# Patient Record
Sex: Male | Born: 1940 | Race: White | Hispanic: No | Marital: Married | Smoking: Never smoker
Health system: Southern US, Community
[De-identification: ages and names within clinical notes are randomized; demographics above are authoritative.]

## PROBLEM LIST (undated history)

## (undated) DIAGNOSIS — I639 Cerebral infarction, unspecified: Secondary | ICD-10-CM

## (undated) DIAGNOSIS — R0600 Dyspnea, unspecified: Secondary | ICD-10-CM

## (undated) DIAGNOSIS — C679 Malignant neoplasm of bladder, unspecified: Secondary | ICD-10-CM

## (undated) DIAGNOSIS — L309 Dermatitis, unspecified: Secondary | ICD-10-CM

## (undated) DIAGNOSIS — I2510 Atherosclerotic heart disease of native coronary artery without angina pectoris: Secondary | ICD-10-CM

## (undated) DIAGNOSIS — M1990 Unspecified osteoarthritis, unspecified site: Secondary | ICD-10-CM

## (undated) DIAGNOSIS — J45909 Unspecified asthma, uncomplicated: Secondary | ICD-10-CM

## (undated) DIAGNOSIS — I10 Essential (primary) hypertension: Secondary | ICD-10-CM

## (undated) DIAGNOSIS — U071 COVID-19: Secondary | ICD-10-CM

## (undated) DIAGNOSIS — J189 Pneumonia, unspecified organism: Secondary | ICD-10-CM

## (undated) DIAGNOSIS — G4730 Sleep apnea, unspecified: Secondary | ICD-10-CM

## (undated) HISTORY — PX: ANKLE SURGERY: SHX546

## (undated) HISTORY — PX: COLONOSCOPY: SHX174

## (undated) HISTORY — PX: PROSTATE SURGERY: SHX751

## (undated) HISTORY — PX: TONSILLECTOMY: SUR1361

## (undated) HISTORY — PX: REPLACEMENT TOTAL KNEE BILATERAL: SUR1225

## (undated) HISTORY — PX: BACK SURGERY: SHX140

## (undated) HISTORY — PX: MOHS SURGERY: SUR867

---

## 1958-10-01 HISTORY — PX: BACK SURGERY: SHX140

## 2008-10-01 DIAGNOSIS — C801 Malignant (primary) neoplasm, unspecified: Secondary | ICD-10-CM

## 2008-10-01 HISTORY — DX: Malignant (primary) neoplasm, unspecified: C80.1

## 2010-10-01 HISTORY — PX: PROSTATE SURGERY: SHX751

## 2011-09-17 ENCOUNTER — Ambulatory Visit: Payer: Self-pay | Admitting: General Practice

## 2011-10-03 ENCOUNTER — Inpatient Hospital Stay: Payer: Self-pay | Admitting: General Practice

## 2011-10-04 LAB — BASIC METABOLIC PANEL
Anion Gap: 11 (ref 7–16)
BUN: 14 mg/dL (ref 7–18)
Calcium, Total: 8.5 mg/dL (ref 8.5–10.1)
Chloride: 100 mmol/L (ref 98–107)
Co2: 29 mmol/L (ref 21–32)
Osmolality: 282 (ref 275–301)

## 2011-10-04 LAB — PLATELET COUNT: Platelet: 207 10*3/uL (ref 150–440)

## 2011-10-05 LAB — BASIC METABOLIC PANEL
BUN: 18 mg/dL (ref 7–18)
Co2: 30 mmol/L (ref 21–32)
Creatinine: 0.94 mg/dL (ref 0.60–1.30)
EGFR (Non-African Amer.): >60
Glucose: 114 mg/dL — ABNORMAL HIGH (ref 65–99)
Potassium: 3.3 mmol/L — ABNORMAL LOW (ref 3.5–5.1)
Sodium: 141 mmol/L (ref 136–145)

## 2011-10-05 LAB — HEMOGLOBIN: HGB: 11.4 g/dL — ABNORMAL LOW (ref 13.0–18.0)

## 2011-10-05 LAB — PLATELET COUNT: Platelet: 162 10*3/uL (ref 150–440)

## 2011-10-06 LAB — BASIC METABOLIC PANEL
BUN: 13 mg/dL (ref 7–18)
Chloride: 100 mmol/L (ref 98–107)
EGFR (African American): >60
EGFR (Non-African Amer.): >60
Glucose: 95 mg/dL (ref 65–99)
Osmolality: 279 (ref 275–301)
Potassium: 3.2 mmol/L — ABNORMAL LOW (ref 3.5–5.1)
Sodium: 140 mmol/L (ref 136–145)

## 2012-02-05 ENCOUNTER — Ambulatory Visit: Payer: Self-pay | Admitting: General Practice

## 2012-02-05 LAB — URINALYSIS, COMPLETE
Bilirubin,UR: NEGATIVE
Glucose,UR: NEGATIVE mg/dL (ref 0–75)
Leukocyte Esterase: NEGATIVE
Protein: NEGATIVE
RBC,UR: 2 /HPF (ref 0–5)
Squamous Epithelial: 1
WBC UR: 1 /HPF (ref 0–5)

## 2012-02-05 LAB — BASIC METABOLIC PANEL
Anion Gap: 7 (ref 7–16)
BUN: 18 mg/dL (ref 7–18)
Calcium, Total: 9.4 mg/dL (ref 8.5–10.1)
Chloride: 103 mmol/L (ref 98–107)
Creatinine: 0.69 mg/dL (ref 0.60–1.30)
EGFR (African American): >60
EGFR (Non-African Amer.): >60
Glucose: 102 mg/dL — ABNORMAL HIGH (ref 65–99)
Osmolality: 283 (ref 275–301)
Potassium: 3.7 mmol/L (ref 3.5–5.1)

## 2012-02-05 LAB — CBC
MCH: 30 pg (ref 26.0–34.0)
MCV: 89 fL (ref 80–100)
Platelet: 225 10*3/uL (ref 150–440)
RBC: 4.74 10*6/uL (ref 4.40–5.90)
RDW: 14.2 % (ref 11.5–14.5)

## 2012-02-05 LAB — PROTIME-INR
INR: 0.9
Prothrombin Time: 12.9 secs (ref 11.5–14.7)

## 2012-02-05 LAB — APTT: Activated PTT: 31.4 secs (ref 23.6–35.9)

## 2012-02-05 LAB — SEDIMENTATION RATE: Erythrocyte Sed Rate: 4 mm/hr (ref 0–20)

## 2012-02-07 LAB — URINE CULTURE

## 2012-02-18 ENCOUNTER — Inpatient Hospital Stay: Payer: Self-pay | Admitting: General Practice

## 2012-02-19 LAB — BASIC METABOLIC PANEL
Anion Gap: 9 (ref 7–16)
Chloride: 105 mmol/L (ref 98–107)
EGFR (Non-African Amer.): >60
Glucose: 117 mg/dL — ABNORMAL HIGH (ref 65–99)
Osmolality: 282 (ref 275–301)
Sodium: 140 mmol/L (ref 136–145)

## 2012-02-19 LAB — HEMOGLOBIN: HGB: 12 g/dL — ABNORMAL LOW (ref 13.0–18.0)

## 2012-02-20 LAB — HEMOGLOBIN: HGB: 11.3 g/dL — ABNORMAL LOW (ref 13.0–18.0)

## 2012-02-20 LAB — BASIC METABOLIC PANEL
Calcium, Total: 7.7 mg/dL — ABNORMAL LOW (ref 8.5–10.1)
Chloride: 105 mmol/L (ref 98–107)
Co2: 29 mmol/L (ref 21–32)
Creatinine: 0.77 mg/dL (ref 0.60–1.30)
EGFR (Non-African Amer.): >60
Osmolality: 283 (ref 275–301)
Sodium: 141 mmol/L (ref 136–145)

## 2012-02-21 LAB — BASIC METABOLIC PANEL
Anion Gap: 9 (ref 7–16)
BUN: 13 mg/dL (ref 7–18)
Chloride: 101 mmol/L (ref 98–107)
Creatinine: 0.69 mg/dL (ref 0.60–1.30)
EGFR (African American): >60
EGFR (Non-African Amer.): >60
Glucose: 97 mg/dL (ref 65–99)
Osmolality: 279 (ref 275–301)
Potassium: 3.5 mmol/L (ref 3.5–5.1)
Sodium: 140 mmol/L (ref 136–145)

## 2012-06-23 DIAGNOSIS — N200 Calculus of kidney: Secondary | ICD-10-CM | POA: Insufficient documentation

## 2012-09-11 ENCOUNTER — Ambulatory Visit: Payer: Self-pay | Admitting: Internal Medicine

## 2012-12-18 ENCOUNTER — Ambulatory Visit: Payer: Self-pay | Admitting: Gastroenterology

## 2013-09-14 ENCOUNTER — Ambulatory Visit: Payer: Self-pay | Admitting: Specialist

## 2014-03-19 ENCOUNTER — Ambulatory Visit: Payer: Self-pay | Admitting: Gastroenterology

## 2014-03-22 LAB — PATHOLOGY REPORT

## 2014-07-12 ENCOUNTER — Inpatient Hospital Stay: Payer: Self-pay | Admitting: Internal Medicine

## 2014-07-12 LAB — COMPREHENSIVE METABOLIC PANEL
Albumin: 3 g/dL — ABNORMAL LOW (ref 3.4–5.0)
Alkaline Phosphatase: 52 U/L
Anion Gap: 7 (ref 7–16)
BUN: 25 mg/dL — AB (ref 7–18)
Bilirubin,Total: 0.4 mg/dL (ref 0.2–1.0)
CREATININE: 0.78 mg/dL (ref 0.60–1.30)
Calcium, Total: 7.7 mg/dL — ABNORMAL LOW (ref 8.5–10.1)
Chloride: 111 mmol/L — ABNORMAL HIGH (ref 98–107)
Co2: 27 mmol/L (ref 21–32)
GLUCOSE: 100 mg/dL — AB (ref 65–99)
Osmolality: 293 (ref 275–301)
POTASSIUM: 3.5 mmol/L (ref 3.5–5.1)
SGOT(AST): 21 U/L (ref 15–37)
SGPT (ALT): 25 U/L
Sodium: 145 mmol/L (ref 136–145)
TOTAL PROTEIN: 5.6 g/dL — AB (ref 6.4–8.2)

## 2014-07-12 LAB — CBC
HCT: 24.9 % — AB (ref 40.0–52.0)
HGB: 8.2 g/dL — ABNORMAL LOW (ref 13.0–18.0)
MCH: 31.8 pg (ref 26.0–34.0)
MCHC: 33 g/dL (ref 32.0–36.0)
MCV: 96 fL (ref 80–100)
Platelet: 189 10*3/uL (ref 150–440)
RBC: 2.58 10*6/uL — ABNORMAL LOW (ref 4.40–5.90)
RDW: 13.6 % (ref 11.5–14.5)
WBC: 7.1 10*3/uL (ref 3.8–10.6)

## 2014-07-12 LAB — HEMOGLOBIN: HGB: 7.5 g/dL — ABNORMAL LOW (ref 13.0–18.0)

## 2014-07-13 LAB — BASIC METABOLIC PANEL
Anion Gap: 7 (ref 7–16)
BUN: 23 mg/dL — AB (ref 7–18)
CALCIUM: 7.5 mg/dL — AB (ref 8.5–10.1)
Chloride: 112 mmol/L — ABNORMAL HIGH (ref 98–107)
Co2: 25 mmol/L (ref 21–32)
Creatinine: 0.71 mg/dL (ref 0.60–1.30)
Glucose: 96 mg/dL (ref 65–99)
Osmolality: 290 (ref 275–301)
Potassium: 3.5 mmol/L (ref 3.5–5.1)
Sodium: 144 mmol/L (ref 136–145)

## 2014-07-13 LAB — CBC WITH DIFFERENTIAL/PLATELET
BASOS ABS: 0.1 10*3/uL (ref 0.0–0.1)
Basophil %: 0.6 %
EOS ABS: 0.1 10*3/uL (ref 0.0–0.7)
EOS PCT: 1.4 %
HCT: 21.2 % — AB (ref 40.0–52.0)
HGB: 7.1 g/dL — AB (ref 13.0–18.0)
Lymphocyte #: 1.1 10*3/uL (ref 1.0–3.6)
Lymphocyte %: 13.5 %
MCH: 32.3 pg (ref 26.0–34.0)
MCHC: 33.6 g/dL (ref 32.0–36.0)
MCV: 96 fL (ref 80–100)
MONO ABS: 0.9 x10 3/mm (ref 0.2–1.0)
MONOS PCT: 10.6 %
Neutrophil #: 6.1 10*3/uL (ref 1.4–6.5)
Neutrophil %: 73.9 %
Platelet: 160 10*3/uL (ref 150–440)
RBC: 2.2 10*6/uL — AB (ref 4.40–5.90)
RDW: 13.7 % (ref 11.5–14.5)
WBC: 8.3 10*3/uL (ref 3.8–10.6)

## 2014-07-13 LAB — HEMOGLOBIN
HGB: 7.8 g/dL — AB (ref 13.0–18.0)
HGB: 8.2 g/dL — AB (ref 13.0–18.0)
HGB: 9 g/dL — AB (ref 13.0–18.0)

## 2014-07-14 LAB — CBC WITH DIFFERENTIAL/PLATELET
BASOS PCT: 0.9 %
Basophil #: 0.1 10*3/uL (ref 0.0–0.1)
EOS ABS: 0.2 10*3/uL (ref 0.0–0.7)
Eosinophil %: 3.2 %
HCT: 24 % — ABNORMAL LOW (ref 40.0–52.0)
HGB: 7.9 g/dL — ABNORMAL LOW (ref 13.0–18.0)
Lymphocyte #: 1.6 10*3/uL (ref 1.0–3.6)
Lymphocyte %: 25.1 %
MCH: 31.3 pg (ref 26.0–34.0)
MCHC: 32.8 g/dL (ref 32.0–36.0)
MCV: 95 fL (ref 80–100)
MONOS PCT: 13.7 %
Monocyte #: 0.8 x10 3/mm (ref 0.2–1.0)
NEUTROS ABS: 3.5 10*3/uL (ref 1.4–6.5)
NEUTROS PCT: 57.1 %
Platelet: 172 10*3/uL (ref 150–440)
RBC: 2.51 10*6/uL — ABNORMAL LOW (ref 4.40–5.90)
RDW: 13.9 % (ref 11.5–14.5)
WBC: 6.2 10*3/uL (ref 3.8–10.6)

## 2014-07-14 LAB — BASIC METABOLIC PANEL
Anion Gap: 5 — ABNORMAL LOW (ref 7–16)
BUN: 11 mg/dL (ref 7–18)
CALCIUM: 7.5 mg/dL — AB (ref 8.5–10.1)
CHLORIDE: 111 mmol/L — AB (ref 98–107)
CO2: 29 mmol/L (ref 21–32)
CREATININE: 0.69 mg/dL (ref 0.60–1.30)
EGFR (Non-African Amer.): >60
Glucose: 91 mg/dL (ref 65–99)
Osmolality: 288 (ref 275–301)
POTASSIUM: 3.5 mmol/L (ref 3.5–5.1)
SODIUM: 145 mmol/L (ref 136–145)

## 2014-07-14 LAB — HEMOGLOBIN: HGB: 8.8 g/dL — ABNORMAL LOW (ref 13.0–18.0)

## 2015-01-22 NOTE — Consult Note (Signed)
Brief Consult Note: Diagnosis: LGI bleed.   Patient was seen by consultant.   Consult note dictated.   Recommend further assessment or treatment.   Comments: Lower GI bleed.  Recent colonoscopy 03/2014 with left sided diverticulosis and one small TA.  This is likely diverticular bleed but seems to have more stuttering course than would be expected.   Recs: - obtain tagged scan if evidence of active bleeding overnight - follow hgb, transfuse for Hgb < 8 - monitor hemodynamics. - no plans for colonoscopy given low utility in stopping lower GI bleeds and recent colonoscopy 03/2014.  Electronic Signatures: Arther Dames (MD)  (Signed 12-Oct-15 17:21)  Authored: Brief Consult Note   Last Updated: 12-Oct-15 17:21 by Arther Dames (MD)

## 2015-01-22 NOTE — H&P (Signed)
PATIENT NAME:  David Jennings, SUR MR#:  [id number] DATE OF BIRTH:  05/13/1941  PRIMARY CARE PHYSICIAN:  [name], MD  CHIEF COMPLAINT: Rectal bleeding.   HISTORY OF PRESENTING ILLNESS: A 74 year old Caucasian male patient with history of diverticulosis, colon polyps on meloxicam daily. Presents to the Emergency Room, sent in by his primary care physician after he was noticed to have hypotension with systolic blood pressure into the 90s and tachycardic along with lightheadedness with rectal bleeding over 3 days. The patient mentions that he has had episodes of ups and downs, this starting 3 days back with significant rectal bleeding which seemed to be slowed down, but today he had large rectal bleeding along with lightheadedness, severe fatigue. He went to see his PCP as an urgent evaluation, was sent to the Emergency Room after his hemoglobin was found to be low at 8.8. His last known hemoglobin is 14.8 from December 2015.  He has not had any vomiting. He complains of some soreness in the left lower quadrant, but no pain. He did have a similar episode about 4 years back in [place] before he moved here when his hemoglobin was 5.5. Was given 2 units of packed RBCs and was admitted to the hospital.   The patient had a recent colonoscopy in June 2015 by Dr. Candace Cruise showing diverticulosis and colon polyps with adenomatous polyp, but no malignancy.   He does take aspirin at home, but has not taken this in 2 days since his bleeding started.   PAST MEDICAL HISTORY: 1.  Hypertension.  2.  Prostate cancer.  3.  Sleep apnea.  4.  Asthma.   PAST SURGICAL HISTORY: Bilateral knee replacements, prostate surgery, and lumbar fusion surgery.   SOCIAL HISTORY: The patient does not smoke. Rare alcohol use. No illicit drug use. Used to work for [employer] in the past, but presently is retired and moved from [place] 3 years back.   FAMILY HISTORY: No family history of colon cancer.   ALLERGIES: No known drug allergies.    HOME MEDICATIONS: Include: 1.  Aspirin 81 mg daily.  2.  Amlodipine 5 mg daily.  3.  Atrovent 1 puff inhaled 4 times a day as needed. 4.  Detrol LA 4 mg daily. 5.  Docusate 240 mg daily.  6.  Hydrochlorothiazide-losartan 25/100 one tablet once a day.  7.  Meloxicam 7.5 mg oral 2 times a day.  8.  Montelukast 10 mg oral once a day. 9.  Multivitamin 1 tablet daily.  10.  Niacin 500 mg 1 tablet oral once a day. 11.  Spiriva 18 mcg inhaled daily. 12.  Symbicort 2 puffs inhaled 2 times a day.   REVIEW OF SYSTEMS:   CONSTITUTIONAL:  Complains of severe fatigue, weakness.  EYES: No blurred vision, pain or redness.  ENT: No tinnitus, ear pain, hearing loss.  RESPIRATORY: No cough, wheeze, or hemoptysis.  CARDIOVASCULAR: No chest pain, orthopnea, edema.  GASTROINTESTINAL: No nausea, vomiting, diarrhea. Does have bright red blood per rectum along with some melena.  GENITOURINARY: No dysuria, hematuria, frequency.  ENDOCRINE: No polyuria, nocturia, thyroid problems.  HEMATOLOGIC AND LYMPHATIC: Does have acute blood loss anemia.  INTEGUMENTARY: No acne, rash, lesion.  MUSCULOSKELETAL: Has arthritis.  NEUROLOGIC: No focal numbness, weakness, seizure. Has generalized weakness.  PSYCHIATRIC: For anxiety or depression.   PHYSICAL EXAMINATION: VITAL SIGNS: Shows temperature 98.1, pulse of 95, blood pressure of 96/58, presently at 120/70, saturating 98% on room air.  GENERAL: Moderately built Caucasian male patient lying in bed,  seems comfortable, conversational, cooperative with exam. PSYCHIATRIC:  Alert and oriented x 3. Mood and affect appropriate. Judgment intact.  HEENT: Atraumatic, normocephalic. Oral mucosa moist and pink. External ears and nose normal. No pallor. No icterus. Pupils bilaterally equal and react to light.  NECK: Supple. No thyromegaly. No palpable lymph nodes. Trachea midline. No carotid bruit or JVD.  CARDIOVASCULAR: S1, S2, without any murmurs. Pulses 2+. No  edema. RESPIRATORY: Normal work of breathing, clear to auscultation on both sides. GASTROINTESTINAL: Soft abdomen, nontender. Bowel sounds present. No organomegaly palpable. SKIN:  Warm and dry. No petechiae, no rash, or ulcers.  MUSCULOSKELETAL:  No joint swelling, redness, effusion of large joints. Normal muscle tone.  NEUROLOGIC: Motor strength 5/5 in upper and lower extremities.  Sensory function intact all over.  LYMPHATIC: No cervical lymphadenopathy.   LABORATORY DATA: Showed glucose of 100, BUN 25, creatinine 0.78, sodium 142, potassium 3.5, chloride 111. AST, ALT, alkaline phosphatase, bilirubin normal. WBC 7.1, hemoglobin 8.2, platelets of 189,000 with MCV 96.   ASSESSMENT AND PLAN: 1.  Acute gastrointestinal bleed likely lower gastrointestinal bleed, but the patient does mention that he had an episode of melena and he is on meloxicam b.i.d. chronically. At this point most likely lower gastrointestinal bleed, but cannot rule out upper gastrointestinal bleed. Will check every 6 hour hemoglobin and bolus another liter of normal saline stat at this point secondary to his dizziness, tachycardia. I have obtained consent for blood transfusion if needed. We will type and screen, and hold 2 units of packed red blood cells. Case has been discussed with Dr. Rayann Heman. If he does have any further acute bleed, he will need a bleeding scan. Depending on his progress, he might need a colonoscopy or both colonoscopy and upper endoscopy. The patient will be placed on Protonix drip. Hold meloxicam and aspirin.  2.  Acute blood loss anemia secondary to the gastrointestinal bleed. Monitor closely, transfuse as needed.  3.  Hypertension. Hold medications secondary to his low blood pressure.  4.  Asthma. Continue home inhalers and nebulizer as needed.  5.  Deep vein thrombosis prophylaxis with sequential compression devices.  6. CODE STATUS: Full code.   Critical care time spent on this case was 40  minutes.   ____________________________ Leia Alf Meaghan Whistler, MD srs:LT D: 07/12/2014 18:15:23 ET T: 07/12/2014 19:00:20 ET JOB#: [id number]  cc: Alveta Heimlich R. Khristine Verno, MD, <Dictator> [name], MD Lupita Dawn. Candace Cruise, MD Neita Carp MD ELECTRONICALLY SIGNED 07/14/2014 17:04

## 2015-01-22 NOTE — Discharge Summary (Signed)
PATIENT NAME:  David Jennings, David Jennings MR#:  [id number] DATE OF BIRTH:  1941/06/30  DATE OF ADMISSION:  07/12/2014 DATE OF DISCHARGE:  07/14/2014  PRESENTING COMPLAINT: Rectal bleeding.   DISCHARGE DIAGNOSIS:  Suspected diverticular bleed.    CODE STATUS: Full code.   MEDICATIONS:  1. Hydrochlorothiazide/losartan 25/100 one p.o. daily.  2. Meloxicam 7.5 mg b.i.d.  3. Detrol LA 4 mg extended release p.o. daily at bedtime.  4. Singulair 10 mg daily.  5. Symbicort 160/4.5 two puffs b.i.d.  6. Spiriva 18 mcg inhalation 1 puff daily.  7. Amlodipine 5 mg daily.  8. Docusate 240 mg capsule p.o. daily.  9. Atrovent 1 puff 4 times a day as needed.  10. Ipratropium DuoNebs as needed.  11. Ipratropium nebulizer as needed.  12. Multivitamin p.o. daily.  13. Niacin 500 mg p.o. daily.  14. The patient advised to stop his aspirin until further instructions.   FOLLOWUP:   1.  Follow up with Dr. Baldemar Lenis in 1-2 weeks.  2.  Follow up with Dr. Rayann Heman in 2-4 weeks.   LABORATORY DATA:  Hemoglobin at discharge was 8.8.   HOSPITAL COURSE:  [name] is a 74 year old Caucasian gentleman who has history of asthma, high blood pressure admitted with:   1.  Lower GI bleed likely diverticular. EGD in 2014 was normal, colonoscopy June 2015 showed  large diverticula. Bleeding, scan was negative. The patient received 2 units, his hemoglobin was 8.8. Conservative management was opted. He was followed by Dr. Rayann Heman and will follow up with him as outpatient.  2.  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  Hemoglobin on admission was 8 and after 2 units came up to 8.8. Bleeding resolved, the patient was doing well.  3.  Hypertension. Resumed home medications.  4.  Asthma. Continue Symbicort, Singulair, and p.r.n. nebulizers as needed.  5.  DVT prophylaxis, TEDs and SCDs were used.   TIME SPENT: 40 minutes.      ____________________________ Hart Rochester Posey Pronto, MD sap:bu D: 07/19/2014 12:24:22  ET T: 07/19/2014 14:55:54 ET JOB#: [id number]  cc: Winfred Redel A. Posey Pronto, MD, <Dictator> Ilda Basset MD ELECTRONICALLY SIGNED 07/19/2014 15:57

## 2015-01-22 NOTE — Consult Note (Signed)
PATIENT NAME:  David Jennings, David Jennings MR#:  [id number] DATE OF BIRTH:  11-May-1941  DATE OF CONSULTATION:  07/13/2014  CONSULTING PHYSICIAN:  Arther Dames, MD  REFERRING PHYSICIAN: Dr. Tressia Miners.  REASON FOR CONSULTATION: Anemia, lower GI bleed.   HISTORY OF PRESENT ILLNESS: David Jennings is a 74 year old male with a past medical history notable for hypertension, sleep apnea, asthma, who presented to the Emergency Room for evaluation of lower GI bleeding. David Jennings reports that approximally Friday night, which would be 3 days prior to the presentation he developed onset of bright red blood per rectum. He had several episodes of what he describes as a large amount of bright red blood. He then did not have any more bleeding until about 24 hours later. He had another episode. Then again 24 hours later he had several more episodes of bright red blood per rectum. There was a small amount of brown stool mixed in. He also at the same time started feeling weak. He was noted to be tachycardic with some mild hypotension at his PCP.   Of note, David Jennings does report a lower GI bleed several years ago. He thinks he had got several units of blood at that time.   Of note, he did have a colonoscopy in June 2015 by Dr. Candace Cruise which revealed left-sided diverticulosis and also 1 small tubular adenoma. He does not have a family history of GI malignancy.   PAST MEDICAL HISTORY:  1. Hypertension.  2. Sleep apnea.  3. Asthma.  4. Prostate cancer.   PAST SURGICAL HISTORY: Bilateral knee replacement, prostate surgery, lumbar fusion.   SOCIAL HISTORY: He denies tobacco and alcohol to me.   FAMILY HISTORY: No family history of GI malignancy such as colon cancer that he is aware of.   ALLERGIES: NKDA.   HOME MEDICATIONS:  1. Aspirin 81 mg daily.  2. Norvasc 5 mg daily.  3. Atrovent 1 puff 4 times a day as needed.  4. Detrol long-acting 4 mg daily.  5. Docusate 240 mg daily.  6. Hydrochlorothiazide/losartan 25/100  daily.  7. Meloxicam 7.5 b.i.d.  8. Montelukast 10 mg daily.  9. Niacin 500 daily.  10. Spiriva 18 mg inhaled daily.  11. Symbicort 2 puffs b.i.d.   REVIEW OF SYSTEMS:   CONSTITUTIONAL: Positive for fatigue and weakness, some lightheadedness. No weight gain or weight loss.  No fever or chills. HEENT: No oral lesions or sore throat. No vision changes. GASTROINTESTINAL: See HPI.  HEME/LYMPH: No easy bruising or bleeding. CARDIOVASCULAR: No chest pain or dyspnea on exertion. GENITOURINARY: No hematuria. INTEGUMENTARY: No rashes or pruritus PSYCHIATRIC: No depression/anxiety.  ENDOCRINE: No heat/cold intolerance, no hair loss or skin changes. ALLERGIC/IMMUNOLOGIC: Negative for hives. RESPIRATORY: No cough, no shortness of breath.  MUSCULOSKELETAL: No joint swelling or muscle pain.  PHYSICAL EXAMINATION: VITALS SIGNS: Currently, his temperature is 98.3. Pulse is 86, blood pressure is 136/80, pulse oximetry is 96% on room air.  GENERAL: Alert and oriented times 4.  No acute distress. Appears stated age. HEENT: Normocephalic/atraumatic. Extraocular movements are intact. Anicteric. NECK: Soft, supple. JVP appears normal. No adenopathy. CHEST: Clear to auscultation. No wheeze or crackle. Respirations unlabored. HEART: Regular. No murmur, rub, or gallop.  Normal S1 and S2. ABDOMEN: Soft, nontender, nondistended.  Normal active bowel sounds in all four quadrants.  No organomegaly. No masses EXTREMITIES: No swelling, well perfused. SKIN: No rash or lesion. Skin color, texture, turgor normal. NEUROLOGICAL: Grossly intact. PSYCHIATRIC: Normal tone and affect. MUSCULOSKELETAL: No joint swelling or erythema.  LABORATORY DATA: His sodium 144, potassium 3.5, creatinine 0.71, BUN 23, glucose 96. Liver enzymes are normal except albumin is 3. White count is 8.3. On presentation, hemoglobin was 8.2. That trended down to 7.5. After 1 unit it went to 7.8, and then to 7.1. Platelets are 160,000. He did  have a tagged red blood cell scan that was negative.   ASSESSMENT: PROBLEM: Lower gastrointestinal bleed: This is likely a diverticular bleed. He did have colonoscopy several months ago which did confirm multiple diverticula in the left colon.   RECOMMENDATIONS:  1. Since he just had a colonoscopy several months ago, there is no indication to repeat a colonoscopy at this time. He has already had a tagged red blood cell scan this morning, which was negative. I do suspect the bleeding has likely resolved.  2. Continue to monitor hemoglobin until stable.  3. Monitor hemodynamics.  4. Transfuse for a hemoglobin less than 7.5.  Thank you for this consult.    ____________________________ Arther Dames, MD mr:lt D: 07/13/2014 12:10:02 ET T: 07/13/2014 12:53:28 ET JOB#: [id number]  cc: Arther Dames, MD, <Dictator> Mellody Life MD ELECTRONICALLY SIGNED 07/14/2014 11:58

## 2015-01-23 NOTE — Op Note (Signed)
PATIENT NAME:  David Jennings, David Jennings MR#:  [id number] DATE OF BIRTH:  12/14/1940  DATE OF PROCEDURE:  10/03/2011  PREOPERATIVE DIAGNOSIS: Degenerative arthrosis of the right knee.   POSTOPERATIVE DIAGNOSIS: Degenerative arthrosis of the right knee.   PROCEDURE PERFORMED: Right total knee arthroplasty using computer-assisted navigation.   SURGEON: Skip Estimable, M.D.   ASSISTANT: Vance Peper, PA-C (required to maintain retraction throughout the procedure)   ANESTHESIA: Femoral nerve block and spinal.   ESTIMATED BLOOD LOSS: 250 mL   FLUIDS REPLACED: 2200 mL of crystalloid.   TOURNIQUET TIME: 103 minutes.   SOFT TISSUE RELEASES: Anterior cruciate ligament, posterior cruciate ligament, deep medial collateral ligament, and patellofemoral ligament.   DRAINS: Two medium drains to reinfusion system.   IMPLANTS UTILIZED: DePuy PFC Sigma size 5 posterior stabilized femoral component (cemented), size 5 MBT tibial component (cemented), 38 mm three peg oval dome patella (cemented), and a 12.5 mm stabilized rotating platform polyethylene insert.   INDICATIONS FOR SURGERY: The patient is a 74 year old male who has then seen for complaints of progressive right knee pain. X-rays demonstrated severe degenerative changes in tricompartmental fashion with relative varus deformity. After a discussion of the risks and benefits of surgical intervention, the patient expressed his understanding of the risks and benefits and agreed with plans for surgical intervention.   PROCEDURE IN DETAIL: The patient was brought into the Operating Room and, after adequate femoral nerve block and spinal anesthesia was achieved, a tourniquet was placed on the patient's upper right thigh. The patient's right knee and leg were cleaned and prepped with alcohol and DuraPrep and draped in the usual sterile fashion. A "timeout" was performed as per usual protocol. The right lower extremity was exsanguinated using an Esmarch, and the  tourniquet was inflated to 300 mmHg. Anterior longitudinal incision was made followed by a standard mid vastus approach. A large effusion was evacuated. Deep fibers of the medial collateral ligament were elevated in a subperiosteal fashion off the medial flare of the tibia so as to maintain a continuous soft tissue sleeve. The patella was subluxed laterally and the patellofemoral ligament was incised. Inspection of the knee demonstrated severe degenerative changes most notably to the medial compartment. Erosive changes were noted to the medial tibial plateau. Prominent osteophytes were debrided. The anterior and posterior cruciate ligaments were excised. Two 4.0 Schanz pins were inserted into the femur and into the tibia for attachment of the array of spheres used for computer-assisted navigation. Hip center was identified using circumduction technique. Distal landmarks, proximal tibia, and distal femur were mapped using the computer. The distal femoral cutting guide was positioned using computer-assisted navigation so as to achieve a 5 degree distal valgus cut. Cut was performed and verified using the computer. The distal femur was sized and it was felt that a size 5 femoral component was appropriate. A size 5 cutting guide was positioned using computer-assisted navigation and the anterior cut was performed and verified using the computer. This was followed by completion of the posterior and chamfer cuts. Femoral cutting guide for central box was then positioned and the central box cut was performed.   Attention was then directed to the proximal tibia. The medial and lateral menisci were excised. The extramedullary tibial cutting guide was positioned using computer-assisted navigation so as to achieve 0 degree varus valgus alignment and 0 degree posterior slope. Cut was performed and verified using the computer. The proximal tibia was sized and it was felt that a size 5 tibial tray was appropriate. Tibial  and  femoral trials were inserted followed by insertion of first a 10 and subsequently a 12.5 mm polyethylene insert. Excellent mediolateral soft tissue balancing was appreciated both in full extension and in 90 degrees of flexion. Finally, patella was cut and prepared so as to accommodate a 38 mm three peg oval dome patella. Patellar trial was placed and the knee was placed through a range of motion with good patellar tracking appreciated.   The femoral trial was removed. Central post hole for the tibial component was reamed followed by insertion of a keel punch. Tibial trial was then removed. The cut surfaces of bone were irrigated with copious amounts of normal saline with antibiotic solution using pulsatile lavage and then suctioned dry. Polymethyl methacrylate cement was prepared in the usual fashion using a vacuum mixer. Cement was applied to the cut surface of the tibia as well as along the undersurface of a size 5 MBT tibial component. The tibial component was positioned and impacted into place. Excess cement was removed using freer elevators. Cement was then applied to the cut surface of the femur as well as along the posterior flanges of size 5 posterior stabilized femoral component. Femoral component was positioned and impacted into place. Excess cement was removed using freer elevators. A 12.5 mm polyethylene trial was inserted and the knee was brought in full extension with steady axial compression applied. Finally, cement was applied to the backside of a 38 mm three peg oval dome patella and the patellar component was positioned and patellar clamp applied. Excess cement was removed using freer elevators.   After adequate curing of cement, the tourniquet was deflated after total tourniquet time of 103 minutes. Hemostasis was achieved using electrocautery, although slow ooze from the tissue was noted. The knee was irrigated with copious amounts of normal saline with antibiotic solution using pulsatile  lavage and then suctioned dry. The knee was inspected for any residual cement debris. 30 mL of 0.25% Marcaine with epinephrine was injected along the posterior capsule. A 12.5 mm stabilized rotating platform polyethylene insert was inserted and the knee was placed through a range of motion. Excellent mediolateral soft tissue balancing was appreciated both in full extension and in 90 degrees of flexion. Excellent patellar tracking was appreciated. Two medium drains were placed in the wound bed and brought out through a separate stab incision to be attached to a reinfusion system. The medial parapatellar portion of the incision was reapproximated using interrupted sutures of #1 Vicryl. The subcutaneous tissue was approximated in layers using first #0 Vicryl followed by 2-0 Vicryl. Skin was closed with skin staples. Sterile dressing was applied.   The patient tolerated the procedure well. He was transported to the recovery room in stable condition.    ____________________________ Laurice Record. Holley Bouche., MD jph:rbg D: 10/03/2011 17:40:09 ET T: 10/04/2011 09:36:01 ET JOB#: [id number]  cc: Jeneen Rinks P. Holley Bouche., MD, <Dictator> Laurice Record Holley Bouche MD ELECTRONICALLY SIGNED 10/04/2011 18:41

## 2015-01-23 NOTE — Op Note (Signed)
PATIENT NAME:  David Jennings, David Jennings MR#:  [id number] DATE OF BIRTH:  10/03/1940  DATE OF PROCEDURE:  02/18/2012  PROCEDURE: Left femoral nerve block.   SURGEON: Tachina Spoonemore P. Phineas Douglas, MD    INDICATION: To help this patient with postoperative pain about to have a left total knee arthroplasty by Dr. Skip Estimable.   DESCRIPTION OF PROCEDURE: The risks of general anesthesia, spinal block, and left femoral nerve block were discussed with the patient in Same Day Surgery preoperatively. He consented to all three but wished to proceed with a spinal block and a left femoral nerve block for postoperative pain. This was requested by both the surgeon and the patient. He was brought to the PAC-U preoperatively and the usual monitors were applied. He was placed on nasal cannula oxygen. He was lightly sedated with a total of 4 mg of Versed intravenously. He was still awake, talkative, and in good verbal communication with [place] but much more comfortable. A time-out was done. He had a Betadine prep of his left upper thigh and groin area x3 and a sterile technique was used. A 2-inch 22-gauge Stimuplex needle and monitor were used with 0.7 milliamps of output. The usual landmarks were observed. The needle was advanced approximately 1 inch lateral to the left femoral artery pulsation. We had good left thigh movement on the first pass. There was no heme or paresthesias. He had a total of 30 mL of 0.25% bupivacaine with 1:400,000 of epinephrine injected incrementally. He tolerated the block without problem or complication. He was also given 10 mg of Decadron IV after the block was placed in PAC-U in an effort to increase the duration of the femoral nerve block.   ____________________________ Werner Lean. Phineas Douglas, MD spp:drc D: 02/18/2012 10:08:02 ET T: 02/18/2012 10:26:00 ET JOB#: [id number]  cc: Nicki Reaper P. Phineas Douglas, MD, <Dictator> Lucilla Edin MD ELECTRONICALLY SIGNED 02/18/2012 10:48

## 2015-01-23 NOTE — Discharge Summary (Signed)
PATIENT NAME:  David Jennings, David Jennings MR#:  [id number] DATE OF BIRTH:  08-15-1941  DATE OF ADMISSION:  10/03/2011 DATE OF DISCHARGE:  10/06/2011  ADMITTING DIAGNOSIS: Degenerative arthrosis of the right knee.   DISCHARGE DIAGNOSIS: Degenerative arthrosis of the right knee.   HISTORY: The patient is a 74 year old who has been followed at [facility] for progression of right knee pain. He reported approximately a two year history of knee pain. The patient states that over the years he has had multiple cortisone injections with minimal improvement. The patient also received a Synvisc injection with only short-term improvement in his condition. The patient was noted to have difficulty with ambulation and states that after long periods of walking it caused increased discomfort which he describes as being sharp and dull pain to the medial aspect of the knee. The patient was having difficulty going up and down steps as well as getting up and down out of a chair. He was wearing a brace prior to surgery. He states that the pain had progressed to the point that it was significantly interfering with his activities of daily living. X-rays taken in the clinic showed narrowing of the medial cartilage space with associated varus alignment. There was osteophyte as well as subchondral sclerosis noted. After discussion of the risks and benefits of surgical intervention, the patient expressed his understanding of the risks and benefits and agreed with plans for surgical intervention.   PROCEDURE: Right total knee arthroplasty using computer-assisted navigation.   ANESTHESIA: Femoral nerve block with spinal.   SOFT TISSUE RELEASE: Anterior cruciate ligament, posterior cruciate ligament, and deep medial collateral ligaments as well as the patellofemoral ligament.   IMPLANTS UTILIZED: DePuy PFC Sigma size 5 posterior stabilized femoral component (cemented), size 5 MBT tibial component (cemented), 33 mm three peg oval dome  patella (cemented), and a 12.5 mm stabilized rotating platform polyethylene insert.   HOSPITAL COURSE: The patient tolerated the procedure very well. He had no complications. He was then taken to the PAC-U where he was stabilized and then transferred to the orthopedic floor. The patient began receiving anticoagulation therapy of Lovenox 30 mg subcutaneous per anesthesia as well as pharmacy protocol. He was fitted with TED stockings bilaterally. These were allowed to be removed one hour per eight hour shift. The right one was applied on day two following removal of the Hemovac and dressing change. He was also fitted with the AV-I compression foot pumps bilaterally set at 130 mmHg his calves have been nontender and free of any evidence of deep venous thromboses; negative Homans sign. Heels were elevated off the bed using rolled towels. Heels have been nontender. There was no tissue breakdown noted.   The patient's vital signs have been stable. He has been afebrile. Hemodynamically he was stable and no transfusions were given other than the Autovac transfusion given the first six hours postoperatively. Laboratory studies were all within normal limits, except for his potassium which dropped to 3.3 on day two and subsequently this was supplemented with Klor-Con twice a day. Followup MET-B was ordered.  Physical therapy was initiated on day one for gait training and transfers. On day one, the patient was noted to ambulate greater than 350 feet and had range of motion from 0 to 80 degrees. He has done extremely well. Occupational therapy was also initiated on day one for activities of daily living and assistive devices.   The patient's IV, Foley and Hemovac were all discontinued on day two along with a dressing change.  The wound was free of any drainage or signs of infection. Polar Care was reapplied to the surgical leg. The patient was noted to be somewhat apprehensive the first day but had calmed down a lot by  the second day after he did his therapy.   The patient is being discharged to home in improved stable condition.   DISCHARGE INSTRUCTIONS: He will continue with home health physical therapy. He may weight bear as tolerated. He will continue to use a walker until cleared by physical therapy to go to a quad cane. He is to continue wearing his TED stockings. These are to be worn during the day but may be removed at night. He is to continue with Polar Care to the surgical leg maintaining a temperature of 40 to 50 degrees Fahrenheit. He is placed on a regular diet. He is to resume his regular medications that he was on prior to admission. He was given a prescription for oxycodone 5 to 10 mg every 4 to 6 hours p.r.n. for pain, as well as Ultram 1 to 2 tablets every 4 to 6 hours p.r.n. for pain, and a prescription for Lovenox 40 mg subcutaneously daily for 14 days and then discontinue and begin taking one 81 mg enteric-coated aspirin per day.   PAST MEDICAL HISTORY:  1. Asthma.  2. Seasonal allergies.  3. Arthritis. 4. Sleep apnea.  5. Hypertension.  6. Hypercholesterolemia.  7. History of cataracts, bilateral eyes.  8. Prostate cancer.  ____________________________ Vance Peper, PA jrw:slb D: 10/05/2011 08:26:26 ET T: 10/08/2011 09:44:03 ET JOB#: [id number]  cc: Vance Peper, PA, <Dictator> Keelan Tripodi PA ELECTRONICALLY SIGNED 10/09/2011 7:43

## 2015-01-23 NOTE — Op Note (Signed)
PATIENT NAME:  David Jennings, David Jennings MR#:  [id number] DATE OF BIRTH:  Dec 20, 1940  DATE OF PROCEDURE:  02/18/2012  PREOPERATIVE DIAGNOSIS: Degenerative arthrosis of the left knee.   POSTOPERATIVE DIAGNOSIS: Degenerative arthrosis of the left knee.   PROCEDURE PERFORMED: Left total knee arthroplasty using computer-assisted navigation.   SURGEON: Laurice Record. Holley Bouche., MD.   ASSISTANTMarland Kitchen Vance Peper, PA-C (required to maintain retraction throughout the procedure)   ANESTHESIA: Femoral nerve block and spinal.   ESTIMATED BLOOD LOSS: 250 mL.   FLUIDS REPLACED: 3100 mL of crystalloid.Marland Kitchen  TOURNIQUET TIME: 93 minutes.    DRAINS: Two medium drains to reinfusion system.   SOFT TISSUE RELEASES: Anterior cruciate ligament, posterior cruciate ligament, deep medial collateral ligament, patellofemoral ligament.   IMPLANTS UTILIZED: DePuy PFC Sigma size 4 posterior stabilized femoral component (cemented), a size 5 MBT tibial component (cemented), 38 mm three-peg oval dome patella (cemented), and a 12.5 mm stabilized rotating platform polyethylene insert.   INDICATIONS FOR SURGERY: The patient is a 74 year old male who has been seen for complaints of progressive left knee pain. X-rays demonstrated severe degenerative changes in tricompartmental fashion with relative varus deformity. After discussion of the risks and benefits of surgical intervention, the patient expressed her understanding of the risks and benefits and agreed with plans for surgical intervention.   PROCEDURE IN DETAIL: The patient was brought into the Operating Room, and after adequate femoral nerve block and spinal anesthesia was achieved, a tourniquet was placed on the patient's upper left thigh. The patient's left knee and leg were cleaned and prepped with alcohol and DuraPrep and draped in the usual sterile fashion. A "timeout" was performed as per usual protocol. The left lower extremity was exsanguinated using an Esmarch, and the tourniquet  was inflated to 300 mmHg. An anterior longitudinal incision was made, followed by a standard mid vastus approach. A moderate effusion was evacuated. The deep fibers of the medial collateral ligament were elevated in a subperiosteal fashion off the medial flare of the tibia so as to maintain a continuous soft tissue sleeve. The patella was subluxed laterally, and the patellofemoral ligament was incised. Inspection of the knee demonstrated severe degenerative changes in a tricompartmental fashion with evidence of eburnated bone noted to the medial compartment. Prominent osteophytes were debrided using a rongeur. Anterior and posterior cruciate ligaments were excised. Two 4.0 mm Schanz pins were inserted into the femur and into the tibia for attachment of the array of spheres used for computer-assisted navigation. Hip center was identified using a circumduction technique. Distal landmarks were mapped using the computer. The distal femur and proximal tibia were mapped using the computer. A distal femoral cutting guide was positioned using computer-assisted navigation so as to achieve a 5-degrees distal valgus cut. Cut was performed and verified using the computer. The distal femur was sized, and it was felt that a size 4 femoral component was appropriate. A size four cutting guide was positioned using computer-assisted navigation, and the anterior cut was performed and verified using the computer. This was followed by completion of the posterior and chamfer cuts. A femoral cutting guide for the central box was positioned and the central box cut was performed.   Attention was then directed to the proximal tibia. Medial and lateral menisci were excised. The extramedullary tibial cutting guide was positioned using computer-assisted navigation so as to achieve 0-degree varus-valgus alignment and 0-degree posterior slope. Cut was performed and verified using the computer. The proximal tibia was sized, and it was felt that  a size 5 tibial tray was appropriate. Tibial and femoral trials were inserted, followed by insertion of first a 10 and subsequently a 12.5 mm polyethylene insert. This allowed for excellent medial and lateral soft tissue balancing both in full extension and in 90 degrees of flexion. Finally, the patella was cut and prepared so as to accommodate a 38 mm three-peg oval dome patella. The patellar trial was placed, and the knee was placed through a range of motion with excellent patellar tracking appreciated.   The femoral component was removed. A central post hole for the tibial component was reamed, followed by insertion of a keel punch. The tibial trial was then removed. The cut surfaces of bone were irrigated with copious amounts of normal saline with antibiotic solution using pulsatile lavage and then suctioned dry. Polymethylmethacrylate cement with gentamicin was mixed in the usual sterile fashion using a vacuum mixer. Cement was applied to the cut surface of the proximal tibia as well as along the undersurface of a size 5 MBT tibial component. The tibial component was positioned and impacted into place. Excess cement was removed using freer elevators. Cement was then applied to the cut surface of the femur as well as along the posterior flanges of a size 4 posterior stabilized femoral component. The femoral component was positioned and impacted into place. Excess cement was removed using freer elevators. A 12.5 mm polyethylene trial was inserted, and the knee was brought in full extension with steady axial compression applied. Finally, cement was applied to the backside of a 38 mm three-peg oval dome patella, and the patellar component was positioned and patellar clamp applied. Excess cement was removed using freer elevators.   After adequate curing of cement, the tourniquet was deflated after a total tourniquet time of 93 minutes. Hemostasis was achieved using electrocautery. The knee was irrigated with  copious amounts of normal saline with antibiotic solution using pulsatile lavage and then suctioned dry. The knee was inspected for any residual cement debris; 30 mL of 0.25% Marcaine with epinephrine was injected along the posterior capsule. A 12.5 mm stabilized rotating platform polyethylene insert was inserted, and the knee was placed through a range of motion. Excellent medial and lateral soft tissue balancing was appreciated both in full extension and in 90 degrees of flexion. Excellent patellar tracking was appreciated.   Two medium drains were placed in the wound bed and brought through a separate stab incision to be attached to a reinfusion system. The medial parapatellar portion of the incision was reapproximated using interrupted sutures of #1 Vicryl. The subcutaneous tissue was approximated in layers using first #0 Vicryl, followed by 2-0 Vicryl. Skin was closed with skin staples. A sterile dressing was applied.   The patient tolerated the procedure well. He was transported to the recovery room in stable condition.   ____________________________ Laurice Record. Holley Bouche., MD jph:cbb D: 02/18/2012 22:58:01 ET T: 02/19/2012 09:34:26 ET JOB#: [id number]  cc: Jeneen Rinks P. Holley Bouche., MD, <Dictator> JAMES P Holley Bouche MD ELECTRONICALLY SIGNED 02/19/2012 21:30

## 2015-01-23 NOTE — Discharge Summary (Signed)
PATIENT NAME:  David Jennings, David Jennings MR#:  [id number] DATE OF BIRTH:  [birth year]  DATE OF ADMISSION:  02/18/2012 DATE OF DISCHARGE:  02/21/2012  ADMITTING DIAGNOSIS: Degenerative arthrosis of the left knee.   DISCHARGE DIAGNOSIS: Degenerative arthrosis of the left knee.   HISTORY OF PRESENT ILLNESS: The patient is a 74 year old who has been followed at [facility] for progression of bilateral knee pain. He had previously underwent a right total knee arthroplasty in January of this year and had done extremely well. However, the left leg continue to give him a lot of pain. He was noted to have episodes of the knee giving way. He states he has not been able to exercise due to the severity of pain. He was having difficulty going up and down steps. He was taking steps one at a time. He had not seen any improvement in his condition despite medication of Synvisc injections. He states that the pain had increased to the point that it was significantly interfering with his activities of daily living. X-rays taken in the [place] showed bone-on-bone to the medial compartment space. He was noted to have medial translation. Some posterior translation was also noted. Osteophyte as well as subchondral sclerosis was noted. After discussion of the risks and benefits of surgical intervention, the patient expressed his understanding of the risks and benefits and agreed for plans for surgical intervention.   PROCEDURE: Left total knee arthroplasty using computer-assisted navigation.   ANESTHESIA: Femoral nerve block with spinal.   SOFT TISSUE RELEASE: Anterior cruciate ligament, posterior cruciate ligament, deep medial collateral ligaments, as well as patellofemoral ligament.   IMPLANTS UTILIZED: DePuy PFC Sigma size 4 posterior stabilized femoral component (cemented), size 5 MBT tibial component (cemented), 38 mm three pegged oval dome patella (cemented), and a 12.5 mm stabilized rotating  platform polyethylene insert.   HOSPITAL COURSE: The patient tolerated the procedure very well. He had no complications. He was then taken to the PAC-U where he was stabilized and then transferred to the orthopedic floor. The patient began receiving anticoagulation therapy of Lovenox 30 mg subcutaneous every 12 hours per anesthesia protocol. He was fitted with TED stockings bilaterally. These are allowed to be removed one hour per eight hour shift. The left one was applied on day two following removal of the Hemovac and dressing change. He was also fitted with AVI compression foot pumps set at 80 mmHg. His calves have been nontender and no evidence of any deep venous thromboses. Heels were elevated off the bed using rolled towels. He has voiced no complaints.   The patient has denied any chest pain or shortness of breath. Vital signs have been stable. He has been afebrile. Hemodynamically he was stable. No transfusions were given other than the Autovac transfusions given the first six hours postoperatively.   The patient began receiving physical therapy on day one for gait training and transfers. On day one he was ambulating greater than 400 feet. Upon being discharged he was ambulating greater than 600 feet. He had over 90 degrees of range of motion. He was able to go up and down four sets of steps. He was independent with bed to chair transfers. Occupational therapy was also initiated on day one for activities of daily living and assistive devices.   The patient's IV, Hemovac, and Foley were all discontinued on day two along with a dressing change. The Polar Care was reapplied to the surgical leg maintaining a temperature of 40 to 50 degrees Fahrenheit.  DISPOSITION: The patient is discharged to home in improved stable condition.   DISCHARGE INSTRUCTIONS: He may weight bear as tolerated. Continue using a walker until cleared by physical therapy to go to a quad cane. He will receive home health PT. He  will continue to wear his TED stockings during the day but may remove these at night. Polar Care to be worn pretty much around-the-clock as much as possible. This is to maintain a temperature of 40 to 50 degrees Fahrenheit. Change dressing as needed. He is placed on a regular diet. He has a follow-up appointment in two weeks. He is to call the clinic sooner if any temperatures of 101.5 or greater or excessive bleeding.   DISCHARGE MEDICATIONS:  He is to resume his regular medications that he was on prior to admission. He was given a prescription for Lovenox 40 mg subcutaneously daily for 14 days, then discontinue and begin taking one 81 mg enteric-coated aspirin. There was also given a prescription for Roxicodone 5 to 10 mg every 4 to 6 hours p.r.n. for pain and Ultram 50 to 100 mg every 4 to 6 hours p.r.n. for pain.   PAST MEDICAL HISTORY:  1. Asthma.  2. Seasonal allergies.  3. Arthritis.  4. Sleep apnea for which he uses a CPAP at night. 5. Hypertension.  6. Hypercholesterolemia.  7. Cataracts bilateral eyes.  8. Prostate cancer.     ____________________________ Vance Peper, PA jrw:rbg D: 02/21/2012 07:13:59 ET T: 02/22/2012 09:10:59 ET JOB#: [id number]  cc: Vance Peper, PA, <Dictator> Arionna Hoggard PA ELECTRONICALLY SIGNED 03/04/2012 8:18

## 2015-11-11 ENCOUNTER — Other Ambulatory Visit: Payer: Self-pay | Admitting: Surgery

## 2015-11-11 DIAGNOSIS — M7581 Other shoulder lesions, right shoulder: Secondary | ICD-10-CM

## 2015-11-11 DIAGNOSIS — M75121 Complete rotator cuff tear or rupture of right shoulder, not specified as traumatic: Secondary | ICD-10-CM

## 2015-11-22 ENCOUNTER — Ambulatory Visit
Admission: RE | Admit: 2015-11-22 | Discharge: 2015-11-22 | Disposition: A | Discharge status: Home / Self Care | Payer: Medicare PPO | Source: Ambulatory Visit | Attending: Surgery | Admitting: Surgery

## 2015-11-22 DIAGNOSIS — M7581 Other shoulder lesions, right shoulder: Secondary | ICD-10-CM

## 2015-11-22 DIAGNOSIS — M75121 Complete rotator cuff tear or rupture of right shoulder, not specified as traumatic: Secondary | ICD-10-CM

## 2015-11-24 ENCOUNTER — Ambulatory Visit: Admission: RE | Admit: 2015-11-24 | Payer: Medicare PPO | Source: Ambulatory Visit

## 2015-11-24 ENCOUNTER — Ambulatory Visit
Admission: RE | Admit: 2015-11-24 | Discharge: 2015-11-24 | Disposition: A | Discharge status: Home / Self Care | Payer: Medicare PPO | Source: Ambulatory Visit | Attending: Surgery | Admitting: Surgery

## 2015-11-24 DIAGNOSIS — W1830XA Fall on same level, unspecified, initial encounter: Secondary | ICD-10-CM | POA: Diagnosis not present

## 2015-11-24 DIAGNOSIS — M75121 Complete rotator cuff tear or rupture of right shoulder, not specified as traumatic: Secondary | ICD-10-CM | POA: Insufficient documentation

## 2015-11-24 DIAGNOSIS — M19011 Primary osteoarthritis, right shoulder: Secondary | ICD-10-CM | POA: Diagnosis not present

## 2015-11-24 DIAGNOSIS — M7551 Bursitis of right shoulder: Secondary | ICD-10-CM | POA: Diagnosis not present

## 2015-11-24 DIAGNOSIS — M7581 Other shoulder lesions, right shoulder: Secondary | ICD-10-CM | POA: Diagnosis present

## 2015-12-02 ENCOUNTER — Ambulatory Visit: Payer: Self-pay

## 2015-12-08 ENCOUNTER — Other Ambulatory Visit: Payer: Self-pay

## 2016-07-10 ENCOUNTER — Other Ambulatory Visit: Payer: Self-pay | Admitting: Family Medicine

## 2016-07-10 ENCOUNTER — Ambulatory Visit
Admission: RE | Admit: 2016-07-10 | Discharge: 2016-07-10 | Disposition: A | Discharge status: Home / Self Care | Payer: Medicare PPO | Source: Ambulatory Visit | Attending: Family Medicine | Admitting: Family Medicine

## 2016-07-10 DIAGNOSIS — M7989 Other specified soft tissue disorders: Secondary | ICD-10-CM | POA: Diagnosis not present

## 2016-07-10 DIAGNOSIS — R7989 Other specified abnormal findings of blood chemistry: Secondary | ICD-10-CM

## 2016-07-10 DIAGNOSIS — R938 Abnormal findings on diagnostic imaging of other specified body structures: Secondary | ICD-10-CM | POA: Diagnosis not present

## 2016-10-01 HISTORY — PX: REPLACEMENT TOTAL KNEE BILATERAL: SUR1225

## 2017-10-01 DIAGNOSIS — I639 Cerebral infarction, unspecified: Secondary | ICD-10-CM

## 2017-10-01 HISTORY — DX: Cerebral infarction, unspecified: I63.9

## 2017-10-04 ENCOUNTER — Other Ambulatory Visit: Payer: Self-pay | Admitting: Family Medicine

## 2017-10-04 DIAGNOSIS — H532 Diplopia: Secondary | ICD-10-CM

## 2017-10-05 ENCOUNTER — Other Ambulatory Visit: Payer: Self-pay

## 2017-10-05 ENCOUNTER — Encounter: Payer: Self-pay | Admitting: Emergency Medicine

## 2017-10-05 ENCOUNTER — Observation Stay
Admission: EM | Admit: 2017-10-05 | Discharge: 2017-10-06 | Disposition: A | Discharge status: Home / Self Care | Payer: Medicare PPO | Attending: Internal Medicine | Admitting: Internal Medicine

## 2017-10-05 ENCOUNTER — Ambulatory Visit
Admission: RE | Admit: 2017-10-05 | Discharge: 2017-10-05 | Disposition: A | Discharge status: Home / Self Care | Payer: Medicare PPO | Source: Ambulatory Visit | Attending: Family Medicine | Admitting: Family Medicine

## 2017-10-05 DIAGNOSIS — I639 Cerebral infarction, unspecified: Secondary | ICD-10-CM | POA: Diagnosis not present

## 2017-10-05 DIAGNOSIS — Z7902 Long term (current) use of antithrombotics/antiplatelets: Secondary | ICD-10-CM | POA: Diagnosis not present

## 2017-10-05 DIAGNOSIS — I10 Essential (primary) hypertension: Secondary | ICD-10-CM

## 2017-10-05 DIAGNOSIS — J45909 Unspecified asthma, uncomplicated: Secondary | ICD-10-CM | POA: Diagnosis not present

## 2017-10-05 DIAGNOSIS — Z7982 Long term (current) use of aspirin: Secondary | ICD-10-CM | POA: Diagnosis not present

## 2017-10-05 DIAGNOSIS — E785 Hyperlipidemia, unspecified: Secondary | ICD-10-CM | POA: Diagnosis not present

## 2017-10-05 DIAGNOSIS — H532 Diplopia: Secondary | ICD-10-CM

## 2017-10-05 DIAGNOSIS — I739 Peripheral vascular disease, unspecified: Secondary | ICD-10-CM | POA: Insufficient documentation

## 2017-10-05 DIAGNOSIS — Z79899 Other long term (current) drug therapy: Secondary | ICD-10-CM | POA: Insufficient documentation

## 2017-10-05 DIAGNOSIS — R29898 Other symptoms and signs involving the musculoskeletal system: Secondary | ICD-10-CM

## 2017-10-05 DIAGNOSIS — H5319 Other subjective visual disturbances: Secondary | ICD-10-CM

## 2017-10-05 HISTORY — DX: Unspecified asthma, uncomplicated: J45.909

## 2017-10-05 HISTORY — DX: Essential (primary) hypertension: I10

## 2017-10-05 LAB — CBC
HCT: 42.2 % (ref 40.0–52.0)
Hemoglobin: 14.6 g/dL (ref 13.0–18.0)
MCH: 32.3 pg (ref 26.0–34.0)
MCHC: 34.6 g/dL (ref 32.0–36.0)
MCV: 93.3 fL (ref 80.0–100.0)
Platelets: 263 10*3/uL (ref 150–440)
RBC: 4.52 MIL/uL (ref 4.40–5.90)
RDW: 13.7 % (ref 11.5–14.5)
WBC: 8.1 10*3/uL (ref 3.8–10.6)

## 2017-10-05 LAB — COMPREHENSIVE METABOLIC PANEL
ALBUMIN: 4.4 g/dL (ref 3.5–5.0)
ALK PHOS: 89 U/L (ref 38–126)
ALT: 22 U/L (ref 17–63)
AST: 24 U/L (ref 15–41)
Anion gap: 8 (ref 5–15)
BUN: 20 mg/dL (ref 6–20)
CALCIUM: 9.5 mg/dL (ref 8.9–10.3)
CO2: 28 mmol/L (ref 22–32)
CREATININE: 0.76 mg/dL (ref 0.61–1.24)
Chloride: 102 mmol/L (ref 101–111)
GFR calc Af Amer: >60 mL/min (ref >60)
GFR calc non Af Amer: >60 mL/min (ref >60)
GLUCOSE: 101 mg/dL — AB (ref 65–99)
Potassium: 3.6 mmol/L (ref 3.5–5.1)
Sodium: 138 mmol/L (ref 135–145)
Total Bilirubin: 0.9 mg/dL (ref 0.3–1.2)
Total Protein: 7.8 g/dL (ref 6.5–8.1)

## 2017-10-05 LAB — APTT: aPTT: 32 seconds (ref 24–36)

## 2017-10-05 LAB — DIFFERENTIAL
Basophils Absolute: 0.1 10*3/uL (ref 0–0.1)
Basophils Relative: 1 %
Eosinophils Absolute: 0.2 10*3/uL (ref 0–0.7)
Eosinophils Relative: 2 %
LYMPHS PCT: 16 %
Lymphs Abs: 1.3 10*3/uL (ref 1.0–3.6)
MONO ABS: 0.9 10*3/uL (ref 0.2–1.0)
Monocytes Relative: 11 %
NEUTROS ABS: 5.7 10*3/uL (ref 1.4–6.5)
NEUTROS PCT: 70 %

## 2017-10-05 LAB — TROPONIN I: Troponin I: <0.03 ng/mL (ref <0.03)

## 2017-10-05 LAB — PROTIME-INR
INR: 0.99
Prothrombin Time: 13 seconds (ref 11.4–15.2)

## 2017-10-05 MED ORDER — PANTOPRAZOLE SODIUM 40 MG IV SOLR
40.0000 mg | Freq: Two times a day (BID) | INTRAVENOUS | Status: DC
  Administered 2017-10-05 – 2017-10-06 (×2): 40 mg via INTRAVENOUS
  Filled 2017-10-05: qty 40

## 2017-10-05 MED ORDER — MONTELUKAST SODIUM 10 MG PO TABS
10.0000 mg | ORAL_TABLET | Freq: Every day | ORAL | Status: DC
  Filled 2017-10-05: qty 1

## 2017-10-05 MED ORDER — OXYBUTYNIN CHLORIDE 5 MG PO TABS
5.0000 mg | ORAL_TABLET | Freq: Two times a day (BID) | ORAL | Status: DC
  Administered 2017-10-05 – 2017-10-06 (×2): 5 mg via ORAL
  Filled 2017-10-05 (×2): qty 1

## 2017-10-05 MED ORDER — ACETAMINOPHEN 325 MG PO TABS: 650.0000 mg | ORAL_TABLET | Freq: Four times a day (QID) | ORAL | Status: DC | PRN

## 2017-10-05 MED ORDER — ENOXAPARIN SODIUM 40 MG/0.4ML ~~LOC~~ SOLN
40.0000 mg | SUBCUTANEOUS | Status: DC
  Administered 2017-10-05: 21:00:00 40 mg via SUBCUTANEOUS
  Filled 2017-10-05: qty 0.4

## 2017-10-05 MED ORDER — LOSARTAN POTASSIUM 50 MG PO TABS: 100.0000 mg | ORAL_TABLET | Freq: Every day | ORAL | Status: DC

## 2017-10-05 MED ORDER — HYDROCHLOROTHIAZIDE 25 MG PO TABS: 25.0000 mg | ORAL_TABLET | Freq: Every day | ORAL | Status: DC

## 2017-10-05 MED ORDER — HYDRALAZINE HCL 20 MG/ML IJ SOLN: 10.0000 mg | INTRAMUSCULAR | Status: DC | PRN

## 2017-10-05 MED ORDER — SODIUM CHLORIDE 0.9 % IV SOLN
INTRAVENOUS | Status: DC
  Administered 2017-10-05: 21:00:00 via INTRAVENOUS

## 2017-10-05 MED ORDER — MOMETASONE FURO-FORMOTEROL FUM 200-5 MCG/ACT IN AERO
2.0000 | INHALATION_SPRAY | Freq: Two times a day (BID) | RESPIRATORY_TRACT | Status: DC
  Administered 2017-10-06: 2 via RESPIRATORY_TRACT
  Filled 2017-10-05: qty 8.8

## 2017-10-05 MED ORDER — ATORVASTATIN CALCIUM 20 MG PO TABS: 10.0000 mg | ORAL_TABLET | Freq: Every day | ORAL | Status: DC

## 2017-10-05 MED ORDER — CLOPIDOGREL BISULFATE 75 MG PO TABS
75.0000 mg | ORAL_TABLET | Freq: Every day | ORAL | Status: DC
  Administered 2017-10-05 – 2017-10-06 (×2): 75 mg via ORAL
  Filled 2017-10-05 (×2): qty 1

## 2017-10-05 MED ORDER — ACETAMINOPHEN 650 MG RE SUPP: 650.0000 mg | Freq: Four times a day (QID) | RECTAL | Status: DC | PRN

## 2017-10-05 MED ORDER — ONDANSETRON HCL 4 MG/2ML IJ SOLN: 4.0000 mg | Freq: Four times a day (QID) | INTRAMUSCULAR | Status: DC | PRN

## 2017-10-05 MED ORDER — LABETALOL HCL 5 MG/ML IV SOLN: 10.0000 mg | Freq: Once | INTRAVENOUS | Status: DC

## 2017-10-05 MED ORDER — AMLODIPINE BESYLATE 5 MG PO TABS: 5.0000 mg | ORAL_TABLET | Freq: Every day | ORAL | Status: DC

## 2017-10-05 MED ORDER — TRAMADOL HCL 50 MG PO TABS: 50.0000 mg | ORAL_TABLET | Freq: Four times a day (QID) | ORAL | Status: DC | PRN

## 2017-10-05 MED ORDER — LABETALOL HCL 5 MG/ML IV SOLN: 10.0000 mg | Freq: Once | INTRAVENOUS | Status: DC | PRN

## 2017-10-05 MED ORDER — FLUTICASONE PROPIONATE 50 MCG/ACT NA SUSP
2.0000 | Freq: Every day | NASAL | Status: DC
  Administered 2017-10-06: 11:00:00 2 via NASAL

## 2017-10-05 MED ORDER — BISACODYL 10 MG RE SUPP: 10.0000 mg | Freq: Every day | RECTAL | Status: DC | PRN

## 2017-10-05 MED ORDER — ONDANSETRON HCL 4 MG PO TABS: 4.0000 mg | ORAL_TABLET | Freq: Four times a day (QID) | ORAL | Status: DC | PRN

## 2017-10-05 MED ORDER — DOCUSATE SODIUM 100 MG PO CAPS
100.0000 mg | ORAL_CAPSULE | Freq: Two times a day (BID) | ORAL | Status: DC
  Administered 2017-10-05 – 2017-10-06 (×2): 100 mg via ORAL
  Filled 2017-10-05 (×2): qty 1

## 2017-10-05 NOTE — ED Notes (Signed)
Patient transported to 124 

## 2017-10-05 NOTE — H&P (Signed)
History and Physical    ABANOUB HANKEN TIW:[id number] DOB: [birth year] DOA: 10/05/2017  Referring physician: Dr. Corky Downs PCP: [name], MD  Specialists: none  Chief Complaint: RUE weakness and blurred vision  HPI: David Jennings is a 77 y.o. male has a past medical history significant for HTN and asthma now with 3 day hx of blurred vision and RUE weakness. In ER, BP elevated and MRI shows acute left pons CVA. He is now admitted. No fever. Denies HA or issues with speech/swallowing. Denies CP or SOB. No change in bowels or bladder  Review of Systems: The patient denies anorexia, fever, weight loss,, vision loss, decreased hearing, hoarseness, chest pain, syncope, dyspnea on exertion, peripheral edema, balance deficits, hemoptysis, abdominal pain, melena, hematochezia, severe indigestion/heartburn, hematuria, incontinence, genital sores, suspicious skin lesions, transient blindness, difficulty walking, depression, unusual weight change, abnormal bleeding, enlarged lymph nodes, angioedema, and breast masses.   Past Medical History:  Diagnosis Date  . Asthma   . Hypertension    Past Surgical History:  Procedure Laterality Date  . ANKLE SURGERY Right   . BACK SURGERY    . REPLACEMENT TOTAL KNEE BILATERAL    . TONSILLECTOMY     Social History:  reports that  has never smoked. he has never used smokeless tobacco. He reports that he drinks alcohol. He reports that he does not use drugs.  No Known Allergies  History reviewed. No pertinent family history.  Prior to Admission medications   Not on File   Physical Exam: Vitals:   10/05/17 1650 10/05/17 1651  BP:  (!) 191/106  Pulse:  77  Resp:  18  Temp:  98.5 F (36.9 C)  TempSrc:  Oral  SpO2:  93%  Weight: 119.7 kg (264 lb)   Height: 5\' 9"  (1.753 m)      General:  No apparent distress, WDWN, [place]/AT  Eyes: PERRL, EOMI, no scleral icterus, conjunctiva clear  ENT: moist oropharynx without exudate, TM's benign,  dentition fair  Neck: supple, no lymphadenopathy. No bruits or thyromegaly  Cardiovascular: regular rate without MRG; 2+ peripheral pulses, no JVD, no peripheral edema  Respiratory: CTA biL, good air movement without wheezing, rhonchi or crackled. Respiratory effort normal  Abdomen: soft, non tender to palpation, positive bowel sounds, no guarding, no rebound  Skin: no rashes or lesions  Musculoskeletal: normal bulk and tone, no joint swelling  Psychiatric: normal mood and affect, A&OX3  Neurologic: CN 2-12 grossly intact, Motor strength 4+/5 in RUE, other groups 5/5. DTR's symmetric and sensory exam non-focal  Labs on Admission:  Basic Metabolic Panel: Recent Labs  Lab 10/05/17 1656  NA 138  K 3.6  CL 102  CO2 28  GLUCOSE 101*  BUN 20  CREATININE 0.76  CALCIUM 9.5   Liver Function Tests: Recent Labs  Lab 10/05/17 1656  AST 24  ALT 22  ALKPHOS 89  BILITOT 0.9  PROT 7.8  ALBUMIN 4.4   No results for input(s): LIPASE, AMYLASE in the last 168 hours. No results for input(s): AMMONIA in the last 168 hours. CBC: Recent Labs  Lab 10/05/17 1656  WBC 8.1  NEUTROABS 5.7  HGB 14.6  HCT 42.2  MCV 93.3  PLT 263   Cardiac Enzymes: Recent Labs  Lab 10/05/17 1656  TROPONINI <0.03    BNP (last 3 results) No results for input(s): BNP in the last 8760 hours.  ProBNP (last 3 results) No results for input(s): PROBNP in the last 8760 hours.  CBG: No results  for input(s): GLUCAP in the last 168 hours.  Radiological Exams on Admission: Mr Brain Wo Contrast  Result Date: 10/05/2017 CLINICAL DATA:  Suspected stroke. Double vision with blurry vision. RIGHT hand numbness. Symptoms began 10/03/2017. EXAM: MRI HEAD WITHOUT CONTRAST TECHNIQUE: Multiplanar, multiecho pulse sequences of the brain and surrounding structures were obtained without intravenous contrast. COMPARISON:  None. FINDINGS: Brain: There is a subcentimeter focus of restricted diffusion, LEFT paramedian  lower pons, corresponding low ADC. This abnormality is consistent with nonhemorrhagic infarction. No similar lesions elsewhere. Its appearance on diffusion-weighted imaging, as well as slight T2 and FLAIR hyperintensity, are consistent with an acute time course. No hemorrhage, mass lesion, hydrocephalus, or extra-axial fluid. Generalized atrophy. Moderately advanced chronic microvascular ischemic change. Vascular: Marked dolichoectasia. No vascular occlusion. Flow voids are maintained. No chronic hemorrhage. Skull and upper cervical spine: Unremarkable. Sinuses/Orbits: No layering fluid or orbital mass. Other: None. IMPRESSION: Subcentimeter focus of acute infarction affects the LEFT paramedian lower pons. This is not associated with hemorrhage or vascular occlusion. Generalized atrophy with moderately advanced small vessel disease. These results will be called to the ordering clinician or representative by the Radiologist Assistant, and communication documented in the PACS or zVision Dashboard. Electronically Signed   By: Staci Righter M.D.   On: 10/05/2017 15:38    EKG: Independently reviewed.  Assessment/Plan Principal Problem:   CVA (cerebrovascular accident) [facility]) Active Problems:   Accelerated hypertension   Visual distortion   Upper extremity weakness   Will observe on floor with telemetry and begin Plavix. Echo and carotid US ordered. Consult PT, OT, and CM. Optimize BP regimen. Repeat labs in AM  Diet: low salt Fluids: NS@75  DVT Prophylaxis: Lovenox  Code Status: FULL  Family Communication: yes  Disposition Plan: home  Time spent: 50 min

## 2017-10-05 NOTE — Progress Notes (Signed)
Ok per MD Willey Blade to hold B/P medications for now and to modify Labetalol 10 mg PRN for B/P > than 462 systolic. Continue to monitor

## 2017-10-05 NOTE — ED Provider Notes (Signed)
[facility] Emergency Department Provider Note   ____________________________________________    I have reviewed the triage vital signs and the nursing notes.   HISTORY  Chief Complaint Cerebrovascular Accident     HPI FRANZ SVEC is a 77 y.o. male who was sent to the ED for evaluation after concerning MRI.  Patient reports 3 days ago he developed diplopia and weakness in his right hand, he noticed this when he had difficulty lifting a bag of groceries.  Denies headache.  Not on blood thinners.  No trauma.  No history of the same.  Saw his PCP on Friday who ordered an MRI for Saturday.  Called today and referred to the emergency department for concerning MRI.  MRI reviewed demonstrates acute CVA, nonhemorrhagic.  The patient is a history of high blood pressure, no history of diabetes.   Past Medical History:  Diagnosis Date  . Asthma   . Hypertension     There are no active problems to display for this patient.   Past Surgical History:  Procedure Laterality Date  . ANKLE SURGERY Right   . BACK SURGERY    . REPLACEMENT TOTAL KNEE BILATERAL    . TONSILLECTOMY      Prior to Admission medications   Not on File     Allergies Patient has no known allergies.  History reviewed. No pertinent family history.  Social History Social History   Tobacco Use  . Smoking status: Never Smoker  . Smokeless tobacco: Never Used  Substance Use Topics  . Alcohol use: Yes    Comment: rarely  . Drug use: No    Review of Systems  Constitutional: Occasional vertigo Eyes: No visual changes.  ENT: No sore throat. Cardiovascular: Denies chest pain. Respiratory: Denies shortness of breath. Gastrointestinal: No abdominal pain.  No nausea, no vomiting.   Genitourinary: Negative for dysuria. Musculoskeletal: Chronic right ankle pain Skin: Negative for rash. Neurological: As above   ____________________________________________   PHYSICAL  EXAM:  VITAL SIGNS: ED Triage Vitals  Enc Vitals Group     BP 10/05/17 1651 (!) 191/106     Pulse Rate 10/05/17 1651 77     Resp 10/05/17 1651 18     Temp 10/05/17 1651 98.5 F (36.9 C)     Temp Source 10/05/17 1651 Oral     SpO2 10/05/17 1651 93 %     Weight 10/05/17 1650 119.7 kg (264 lb)     Height 10/05/17 1650 1.753 m (5\' 9" )     Head Circumference --      Peak Flow --      Pain Score --      Pain Loc --      Pain Edu? --      Excl. in [place]? --     Constitutional: Alert and oriented. No acute distress. Pleasant and interactive Eyes: Conjunctivae are normal.  PERRLA, EOMI, no visual field defects Head: Atraumatic. Nose: No congestion/rhinnorhea.  Neck:  Painless ROM Cardiovascular: Normal rate, regular rhythm. Grossly normal heart sounds.  Good peripheral circulation.  No arrhythmia  respiratory: Normal respiratory effort.  No retractions.  Gastrointestinal: Soft and nontender. No distention.   Genitourinary: deferred Musculoskeletal: No lower extremity tenderness nor edema.  Warm and well perfused Neurologic:  Normal speech and language. No gross focal neurologic deficits are appreciated.  Cranial nerves II through XII normal.  Some right grip strength weakness, normal strength in the lower extremities Skin:  Skin is warm, dry and intact.  No rash noted. Psychiatric: Mood and affect are normal. Speech and behavior are normal.  ____________________________________________   LABS (all labs ordered are listed, but only abnormal results are displayed)  Labs Reviewed  COMPREHENSIVE METABOLIC PANEL - Abnormal; Notable for the following components:      Result Value   Glucose, Bld 101 (*)    All other components within normal limits  PROTIME-INR  APTT  CBC  DIFFERENTIAL  TROPONIN I   ____________________________________________  EKG  ED ECG REPORT I, Lavonia Drafts, the attending physician, personally viewed and interpreted this ECG.  Date: 10/05/2017  Rate:  69 Rhythm: normal sinus rhythm QRS Axis: normal Intervals: First-degree AV block ST/T Wave abnormalities: Non-specific changes   ____________________________________________  RADIOLOGY  Reviewed MRI, acute infarction left paramedian lower pons ____________________________________________   PROCEDURES  Procedure(s) performed: No  Procedures   Critical Care performed:No ____________________________________________   INITIAL IMPRESSION / ASSESSMENT AND PLAN / ED COURSE  Pertinent labs & imaging results that were available during my care of the patient were reviewed by me and considered in my medical decision making (see chart for details).  Patient presents with right hand weakness, diplopia, MRI demonstrates acute infarction.  Patient is markedly hypertensive we will treat this with IV labetalol.  Will admit to the hospitalist service for carotid Dopplers, blood pressure control, neuro eval    ____________________________________________   FINAL CLINICAL IMPRESSION(S) / ED DIAGNOSES  Final diagnoses:  Cerebrovascular accident (CVA), unspecified mechanism ([place])        Note:  This document was prepared using Dragon voice recognition software and may include unintentional dictation errors.    Lavonia Drafts, MD 10/05/17 [phone]

## 2017-10-05 NOTE — ED Notes (Signed)
Attempted to call first report. 

## 2017-10-05 NOTE — ED Triage Notes (Signed)
FIRST NURSE NOTE-had near vision changes and difficulty using right hand since Wednesday. Had MRI and was called and told to come to ED because had stroke. Ambulatory without distress.

## 2017-10-05 NOTE — ED Triage Notes (Signed)
Pt to ED with wife c/o left side stroke.  States noticed symptoms of weakness, unbalanced, right arm weakness, and blurry vision on Wednesday.  Was seen by PCP on Friday and had MRI today, was called back and told had a stroke and to come to ED.  Was placed on 81mg  ASA yesterday.  Denies speech impairments, facial droop.

## 2017-10-06 ENCOUNTER — Observation Stay: Payer: Medicare PPO

## 2017-10-06 ENCOUNTER — Observation Stay
Admit: 2017-10-06 | Discharge: 2017-10-06 | Disposition: A | Discharge status: Home / Self Care | Payer: Medicare PPO | Attending: Internal Medicine | Admitting: Internal Medicine

## 2017-10-06 LAB — ECHOCARDIOGRAM COMPLETE
AO mean calculated velocity dopler: 119 cm/s
AV pk vel: 172 cm/s
AVCELMEANRAT: 0.67
AVG: 7 mmHg
AVPG: 12 mmHg
Ao pk vel: 0.59 m/s
E decel time: 296 msec
EERAT: 8.64
FS: 41 % (ref 28–44)
Height: 69 in
IVS/LV PW RATIO, ED: 1.16
LA ID, A-P, ES: 58 mm
LA vol A4C: 103 ml
LADIAMINDEX: 2.36 cm/m2
LEFT ATRIUM END SYS DIAM: 58 mm
LV E/e' medial: 8.64
LV E/e'average: 8.64
LV TDI E'LATERAL: 6.96
LV e' LATERAL: 6.96 cm/s
LVOT VTI: 22.8 cm
LVOT peak VTI: 0.66 cm
LVOT peak vel: 102 cm/s
MV Dec: 296
MV pk A vel: 85.9 m/s
MVAP: 2.53 cm2
MVPKEVEL: 60.1 m/s
P 1/2 time: 87 ms
PW: 11.1 mm — AB (ref 0.6–1.1)
TDI e' medial: 5.55
VTI: 34.4 cm
Weight: 4179.2 oz

## 2017-10-06 LAB — CBC
HCT: 40.6 % (ref 40.0–52.0)
HEMOGLOBIN: 13.9 g/dL (ref 13.0–18.0)
MCH: 31.8 pg (ref 26.0–34.0)
MCHC: 34.2 g/dL (ref 32.0–36.0)
MCV: 93 fL (ref 80.0–100.0)
Platelets: 240 10*3/uL (ref 150–440)
RBC: 4.36 MIL/uL — AB (ref 4.40–5.90)
RDW: 13.2 % (ref 11.5–14.5)
WBC: 6.1 10*3/uL (ref 3.8–10.6)

## 2017-10-06 LAB — COMPREHENSIVE METABOLIC PANEL
ALK PHOS: 75 U/L (ref 38–126)
ALT: 19 U/L (ref 17–63)
ANION GAP: 8 (ref 5–15)
AST: 20 U/L (ref 15–41)
Albumin: 3.8 g/dL (ref 3.5–5.0)
BILIRUBIN TOTAL: 0.8 mg/dL (ref 0.3–1.2)
BUN: 19 mg/dL (ref 6–20)
CALCIUM: 8.7 mg/dL — AB (ref 8.9–10.3)
CO2: 29 mmol/L (ref 22–32)
Chloride: 104 mmol/L (ref 101–111)
Creatinine, Ser: 0.69 mg/dL (ref 0.61–1.24)
GFR calc non Af Amer: >60 mL/min (ref >60)
Glucose, Bld: 91 mg/dL (ref 65–99)
Potassium: 3.3 mmol/L — ABNORMAL LOW (ref 3.5–5.1)
Sodium: 141 mmol/L (ref 135–145)
Total Protein: 6.5 g/dL (ref 6.5–8.1)

## 2017-10-06 LAB — LIPID PANEL
CHOL/HDL RATIO: 6.4 ratio
CHOLESTEROL: 166 mg/dL (ref 0–200)
HDL: 26 mg/dL — ABNORMAL LOW (ref >40)
LDL Cholesterol: 103 mg/dL — ABNORMAL HIGH (ref 0–99)
Triglycerides: 184 mg/dL — ABNORMAL HIGH (ref <150)
VLDL: 37 mg/dL (ref 0–40)

## 2017-10-06 MED ORDER — ATORVASTATIN CALCIUM 40 MG PO TABS: 40.0000 mg | ORAL_TABLET | Freq: Every day | ORAL | 0 refills | Status: DC

## 2017-10-06 MED ORDER — ATORVASTATIN CALCIUM 20 MG PO TABS: 40.0000 mg | ORAL_TABLET | Freq: Every day | ORAL | Status: DC

## 2017-10-06 MED ORDER — CLOPIDOGREL BISULFATE 75 MG PO TABS: 75.0000 mg | ORAL_TABLET | Freq: Every day | ORAL | 0 refills | Status: DC

## 2017-10-06 NOTE — Discharge Summary (Signed)
[place] at [place] NAME: David [facility]    MR#:  [id number]  DATE OF BIRTH:  [birth year]  DATE OF ADMISSION:  10/05/2017 ADMITTING PHYSICIAN: Idelle Crouch, MD  DATE OF DISCHARGE: 10/06/2016  PRIMARY CARE PHYSICIAN: [name], MD    ADMISSION DIAGNOSIS:  CVA (cerebrovascular accident) [facility]) [I63.9] Cerebrovascular accident (CVA), unspecified mechanism ([place]) [I63.9]  DISCHARGE DIAGNOSIS:  Principal Problem:   CVA (cerebrovascular accident) [facility]) Active Problems:   Accelerated hypertension   Visual distortion   Upper extremity weakness   SECONDARY DIAGNOSIS:   Past Medical History:  Diagnosis Date  . Asthma   . Hypertension     HOSPITAL COURSE:   1.  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  Patient with right arm weakness.  He also had some blurred vision with both eyes open.  The vision issue has cleared up.  Right arm is a little bit stronger today but not back to normal.  Patient was taken to step up and treatment for stroke with Plavix.  Also prescribed Lipitor.  Physical therapy recommended walking with a cane.  Outpatient physical therapy and occupational therapy prescription given.  Stop aspirin and meloxicam. 2.  Essential hypertension.  Allow permissive hypertension.  Stop Norvasc.  Okay to go back on losartan HCT tomorrow 3.  Hyperlipidemia unspecified start atorvastatin.  LDL 103.  Goal less than 70 4.  Morbid obesity weight loss needed  5.  History of asthma respiratory status stable 6.  History of spacer and right foot.  Patient states no plans for replacement at this point   DISCHARGE CONDITIONS:   Satisfactory  CONSULTS OBTAINED:  None  DRUG ALLERGIES:  No Known Allergies  DISCHARGE MEDICATIONS:   Allergies as of 10/06/2017   No Known Allergies     Medication List    STOP taking these medications   amLODipine 5 MG tablet Commonly known as:  NORVASC   aspirin 81 MG tablet   meloxicam  7.5 MG tablet Commonly known as:  MOBIC     TAKE these medications   atorvastatin 40 MG tablet Commonly known as:  LIPITOR Take 1 tablet (40 mg total) by mouth daily at 6 PM.   clopidogrel 75 MG tablet Commonly known as:  PLAVIX Take 1 tablet (75 mg total) by mouth daily. Start taking on:  10/07/2017   DOCUSATE SODIUM PO Take 1 tablet by mouth every other day.   fluticasone 50 MCG/ACT nasal spray Commonly known as:  FLONASE Place 2 sprays into the nose daily as needed.   losartan-hydrochlorothiazide 100-25 MG tablet Commonly known as:  HYZAAR Take 1 tablet by mouth daily.   montelukast 10 MG tablet Commonly known as:  SINGULAIR Take 10 mg by mouth daily.   MULTI-VITAMINS Tabs Take 1 tablet by mouth daily.   oxybutynin 5 MG tablet Commonly known as:  DITROPAN Take 5 mg by mouth 2 (two) times daily.   PROVENTIL HFA 108 (90 Base) MCG/ACT inhaler Generic drug:  albuterol Inhale 2 puffs into the lungs every 6 (six) hours as needed for wheezing.   ranitidine 150 MG tablet Commonly known as:  ZANTAC Take 150 mg by mouth daily as needed for heartburn.   REFRESH TEARS 0.5 % Soln Generic drug:  carboxymethylcellulose Apply 1-2 drops to eye daily as needed for dry eyes.   SYMBICORT 160-4.5 MCG/ACT inhaler Generic drug:  budesonide-formoterol Inhale 2 puffs into the lungs 2 (two) times daily.   traMADol 50  MG tablet Commonly known as:  ULTRAM Take 50 mg by mouth every 8 (eight) hours as needed for pain.        DISCHARGE INSTRUCTIONS:    Follow-up PMD 1 week Outpatient physical therapy and occupational therapy referral  If you experience worsening of your admission symptoms, develop shortness of breath, life threatening emergency, suicidal or homicidal thoughts you must seek medical attention immediately by calling 911 or calling your MD immediately  if symptoms less severe.  You Must read complete instructions/literature along with all the possible adverse  reactions/side effects for all the Medicines you take and that have been prescribed to you. Take any new Medicines after you have completely understood and accept all the possible adverse reactions/side effects.   Please note  You were cared for by a hospitalist during your hospital stay. If you have any questions about your discharge medications or the care you received while you were in the hospital after you are discharged, you can call the unit and asked to speak with the hospitalist on call if the hospitalist that took care of you is not available. Once you are discharged, your primary care physician will handle any further medical issues. Please note that NO REFILLS for any discharge medications will be authorized once you are discharged, as it is imperative that you return to your primary care physician (or establish a relationship with a primary care physician if you do not have one) for your aftercare needs so that they can reassess your need for medications and monitor your lab values.    Today   CHIEF COMPLAINT:   Chief Complaint  Patient presents with  . Cerebrovascular Accident    HISTORY OF PRESENT ILLNESS:  David [facility]  is a 77 y.o. male  came in with right arm weakness and visual disturbance   VITAL SIGNS:  Blood pressure (!) 155/85, pulse 64, temperature 98.3 F (36.8 C), temperature source Oral, resp. rate 20, height 5\' 9"  (1.753 m), weight 118.5 kg (261 lb 3.2 oz), SpO2 95 %.    PHYSICAL EXAMINATION:  GENERAL:  77 y.o.-year-old patient lying in the bed with no acute distress.  EYES: Pupils equal, round, reactive to light and accommodation. No scleral icterus. Extraocular muscles intact.  HEENT: Head atraumatic, normocephalic. Oropharynx and nasopharynx clear.  NECK:  Supple, no jugular venous distention. No thyroid enlargement, no tenderness.  LUNGS: Normal breath sounds bilaterally, no wheezing, rales,rhonchi or crepitation. No use of accessory muscles of  respiration.  CARDIOVASCULAR: S1, S2 normal. No murmurs, rubs, or gallops.  ABDOMEN: Soft, non-tender, non-distended. Bowel sounds present. No organomegaly or mass.  EXTREMITIES: No pedal edema, cyanosis, or clubbing.  NEUROLOGIC: Cranial nerves II through XII are intact.  power right upper extremity 4+ out of 5 (limited with rotator cuff tear right shoulder).  Power 5 out of 5 elsewhere.. Sensation intact. Gait not checked.  PSYCHIATRIC: The patient is alert and oriented x 3.  SKIN: No obvious rash, lesion, or ulcer.   DATA REVIEW:   CBC Recent Labs  Lab 10/06/17 0402  WBC 6.1  HGB 13.9  HCT 40.6  PLT 240    Chemistries  Recent Labs  Lab 10/06/17 0402  NA 141  K 3.3*  CL 104  CO2 29  GLUCOSE 91  BUN 19  CREATININE 0.69  CALCIUM 8.7*  AST 20  ALT 19  ALKPHOS 75  BILITOT 0.8    Cardiac Enzymes Recent Labs  Lab 10/05/17 1656  TROPONINI <0.03  RADIOLOGY:  Mr Brain Wo Contrast  Result Date: 10/05/2017 CLINICAL DATA:  Suspected stroke. Double vision with blurry vision. RIGHT hand numbness. Symptoms began 10/03/2017. EXAM: MRI HEAD WITHOUT CONTRAST TECHNIQUE: Multiplanar, multiecho pulse sequences of the brain and surrounding structures were obtained without intravenous contrast. COMPARISON:  None. FINDINGS: Brain: There is a subcentimeter focus of restricted diffusion, LEFT paramedian lower pons, corresponding low ADC. This abnormality is consistent with nonhemorrhagic infarction. No similar lesions elsewhere. Its appearance on diffusion-weighted imaging, as well as slight T2 and FLAIR hyperintensity, are consistent with an acute time course. No hemorrhage, mass lesion, hydrocephalus, or extra-axial fluid. Generalized atrophy. Moderately advanced chronic microvascular ischemic change. Vascular: Marked dolichoectasia. No vascular occlusion. Flow voids are maintained. No chronic hemorrhage. Skull and upper cervical spine: Unremarkable. Sinuses/Orbits: No layering fluid or  orbital mass. Other: None. IMPRESSION: Subcentimeter focus of acute infarction affects the LEFT paramedian lower pons. This is not associated with hemorrhage or vascular occlusion. Generalized atrophy with moderately advanced small vessel disease. These results will be called to the ordering clinician or representative by the Radiologist Assistant, and communication documented in the PACS or zVision Dashboard. Electronically Signed   By: Staci Righter M.D.   On: 10/05/2017 15:38   US Carotid Bilateral  Result Date: 10/06/2017 CLINICAL DATA:  77 year old male with acute left paramedian pontine infarct. EXAM: BILATERAL CAROTID DUPLEX ULTRASOUND TECHNIQUE: Pearline Cables scale imaging, color Doppler and duplex ultrasound were performed of bilateral carotid and vertebral arteries in the neck. COMPARISON:  Brain MRI obtained yesterday FINDINGS: Criteria: Quantification of carotid stenosis is based on velocity parameters that correlate the residual internal carotid diameter with NASCET-based stenosis levels, using the diameter of the distal internal carotid lumen as the denominator for stenosis measurement. The following velocity measurements were obtained: RIGHT ICA:  79/19 cm/sec CCA:  332/95 cm/sec SYSTOLIC ICA/CCA RATIO:  0.7 DIASTOLIC ICA/CCA RATIO:  1.3 ECA:  132 cm/sec LEFT ICA:  82/19 cm/sec CCA:  18/84 cm/sec SYSTOLIC ICA/CCA RATIO:  0.9 DIASTOLIC ICA/CCA RATIO:  1.4 ECA:  103 cm/sec RIGHT CAROTID ARTERY: Trace heterogeneous atherosclerotic plaque in the proximal internal carotid artery. By peak systolic velocity criteria, the estimated stenosis remains less than 50%. RIGHT VERTEBRAL ARTERY:  Patent with normal antegrade flow. LEFT CAROTID ARTERY: No significant atherosclerotic plaque or evidence of stenosis. LEFT VERTEBRAL ARTERY:  Patent with normal antegrade flow. IMPRESSION: 1. Mild (1-49%) stenosis proximal right internal carotid artery secondary to trace heterogeneous atherosclerotic plaque. 2. No significant  atherosclerotic plaque or evidence of stenosis in the left internal carotid artery. 3. Vertebral arteries are patent with normal antegrade flow. Signed, Criselda Peaches, MD [facility] Radiology Electronically Signed   By: Jacqulynn Cadet M.D.   On: 10/06/2017 11:59    Management plans discussed with the patient, family and they are in agreement.  CODE STATUS:     Code Status Orders  (From admission, onward)        Start     Ordered   10/05/17 2003  Full code  Continuous     10/05/17 2002    Code Status History    Date Active Date Inactive Code Status Order ID Comments User Context   This patient has a current code status but no historical code status.    Advance Directive Documentation     Most Recent Value  Type of Advance Directive  Healthcare Power of Attorney, Living will  Pre-existing out of facility DNR order (yellow form or pink MOST form)  No data  "MOST" Form in Place?  No data      TOTAL TIME TAKING CARE OF THIS PATIENT: 35 minutes.    Loletha Grayer M.D on 10/06/2017 at 2:41 PM  Between 7am to 6pm - Pager - [phone]  After 6pm go to [email or web address] - password [email or web address]  [phone]  CC: Primary care physician; [name], MD

## 2017-10-06 NOTE — Progress Notes (Signed)
Education given upon D/C. IV removed intact. VSS. Wife at bedside. Taken out by wheelchair.

## 2017-10-06 NOTE — Evaluation (Signed)
Physical Therapy Evaluation Patient Details Name: David [facility] MRN: [id number] DOB: 1940/12/02 Today's Date: 10/06/2017   History of Present Illness  77 yo male with onset of R UE weakness with pons infarct on MRI. Noted diplopia and resolved now.  PMHx:  R ankle fracture with permanent spacer, B TKA's, HTN, asthma, rotator cuff tear R shoulder  Clinical Impression  Pt is taken through an exhaustive array of assistive devices to see what the final choice should be to avoid a fall.  Has tolerated SPC well and is less restrictive than the walker.  However, pt is high fall risk with no device and cautioned him not to go without it.  He is not against further therapy so plan for PT and OT to see in outpatient setting.  Focus on acute needs for now with strength and balance as tolerated.      Follow Up Recommendations Outpatient PT    Equipment Recommendations  None recommended by PT(has already a SPC and RW)    Recommendations for Other Services OT consult     Precautions / Restrictions Precautions Precautions: Fall(telemetry) Restrictions Weight Bearing Restrictions: No      Mobility  Bed Mobility Overal bed mobility: Modified Independent             General bed mobility comments: slow but able to get to side of bed with HOB elevated  Transfers Overall transfer level: Modified independent Equipment used: Rolling walker (2 wheeled);Straight cane;1 person hand held assist             General transfer comment: pt can stand with any device  Ambulation/Gait Ambulation/Gait assistance: Supervision;Min assist;Min guard(depending on the device) Ambulation Distance (Feet): 750 Feet(150 x 3, 300) Assistive device: Rolling walker (2 wheeled);1 person hand held assist;Straight cane Gait Pattern/deviations: Step-to pattern;Step-through pattern;Wide base of support;Narrow base of support;Trunk flexed;Shuffle;Decreased stride length Gait velocity: reduced Gait velocity  interpretation: Below normal speed for age/gender General Gait Details: with no device has substantial list to R over unstable ankle.  With [facility] and walker can control lateral balance shifts including sudden changes of direction  Stairs            Wheelchair Mobility    Modified Rankin (Stroke Patients Only)       Balance Overall balance assessment: Needs assistance Sitting-balance support: Feet supported Sitting balance-Leahy Scale: Good     Standing balance support: Bilateral upper extremity supported;Single extremity supported;During functional activity Standing balance-Leahy Scale: Fair                               Pertinent Vitals/Pain Pain Assessment: No/denies pain    Home Living Family/patient expects to be discharged to:: Private residence Living Arrangements: Spouse/significant other Available Help at Discharge: Family;Available 24 hours/day Type of Home: Apartment Home Access: Level entry     Home Layout: One level Home Equipment: Walker - 2 wheels;Cane - single point;Shower seat - built in      Prior Function Level of Independence: Independent         Comments: pt was not using a walker prior to change in R side strength     Hand Dominance   Dominant Hand: Right    Extremity/Trunk Assessment   Upper Extremity Assessment Upper Extremity Assessment: RUE deficits/detail RUE Deficits / Details: weakness generally from shoulder tear and sensory/strength changes in arm RUE Coordination: decreased fine motor;decreased gross motor    Lower Extremity Assessment Lower Extremity  Assessment: RLE deficits/detail RLE Deficits / Details: strength changes RLE RLE Coordination: decreased gross motor    Cervical / Trunk Assessment Cervical / Trunk Assessment: Normal  Communication   Communication: No difficulties  Cognition Arousal/Alertness: Awake/alert Behavior During Therapy: WFL for tasks assessed/performed Overall Cognitive  Status: Within Functional Limits for tasks assessed                                        General Comments      Exercises  Strength on R arm was shoulder flexion 4-, ext 4-, abd 4+, add 3-, biceps 5, triceps 4-, pinch 4+, wrist flex/ext 4+ and grip 5.  RLE was hip flex, abd and add 4+, knee ext 5, flexion 4-, DF 4- and eversion 3+ with ext hallucis 3+.   Assessment/Plan    PT Assessment Patient needs continued PT services  PT Problem List Decreased strength;Decreased range of motion;Decreased activity tolerance;Decreased balance;Decreased mobility;Decreased coordination;Decreased knowledge of use of DME;Decreased safety awareness;Cardiopulmonary status limiting activity;Obesity;Decreased skin integrity       PT Treatment Interventions DME instruction;Gait training;Functional mobility training;Therapeutic activities;Therapeutic exercise;Balance training;Neuromuscular re-education;Patient/family education    PT Goals (Current goals can be found in the Care Plan section)  Acute Rehab PT Goals Patient Stated Goal: to walk and use R arm with better strength PT Goal Formulation: With patient/family Time For Goal Achievement: 10/20/17 Potential to Achieve Goals: Good    Frequency Min 2X/week   Barriers to discharge   has a controlled level home with good resources of help    Co-evaluation               AM-PAC PT "6 Clicks" Daily Activity  Outcome Measure Difficulty turning over in bed (including adjusting bedclothes, sheets and blankets)?: A Little Difficulty moving from lying on back to sitting on the side of the bed? : A Little Difficulty sitting down on and standing up from a chair with arms (e.g., wheelchair, bedside commode, etc,.)?: A Little Help needed moving to and from a bed to chair (including a wheelchair)?: A Little Help needed walking in hospital room?: A Little Help needed climbing 3-5 steps with a railing? : A Lot 6 Click Score: 17    End of  Session Equipment Utilized During Treatment: Gait belt Activity Tolerance: Patient tolerated treatment well Patient left: in bed;with call bell/phone within reach;with family/visitor present Nurse Communication: Mobility status PT Visit Diagnosis: Unsteadiness on feet (R26.81);Muscle weakness (generalized) (M62.81);Ataxic gait (R26.0);Hemiplegia and hemiparesis Hemiplegia - Right/Left: Right Hemiplegia - dominant/non-dominant: Dominant Hemiplegia - caused by: Cerebral infarction    Time: 1350-1436 PT Time Calculation (min) (ACUTE ONLY): 46 min   Charges:   PT Evaluation $PT Eval Moderate Complexity: 1 Mod PT Treatments $Gait Training: 8-22 mins $Therapeutic Exercise: 8-22 mins   PT G Codes:   PT G-Codes **NOT FOR INPATIENT CLASS** Functional Assessment Tool Used: AM-PAC 6 Clicks Basic Mobility    Ramond Dial 10/06/2017, 2:44 PM   Mee Hives, PT MS Acute Rehab Dept. Number: [place] and [place]

## 2017-10-16 ENCOUNTER — Other Ambulatory Visit: Payer: Self-pay

## 2017-10-16 ENCOUNTER — Ambulatory Visit: Payer: Medicare PPO | Attending: Internal Medicine | Admitting: Occupational Therapy

## 2017-10-16 ENCOUNTER — Encounter: Payer: Self-pay | Admitting: Occupational Therapy

## 2017-10-16 DIAGNOSIS — R2681 Unsteadiness on feet: Secondary | ICD-10-CM | POA: Insufficient documentation

## 2017-10-16 DIAGNOSIS — R278 Other lack of coordination: Secondary | ICD-10-CM

## 2017-10-16 DIAGNOSIS — M6281 Muscle weakness (generalized): Secondary | ICD-10-CM | POA: Diagnosis not present

## 2017-10-16 NOTE — Therapy (Signed)
[place] MAIN [facility] SERVICES [address] [place], [place], [address] Phone: [phone]   Fax:  [phone]  Occupational Therapy Evaluation  Patient Details  Name: David Jennings MRN: [id number] Date of Birth: [birth year] Referring Provider: Dr. Loney Hering   Encounter Date: 10/16/2017  OT End of Session - 10/16/17 1038    Visit Number  1    Number of Visits  24    Date for OT Re-Evaluation  01/08/18    OT Start Time  0900    OT Stop Time  1015    OT Time Calculation (min)  75 min    Activity Tolerance  Patient tolerated treatment well    Behavior During Therapy  [facility] for tasks assessed/performed       Past Medical History:  Diagnosis Date  . Asthma   . Hypertension     Past Surgical History:  Procedure Laterality Date  . ANKLE SURGERY Right   . BACK SURGERY    . REPLACEMENT TOTAL KNEE BILATERAL    . TONSILLECTOMY      There were no vitals filed for this visit.  Subjective Assessment - 10/16/17 1026    Subjective   Pt. reportst that his wife has also recently had a CVA.    Pertinent History  Pt. is a 77 y.o. 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 Pt. was was admitted to [facility] where it was determined that he had an acute infarct in the left peremedian lower pons. Pt. presented with RUE wekness, incoordination, and Diplopia  at onset. Diplopia has resolved. pt. Pt. has a history of a right rotator cuff injury a year ago, and needs to have a reverse totall shoulder replacement, however has been recovering from ankle surgery this past year.    Patient Stated Goals  To get back to doing the things that he used to do.    Currently in Pain?  No/denies        [facility] OT Assessment - 10/16/17 0920      Assessment   Medical Diagnosis  CVA    Referring Provider  Dr. Beryle Quant    Onset Date/Surgical Date  10/02/17    Hand Dominance  Right    Prior Therapy  PT in acute care      Balance Screen   Has the patient fallen in the past 6  months  No    Has the patient had a decrease in activity level because of a fear of falling?   No    Is the patient reluctant to leave their home because of a fear of falling?   No      Home  Environment   Family/patient expects to be discharged to:  Private residence    Type of Home  Independent living facility [facility] Access  Level entry    [place]  One level    Bathroom Shower/Tub  Walk-in Shower;Curtain    Happy;Bedside commode;Walker - 2 wheels;Kasandra Knudsen - single point    Lives With  Spouse      Prior Function   Level of Independence  Independent    Vocation  Retired    Leisure  Reading, fresh water fishing      ADL   Eating/Feeding  Independent assist with loose peas    Grooming  -- increased time, and incoordination with self-feeding    Upper Body Bathing  Independent    Lower Body Bathing  Independent    Upper Body Dressing  Independent    Lower Body Dressing  Increased time    Toilet Transfer  Independent    Toileting -  Control and instrumentation engineer  Independent      IADL   Shopping  Takes care of all shopping needs independently    [place] alone or with occasional assistance    Meal Prep  Plans, prepares and serves adequate meals independently    Medication Management  Is responsible for taking medication in correct dosages at correct time Uses left hand to fill medication pillbox.    Financial Management  Manages financial matters independently (budgets, writes checks, pays rent, bills goes to bank), collects and keeps track of income      Written Expression   Dominant Hand  Right      Vision - History   Baseline Vision  Wears glasses all the time    Patient Visual Report  -- diplopia initially, has resolved      Activity Tolerance   Activity Tolerance  Tolerate 30+ min activity without fatigue      Cognition   Overall Cognitive Status  Within  Functional Limits for tasks assessed      Coordination   Right 9 Hole Peg Test  26    Left 9 Hole Peg Test  22      AROM   Overall AROM Comments   right shoulder flexion: 180, abduction:180      Strength   Overall Strength Comments  right shoulder flexion 4/5, abduction 4-/5, elbow flexion, extension 4+/5, wrist extension 5/5      Hand Function   Right Hand Grip (lbs)  69    Right Hand Lateral Pinch  16 lbs    Right Hand 3 Point Pinch  12 lbs    Left Hand Grip (lbs)  80    Left Hand Lateral Pinch  14 lbs    Left 3 point pinch  13 lbs                      OT Education - 10/16/17 1040    Education provided  Yes    Education Details  OT services, RUE functioning, goals    Person(s) Educated  Patient    Methods  Explanation    Comprehension  Verbalized understanding;Need further instruction          OT Long Term Goals - 10/16/17 1209      OT LONG TERM GOAL #1   Title  Pt. will improve [facility] skills to be able be able to independently grasp and move objects through his hand efficiently during ADL tasks 100% of the time.    Baseline  Pt. has difficulty with translatory movements of the hand.    Time  12    Period  Weeks    Status  New    Target Date  01/08/18      OT LONG TERM GOAL #2   Title  Pt. will improve right hand motor control to be able to brush his teeth independently, and efficiently.    Baseline  Pt. has incoordination with brushing teeth.    Time  12    Period  Weeks    Target Date  01/08/18      OT LONG TERM GOAL #3   Title  Pt. will write 4 sentences legibily, and efficiently in  preparation for IADL paperwork tasks.    Baseline  Pt. has difficulty    Time  12    Period  Weeks    Status  New    Target Date  01/08/18      OT LONG TERM GOAL #4   Title  Pt. will demonstrate good motor control, and coordination skills to be able to efficiently, and independently perform fluid hand to mouth patterns during self-feeding.    Baseline  Pt. has  difficulty    Time  12    Period  Weeks    Status  New    Target Date  01/08/18      OT LONG TERM GOAL #5   Title  Pt. will demonstrate brushing hair with Modified independence    Baseline  limited    Time  12    Period  Weeks    Status  New    Target Date  01/08/18            Plan - 10/16/17 1105    Clinical Impression Statement  Pt. is a 77 y.o. male who was admitted to [facility] with a CVA. Pt. presents with right sided weakness, and impaired fine motor coordination skills. Pt. has a previous right rotator cuff injury which has limited his ability to comb the left side, and back side of his head. Pt.'s right UE impairments from the CVA have limited his ability to brush his teeth, write, cut nails, wring out a towel,  pick up a 1/2 full pitcher, and use a spoon, or fork efficiently.  Pt. scored 74/80 on the MAM-20. Pt. could benefit from skilled OT services to improve RUE functioning during ADLs, and IADLs.     Occupational performance deficits (Please refer to evaluation for details):  ADL's;IADL's;Leisure    OT Frequency  2x / week    OT Duration  12 weeks    OT Treatment/Interventions  Self-care/ADL training;Therapeutic exercise;Neuromuscular education;Patient/family education;Energy conservation;DME and/or AE instruction;Therapeutic activities    Consulted and Agree with Plan of Care  Patient       Patient will benefit from skilled therapeutic intervention in order to improve the following deficits and impairments:  Decreased strength, Decreased balance, Decreased endurance, Decreased activity tolerance, Impaired UE functional use, Decreased coordination  Visit Diagnosis: Muscle weakness (generalized)  Other lack of coordination    Problem List Patient Active Problem List   Diagnosis Date Noted  . CVA (cerebrovascular accident) ([place]) 10/05/2017  . Accelerated hypertension 10/05/2017  . 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 Upper extremity weakness 10/05/2017     [name], MS, OTR/L 10/16/2017, 12:23 PM  [place] MAIN [facility] SERVICES [address] [place], [place], [address] Phone: [phone]   Fax:  [phone]  Name: BENUEL LY MRN: [id number] Date of Birth: 01/23/1941

## 2017-10-22 ENCOUNTER — Ambulatory Visit: Payer: Medicare PPO | Admitting: Occupational Therapy

## 2017-10-22 ENCOUNTER — Other Ambulatory Visit: Payer: Self-pay

## 2017-10-22 ENCOUNTER — Ambulatory Visit: Payer: Medicare PPO | Admitting: Physical Therapy

## 2017-10-22 ENCOUNTER — Encounter: Payer: Self-pay | Admitting: Occupational Therapy

## 2017-10-22 ENCOUNTER — Encounter: Payer: Self-pay | Admitting: Physical Therapy

## 2017-10-22 DIAGNOSIS — M6281 Muscle weakness (generalized): Secondary | ICD-10-CM | POA: Diagnosis not present

## 2017-10-22 DIAGNOSIS — R2681 Unsteadiness on feet: Secondary | ICD-10-CM

## 2017-10-22 DIAGNOSIS — R278 Other lack of coordination: Secondary | ICD-10-CM

## 2017-10-22 NOTE — Patient Instructions (Signed)
ABDUCTION: Sitting - Exercise Ball: Resistance Band (Active)   Sit with feet flat. With band tied around both legs, Lift right leg slightly and, against resistance band, draw it out to side. Complete __2_ sets of __10_ repetitions. Perform _2__ sessions per day.  Copyright  VHI. All rights reserved.  FLEXION: Sitting - Resistance Band (Active)   Sit, both feet flat. Have band tied around both legs above knees, lift right knee toward ceiling.Repeat with other knee Complete _2__ sets of _10__ repetitions. Perform _2__ sessions per day.  http://gtsc.exer.us/21   Knee Extension: Resisted (Sitting)   With band looped around right ankle and under other foot, straighten leg with ankle loop. Keep other leg bent to increase resistance. Repeat _10___ times per set. Do __2__ sets per session. Do _2___ sessions per day.  http://orth.exer.us/691   Copyright  VHI. All rights reserved.   Copyright  VHI. All rights reserved.  Copyright  VHI. All rights reserved.  FLEXION: Sitting - Resistance Band (Active)   Sit with right foot flat. Have band tied around both feet, bend ankle, bringing toes toward head. Complete __2_ sets of __10_ repetitions. Perform _2__ sessions per day.  HIP / KNEE: Extension - Sit to Stand   Sitting, lean chest forward, raise hips up from surface. Straighten hips and knees. Weight bear equally on left and right sides. Backs of legs should not push off surface. __10_ reps per set, __2_ sets per day, _5__ days per week Use assistive device as needed.   

## 2017-10-22 NOTE — Therapy (Signed)
[place] MAIN [facility] SERVICES [address] [place], [place], [address] Phone: [phone]   Fax:  [phone]  Physical Therapy Evaluation  Patient Details  Name: David Jennings MRN: [id number] Date of Birth: [birth year] Referring Provider: Cordelia Poche MD/babaoff, PCP   Encounter Date: 10/22/2017  PT End of Session - 10/22/17 1142    Visit Number  1    Number of Visits  13    Date for PT Re-Evaluation  12/03/17    Authorization Type  $40 copay each visit;     PT Start Time  1028    PT Stop Time  1125    PT Time Calculation (min)  57 min    Equipment Utilized During Treatment  Gait belt    Activity Tolerance  Patient tolerated treatment well    Behavior During Therapy  [facility] for tasks assessed/performed       Past Medical History:  Diagnosis Date  . Asthma   . Hypertension     Past Surgical History:  Procedure Laterality Date  . ANKLE SURGERY Right   . BACK SURGERY    . REPLACEMENT TOTAL KNEE BILATERAL    . TONSILLECTOMY      There were no vitals filed for this visit.   Subjective Assessment - 10/22/17 1029    Subjective  "I had a stroke in Jan but I have other issues too which could be limited."     Pertinent History  77 yo male s/p CVA on 10/02/17. Patient has PMH significant for right ankle replacement with spacer. Patient was released from [place] on 10/02/17 and was told that they were going to leave the spacer. When he got home he reports, "I felt weird. I started having double vision." Patient reports increased numbness in right hand; Patient called Primary MD who referred him to ED for possible CVA work-up; MRI showed acute infarct. He went for a follow up and also got a workup for A-fib which was negative. Patient is supposed to follow up with PCP in 6 months; PMH significant for RUE torn rotator cuff, arthritis in hands, weakness in ankle; patient is currently using [facility] for gait safety; He has been using cane since Jan 2019.  Patient reports that he is using cane as a precaution to help with steadiness;     How long can you sit comfortably?  NA    How long can you stand comfortably?  limited due to ankle issues (ankle replacement surgery x3); does wear compression hose on RLE    How long can you walk comfortably?  about a mile;     Diagnostic tests  MRI on 10/05/17 shows acute infarction affects the LEFT paramedian lower pons.     Patient Stated Goals  get back into the Stability class at [facility];     Currently in Pain?  No/denies         [facility] PT Assessment - 10/22/17 0001      Assessment   Medical Diagnosis  CVA    Referring Provider  Cordelia Poche MD/babaoff, PCP    Onset Date/Surgical Date  10/02/17    Hand Dominance  Right    Next MD Visit  6 months    Prior Therapy  PT in acute care; denies any outpatient PT for this condition;       Precautions   Precautions  Fall    Required Braces or Orthoses  -- soft ankle brace, RLE, wear when standing long times;  Restrictions   Weight Bearing Restrictions  No      Balance Screen   Has the patient fallen in the past 6 months  No    Has the patient had a decrease in activity level because of a fear of falling?   Yes    Is the patient reluctant to leave their home because of a fear of falling?   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 able to self correct with verbal cues;       AROM   Overall AROM Comments  BLE are [facility]      Strength   Overall Strength Comments  Hip: Flexion R: 4-/5, L: 4+/5, abduction/adduction 4/5 bilaterally, knee grossly 4/5 bilaterally, ankle: DF: R: 3+/5, L: 4/5      Palpation   Palpation comment  denies any tenderness to palpation;       Transfers   Comments  able to transfer sit<>Stand without pushing on chair; exhibits good mobility and control;       Ambulation/Gait   Gait Comments  ambulates with reciprocal gait pattern with and without SPC without antalgic gait pattern, good gait speed, good foot clearance;       6 Minute Walk- Baseline   BP (mmHg)  158/86    HR (bpm)  71    02 Sat (%RA)  97 %      6 Minute walk- Post Test   BP (mmHg)  198/92  (Abnormal)     HR (bpm)  108    02 Sat (%RA)  94 %    Modified Borg Scale for Dyspnea  3- Moderate shortness of breath or breathing difficulty      6 minute walk test results    Aerobic Endurance Distance Walked  1315    Endurance additional comments  with SPC, community ambulator, less than age group norms of 1600 feet      Standardized Balance Assessment   Five times sit to stand comments   13 sec without HHA (Low fall risk)    10 Meter Walk  1.17 with SPC community ambulator, low fall risk;      High Level Balance   High Level Balance Comments  able to lift leg for SLS but unable to hold >1 sec, denies and pain or discomfort; unable to hold tandem stance due to instability/impaired balance;              Objective measurements completed on examination: See above findings.    TREATMENT: Initiated HEP: Seated  with green tband around BLE: Hip flexion march x10 reps RLE Hip abduction/ER x10 reps bilaterally; LAQ x10 reps RLE only Ankle DF x10 reps RLE  Patient required  min-moderate verbal/tactile cues for correct exercise technique.           PT Education - 10/22/17 1142    Education provided  Yes    Education Details  HEP initiated, recommendations;     Person(s) Educated  Patient    Methods  Explanation;Demonstration;Verbal cues;Handout    Comprehension  Verbalized understanding;Returned demonstration;Verbal cues required;Need further instruction       PT Short Term Goals - 10/22/17 1152      PT SHORT TERM GOAL #1   Title  Patient will be adherent to HEP at least 3x a week to improve functional strength and balance for better safety at home.    Time  4    Period  Weeks    Status  New    Target Date  11/19/17      PT SHORT TERM GOAL #2   Title  Patient will improve RLE gross strength to 4+/5 to improve functional strength with gait and standing tasks for less fatigue with ADLs;     Time  4    Period  Weeks    Status  New    Target Date  11/19/17        PT Long Term Goals - 10/22/17 1153      PT LONG TERM GOAL #1   Title  Patient will be independent in home exercise program to improve strength/mobility for better functional independence with ADLs.    Time  6    Period  Weeks    Status  New    Target Date  12/03/17      PT LONG TERM GOAL #2   Title  Patient will tolerate 5 seconds of single leg stance without loss of balance to improve ability to get in and out of shower safely.    Time  6    Period  Weeks    Status  New    Target Date  12/03/17      PT LONG TERM GOAL #3   Title  Patient will increase Functional Gait Assessment score to >20/30 as to reduce fall risk and improve dynamic gait safety with community ambulation.    Time  6    Period  Weeks    Status  New    Target Date  12/03/17             Plan - 10/22/17 1142    Clinical Impression Statement  77 yo male reports increased weakness and impaired balance as related to recent CVA. Patient recently underwent multiple ankle surgeries in which he got an  ankle spacer implanted on RLE. He had just been cleared by the orthopedic MD on 10/02/17 and when he went home he started feeling bad. Patient followed up with primary MD who recommended he come to ED and get tests ran. MRI showed acute left Pons infarct. Patient does exhibit increased weakness on RLE as compared to left. He ambulates mod I with SPC with good reciprocal gait pattern. However his BP did increase quite a bit with 6 min walk test indicating low endurance. He also exhibits impaired standing balance. patient would benefit from additional skilled PT intervention to improve strength and balance and return to PLOF.     History and Personal Factors relevant to plan of care:  lives with wife in  single story villa, has good family support, still driving, independent in all ADLs; age, co-morbidities including HTN, recent ankle surgery    Clinical Presentation  Stable    Clinical Presentation due to:  patient's symptoms are localized to right side and are not progressing, exhibits good gait speed and transfer ability testing at lower fall risk;     Clinical Decision Making  Low    Rehab Potential  Good    Clinical Impairments Affecting Rehab Potential  motivated, has good family support; negative: recent ankle surgery- cleared by orthopedic;     PT Frequency  2x / week    PT Duration  6 weeks    PT Treatment/Interventions  Cryotherapy;Electrical Stimulation;Moist Heat;Therapeutic exercise;Therapeutic activities;Functional mobility training;Stair training;Gait training;DME Instruction;Balance training;Neuromuscular re-education;Patient/family education;Manual techniques;Taping;Energy conservation;Dry needling;Passive range of motion    PT Next Visit Plan  watch BP with exercise;     PT Home Exercise Plan  initiated- see patient instructions;     Recommended Other Services  getting OT    Consulted and Agree with Plan of Care  Patient       Patient will benefit from skilled therapeutic intervention  in order to improve the following deficits and impairments:  Decreased endurance, Cardiopulmonary status limiting activity, Decreased activity tolerance, Decreased strength, Decreased balance, Decreased mobility  Visit Diagnosis: Muscle weakness (generalized)  Unsteadiness on feet     Problem List Patient Active Problem List   Diagnosis Date Noted  . CVA (cerebrovascular accident) ([place]) 10/05/2017  . Accelerated hypertension 10/05/2017  . 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 Upper extremity weakness 10/05/2017    Alize Borrayo PT, DPT 10/22/2017, 11:54 AM  [place] MAIN [facility] SERVICES [address] [place], [place], [address] Phone: [phone]   Fax:  [phone]  Name: David Jennings MRN: [id number] Date of Birth: [birth year]

## 2017-10-22 NOTE — Addendum Note (Signed)
Addended by: Blanche East E on: 10/22/2017 11:56 AM   Modules accepted: Orders

## 2017-10-22 NOTE — Therapy (Signed)
[place] MAIN [facility] SERVICES [address] [place], [place], [address] Phone: [phone]   Fax:  [phone]  Occupational Therapy Treatment  Patient Details  Name: David Jennings MRN: [id number] Date of Birth: 09-17-1941 Referring Provider: Dr. Beryle Quant   Encounter Date: 10/22/2017    Past Medical History:  Diagnosis Date  . Asthma   . Hypertension     Past Surgical History:  Procedure Laterality Date  . ANKLE SURGERY Right   . BACK SURGERY    . REPLACEMENT TOTAL KNEE BILATERAL    . TONSILLECTOMY        OT TREATMENT    Neuro muscular re-education:  Therapeutic Exercise:  Pt. performed gross gripping with grip strengthener. Pt. worked on sustaining right grip while grasping pegs and reaching at various heights. Gripper was placed in the 3rd resistive slot with the white resistive spring. Pt. Performed hand strengthening with pink theraputty. Pt. required cues for proper technique. Pt. worked on gross grip loop, lateral pinch, 3pt. pinch, gross digit extension, digit extension table spread, digit abduction loop, single digit extension loop, thumb opposition, and lumbical ex,  Pt. Required sual demonstration, verbal and tactile cues for proper technique, and putty position.                                OT Long Term Goals - 10/16/17 1209      OT LONG TERM GOAL #1   Title  Pt. will improve [facility] skills to be able be able to independently grasp and move objects through his hand efficiently during ADL tasks 100% of the time.    Baseline  Pt. has difficulty with translatory movements of the hand.    Time  12    Period  Weeks    Status  New    Target Date  01/08/18      OT LONG TERM GOAL #2   Title  Pt. will improve right hand motor control to be able to brush his teeth independently, and efficiently.    Baseline  Pt. has incoordination with brushing teeth.    Time  12    Period  Weeks    Target Date   01/08/18      OT LONG TERM GOAL #3   Title  Pt. will write 4 sentences legibily, and efficiently in preparation for IADL paperwork tasks.    Baseline  Pt. has difficulty    Time  12    Period  Weeks    Status  New    Target Date  01/08/18      OT LONG TERM GOAL #4   Title  Pt. will demonstrate good motor control, and coordination skills to be able to efficiently, and independently perform fluid hand to mouth patterns during self-feeding.    Baseline  Pt. has difficulty    Time  12    Period  Weeks    Status  New    Target Date  01/08/18      OT LONG TERM GOAL #5   Title  Pt. will demonstrate brushing hair with Modified independence    Baseline  limited    Time  12    Period  Weeks    Status  New    Target Date  01/08/18              Patient will benefit from skilled therapeutic intervention in order to improve the  following deficits and impairments:     Visit Diagnosis: No diagnosis found.    Problem List Patient Active Problem List   Diagnosis Date Noted  . CVA (cerebrovascular accident) ([place]) 10/05/2017  . Accelerated hypertension 10/05/2017  . 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 Upper extremity weakness 10/05/2017    [name], MS, OTR/L 10/22/2017, 9:46 AM  [place] MAIN [facility] SERVICES [address] [place], [place], [address] Phone: [phone]   Fax:  [phone]  Name: David Jennings MRN: [id number] Date of Birth: 07-25-1941

## 2017-10-24 ENCOUNTER — Ambulatory Visit: Payer: Medicare PPO | Admitting: Physical Therapy

## 2017-10-24 ENCOUNTER — Encounter: Payer: Self-pay | Admitting: Physical Therapy

## 2017-10-24 ENCOUNTER — Encounter: Payer: Self-pay | Admitting: Occupational Therapy

## 2017-10-24 ENCOUNTER — Ambulatory Visit: Payer: Medicare PPO | Admitting: Occupational Therapy

## 2017-10-24 DIAGNOSIS — R2681 Unsteadiness on feet: Secondary | ICD-10-CM

## 2017-10-24 DIAGNOSIS — R278 Other lack of coordination: Secondary | ICD-10-CM

## 2017-10-24 DIAGNOSIS — M6281 Muscle weakness (generalized): Secondary | ICD-10-CM

## 2017-10-24 NOTE — Therapy (Signed)
[place] MAIN [facility] SERVICES [address] [place], [place], [address] Phone: [phone]   Fax:  [phone]  Physical Therapy Treatment  Patient Details  Name: David Jennings MRN: [id number] Date of Birth: March 23, 1941 Referring Provider: Cordelia Poche MD/babaoff, PCP   Encounter Date: 10/24/2017  PT End of Session - 10/24/17 0832    Visit Number  2    Number of Visits  13    Date for PT Re-Evaluation  12/03/17    Authorization Type  $40 copay each visit;     PT Start Time  0830    PT Stop Time  0910    PT Time Calculation (min)  40 min    Equipment Utilized During Treatment  Gait belt    Activity Tolerance  Patient tolerated treatment well    Behavior During Therapy  [facility] for tasks assessed/performed       Past Medical History:  Diagnosis Date  . Asthma   . Hypertension     Past Surgical History:  Procedure Laterality Date  . ANKLE SURGERY Right   . BACK SURGERY    . REPLACEMENT TOTAL KNEE BILATERAL    . TONSILLECTOMY      There were no vitals filed for this visit.  Subjective Assessment - 10/24/17 0830    Subjective  Patient has no pain today, just a little sore.     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 Patient has PMH significant for right ankle replacement with spacer. Patient was released from [place] on 10/02/17 and was told that they were going to leave the spacer. When he got home he reports, "I felt weird. I started having double vision." Patient reports increased numbness in right hand; Patient called Primary MD who referred him to ED for possible CVA work-up; MRI showed acute infarct. He went for a follow up and also got a workup for A-fib which was negative. Patient is supposed to follow up with PCP in 6 months; PMH significant for RUE torn rotator cuff, arthritis in hands, weakness in ankle; patient is currently using [facility] for gait safety; He has been using cane since Jan 2019. Patient reports that he is using  cane as a precaution to help with steadiness;     How long can you sit comfortably?  NA    How long can you stand comfortably?  limited due to ankle issues (ankle replacement surgery x3); does wear compression hose on RLE    How long can you walk comfortably?  about a mile;     Diagnostic tests  MRI on 10/05/17 shows acute infarction affects the LEFT paramedian lower pons.     Patient Stated Goals  get back into the Stability class at [facility];     Currently in Pain?  No/denies    Multiple Pain Sites  No             NMR:  Standing tandem on floor with head turns x 30 sec x 3 sets  Standing on purple foam with feet apart, feet together, x 2 minutes each, cues for technique and posture correction  Standing on purple foam modified  tandem with RLE in front, tandem with LLE in front, reaching across midline with cones stacking  X 12 cones x 3 sets , cues for technique and posture correction  Standing on 1/2  Foam, flat side down, with feet together stacking cones on low stool placed 2 feet fwd, cues for technique  and posture correction x 3 sets of 12 cones  Patient required tactile cueing and CGA during all dynamic standing balance activities. Patient required verbal and tactile cueing to maintain correct position during sidelying clamshell exercise.    Therapeutic Exercise:    Leg press x 20 x 2 130 lbs, RLE only and min cues to perform slowly and not snap his right knee  Matrix 17. 5 lbs x 5 reps fwd/ bwd, 2 reps side to side with CGA and mod VC  Patient needs mod  cues for longer backward steps and reciprocal stepping with min assist.                    PT Education - 10/24/17 0831    Education provided  Yes    Education Details  HEP review    Person(s) Educated  Patient    Methods  Explanation    Comprehension  Verbalized understanding       PT Short Term Goals - 10/22/17 1152      PT SHORT TERM GOAL #1   Title  Patient will be adherent to HEP at  least 3x a week to improve functional strength and balance for better safety at home.    Time  4    Period  Weeks    Status  New    Target Date  11/19/17      PT SHORT TERM GOAL #2   Title  Patient will improve RLE gross strength to 4+/5 to improve functional strength with gait and standing tasks for less fatigue with ADLs;     Time  4    Period  Weeks    Status  New    Target Date  11/19/17        PT Long Term Goals - 10/22/17 1153      PT LONG TERM GOAL #1   Title  Patient will be independent in home exercise program to improve strength/mobility for better functional independence with ADLs.    Time  6    Period  Weeks    Status  New    Target Date  12/03/17      PT LONG TERM GOAL #2   Title  Patient will tolerate 5 seconds of single leg stance without loss of balance to improve ability to get in and out of shower safely.    Time  6    Period  Weeks    Status  New    Target Date  12/03/17      PT LONG TERM GOAL #3   Title  Patient will increase Functional Gait Assessment score to >20/30 as to reduce fall risk and improve dynamic gait safety with community ambulation.    Time  6    Period  Weeks    Status  New    Target Date  12/03/17            Plan - 10/24/17 6160    Clinical Impression Statement  Despite reporting soreness in patients back patient tolerated all interventions well this session without an increase in pain.  Patient demonstrates imbalance with stepping activities and when balancing on uneven surfaces.  Patient will benefit from continued skilled PT interventions for improved strengthening and balance and decrease patients risk of falling.    Rehab Potential  Good    Clinical Impairments Affecting Rehab Potential  motivated, has good family support; negative: recent ankle surgery- cleared by orthopedic;     PT Frequency  2x / week    PT Duration  6 weeks    PT Treatment/Interventions  Cryotherapy;Electrical Stimulation;Moist Heat;Therapeutic  exercise;Therapeutic activities;Functional mobility training;Stair training;Gait training;DME Instruction;Balance training;Neuromuscular re-education;Patient/family education;Manual techniques;Taping;Energy conservation;Dry needling;Passive range of motion    PT Next Visit Plan  watch BP with exercise;     PT Home Exercise Plan  initiated- see patient instructions;     Consulted and Agree with Plan of Care  Patient       Patient will benefit from skilled therapeutic intervention in order to improve the following deficits and impairments:  Decreased endurance, Cardiopulmonary status limiting activity, Decreased activity tolerance, Decreased strength, Decreased balance, Decreased mobility  Visit Diagnosis: Muscle weakness (generalized)  Unsteadiness on feet  Other lack of coordination     Problem List Patient Active Problem List   Diagnosis Date Noted  . CVA (cerebrovascular accident) ([place]) 10/05/2017  . Accelerated hypertension 10/05/2017  . 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 Upper extremity weakness 10/05/2017    Alanson Puls, [place] DPT 10/24/2017, 8:34 AM  [place] MAIN [facility] SERVICES [address] [place], [place], [address] Phone: [phone]   Fax:  [phone]  Name: David Jennings MRN: [id number] Date of Birth: 03/17/1941

## 2017-10-24 NOTE — Therapy (Signed)
[name] [facility] SERVICES [address] [place], [place], [address] Phone: [phone]   Fax:  [phone]  Occupational Therapy Treatment  Patient Details  Name: David Jennings MRN: [id number] Date of Birth: [birth year] Referring Provider: Cordelia Poche MD/babaoff, PCP   Encounter Date: 10/24/2017  OT End of Session - 10/24/17 0939    Visit Number  3    Number of Visits  24    Date for OT Re-Evaluation  01/08/18    OT Start Time  0932    OT Stop Time  1015    OT Time Calculation (min)  43 min    Activity Tolerance  Patient tolerated treatment well    Behavior During Therapy  [facility] for tasks assessed/performed       Past Medical History:  Diagnosis Date  . Asthma   . Hypertension     Past Surgical History:  Procedure Laterality Date  . ANKLE SURGERY Right   . BACK SURGERY    . REPLACEMENT TOTAL KNEE BILATERAL    . TONSILLECTOMY      There were no vitals filed for this visit.   OT TREATMENT    Neuro muscular re-education:  Pt. performed [facility] tasks using the Grooved pegboard. Pt. worked on grasping the grooved pegs from a horizontal position, and moving the pegs to a vertical position in the hand to prepare for placing them in the grooved slot. Pt. Drooped multiple pegs from the palm.Pt. Worked on grasping coins from a tabletop surface, placing them into a resistive container, and pushing them through the slot while isolating his 2nd digit, while storing them in the palmar surface of his hand. Cues were required for to decrease compensation proximally, and leaning to the left with the trunk. Pt. Performed right hand [facility] skills to improve speed and dexterity needed for ADL tasks and writing. Pt. demonstrated grasping 1 inch sticks,  inch cylindrical collars, and  inch flat washers on the Purdue pegboard. Pt. performed grasping each item with his 2nd digit and thumb, and storing them in the palm.  Pt. Worked with the right, and  left hands simultaneously, and bilateral alternating hand patterns. Pt. presented with difficulty storing  inch objects at a time in the palmar aspect of the hand.                         OT Education - 10/24/17 [phone]    Education provided  Yes    Education Details  HEP    Person(s) Educated  Patient    Methods  Explanation    Comprehension  Verbalized understanding          OT Long Term Goals - 10/16/17 1209      OT LONG TERM GOAL #1   Title  Pt. will improve [facility] skills to be able be able to independently grasp and move objects through his hand efficiently during ADL tasks 100% of the time.    Baseline  Pt. has difficulty with translatory movements of the hand.    Time  12    Period  Weeks    Status  New    Target Date  01/08/18      OT LONG TERM GOAL #2   Title  Pt. will improve right hand motor control to be able to brush his teeth independently, and efficiently.    Baseline  Pt. has incoordination with brushing teeth.    Time  12  Period  Weeks    Target Date  01/08/18      OT LONG TERM GOAL #3   Title  Pt. will write 4 sentences legibily, and efficiently in preparation for IADL paperwork tasks.    Baseline  Pt. has difficulty    Time  12    Period  Weeks    Status  New    Target Date  01/08/18      OT LONG TERM GOAL #4   Title  Pt. will demonstrate good motor control, and coordination skills to be able to efficiently, and independently perform fluid hand to mouth patterns during self-feeding.    Baseline  Pt. has difficulty    Time  12    Period  Weeks    Status  New    Target Date  01/08/18      OT LONG TERM GOAL #5   Title  Pt. will demonstrate brushing hair with Modified independence    Baseline  limited    Time  12    Period  Weeks    Status  New    Target Date  01/08/18            Plan - 10/24/17 8453    Clinical Impression Statement  Pt. reports that he is doing the theraputty 3x's a day, after each meal. Pt. has  difficulty performing translatory movements, and storing objects in the right hand while using his thumb, and 2nd digit. Pt. continues to work on improving RUE [facility] for improved functional use during ADL, and IADL functioning.    Occupational performance deficits (Please refer to evaluation for details):  ADL's;IADL's;Leisure    OT Frequency  2x / week    OT Duration  12 weeks    OT Treatment/Interventions  Self-care/ADL training;Therapeutic exercise;Neuromuscular education;Patient/family education;Energy conservation;DME and/or AE instruction;Therapeutic activities    Consulted and Agree with Plan of Care  Patient       Patient will benefit from skilled therapeutic intervention in order to improve the following deficits and impairments:  Decreased strength, Decreased balance, Decreased endurance, Decreased activity tolerance, Impaired UE functional use, Decreased coordination  Visit Diagnosis: Muscle weakness (generalized)  Other lack of coordination    Problem List Patient Active Problem List   Diagnosis Date Noted  . CVA (cerebrovascular accident) ([place]) 10/05/2017  . Accelerated hypertension 10/05/2017  . 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 Upper extremity weakness 10/05/2017    David Carina, MS, OTR/L 10/24/2017, 9:50 AM  [place] MAIN [facility] SERVICES [address] [place], [place], [address] Phone: [phone]   Fax:  [phone]  Name: David Jennings MRN: [id number] Date of Birth: 14-Mar-1941

## 2017-10-28 ENCOUNTER — Encounter: Payer: Self-pay | Admitting: Occupational Therapy

## 2017-10-28 ENCOUNTER — Ambulatory Visit: Payer: Medicare PPO | Admitting: Occupational Therapy

## 2017-10-28 ENCOUNTER — Ambulatory Visit: Payer: Medicare PPO | Admitting: Physical Therapy

## 2017-10-28 ENCOUNTER — Encounter: Payer: Self-pay | Admitting: Physical Therapy

## 2017-10-28 DIAGNOSIS — M6281 Muscle weakness (generalized): Secondary | ICD-10-CM

## 2017-10-28 DIAGNOSIS — R278 Other lack of coordination: Secondary | ICD-10-CM

## 2017-10-28 DIAGNOSIS — R2681 Unsteadiness on feet: Secondary | ICD-10-CM

## 2017-10-28 NOTE — Therapy (Signed)
[place] MAIN [facility] SERVICES [address] [place], [place], [address] Phone: [phone]   Fax:  [phone]  Physical Therapy Treatment  Patient Details  Name: David Jennings MRN: [id number] Date of Birth: 1941/04/09 Referring Provider: Cordelia Poche MD/babaoff, PCP   Encounter Date: 10/28/2017  PT End of Session - 10/28/17 1022    Visit Number  3    Number of Visits  13    Date for PT Re-Evaluation  12/03/17    Authorization Type  $40 copay each visit;     PT Start Time  1020    PT Stop Time  1100    PT Time Calculation (min)  40 min    Equipment Utilized During Treatment  Gait belt    Activity Tolerance  Patient tolerated treatment well    Behavior During Therapy  [facility] for tasks assessed/performed       Past Medical History:  Diagnosis Date  . Asthma   . Hypertension     Past Surgical History:  Procedure Laterality Date  . ANKLE SURGERY Right   . BACK SURGERY    . REPLACEMENT TOTAL KNEE BILATERAL    . TONSILLECTOMY      There were no vitals filed for this visit.  Subjective Assessment - 10/28/17 1021    Subjective  Patient has no pain today, just a little sore.     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 Patient has PMH significant for right ankle replacement with spacer. Patient was released from [facility] on 10/02/17 and was told that they were going to leave the spacer. When he got home he reports, "I felt weird. I started having double vision." Patient reports increased numbness in right hand; Patient called Primary MD who referred him to ED for possible CVA work-up; MRI showed acute infarct. He went for a follow up and also got a workup for A-fib which was negative. Patient is supposed to follow up with PCP in 6 months; PMH significant for RUE torn rotator cuff, arthritis in hands, weakness in ankle; patient is currently using [facility] for gait safety; He has been using cane since Jan 2019. Patient reports that he is using  cane as a precaution to help with steadiness;     How long can you sit comfortably?  NA    How long can you stand comfortably?  limited due to ankle issues (ankle replacement surgery x3); does wear compression hose on RLE    How long can you walk comfortably?  about a mile;     Diagnostic tests  MRI on 10/05/17 shows acute infarction affects the LEFT paramedian lower pons.     Patient Stated Goals  get back into the Stability class at [facility];     Currently in Pain?  No/denies    Multiple Pain Sites  No       Therapeutic exercise and neuromuscular training:  Leg press with 90 lbs x 20 x 2 VC for slowly  1/2 foam flat side up and balance with  feet apart and feet together; VC for posture correction   tandem standing on floor with head turns and occasional UE support, VC for posture correction   side stepping left and right balance foam in parallel bars 10 feet x 3, VC for posture correction, down and regain balance each step  step ups from floor to 6 inch stool x 20 bilateral  Tilt board fwd/bwd, side to side left and right,  VC for posture correction  Stepping over bolster left and right and fwd/bwd, VC for posture correction  Tm walking 1. 0  m/hour x 5 mins, elevation 1  CGA and Min to mod verbal cues used throughout with increased in postural sway and LOB most seen with narrow base of support and while on uneven surfaces. Continues to have balance deficits typical with diagnosis. Patient performs intermediate level exercises without pain behaviors and needs verbal cuing for postural alignment and head positioning                      PT Education - 10/28/17 1022    Education provided  Yes    Education Details  Review HEP    Person(s) Educated  Patient    Methods  Explanation    Comprehension  Verbalized understanding       PT Short Term Goals - 10/22/17 1152      PT SHORT TERM GOAL #1   Title  Patient will be adherent to HEP at least 3x a week to  improve functional strength and balance for better safety at home.    Time  4    Period  Weeks    Status  New    Target Date  11/19/17      PT SHORT TERM GOAL #2   Title  Patient will improve RLE gross strength to 4+/5 to improve functional strength with gait and standing tasks for less fatigue with ADLs;     Time  4    Period  Weeks    Status  New    Target Date  11/19/17        PT Long Term Goals - 10/22/17 1153      PT LONG TERM GOAL #1   Title  Patient will be independent in home exercise program to improve strength/mobility for better functional independence with ADLs.    Time  6    Period  Weeks    Status  New    Target Date  12/03/17      PT LONG TERM GOAL #2   Title  Patient will tolerate 5 seconds of single leg stance without loss of balance to improve ability to get in and out of shower safely.    Time  6    Period  Weeks    Status  New    Target Date  12/03/17      PT LONG TERM GOAL #3   Title  Patient will increase Functional Gait Assessment score to >20/30 as to reduce fall risk and improve dynamic gait safety with community ambulation.    Time  6    Period  Weeks    Status  New    Target Date  12/03/17            Plan - 10/28/17 1024    Clinical Impression Statement  Patient demonstrates LOB with standing balance exercises indicating decreased balancing strategies. Patient did not require UE support to perform sidestepping on foam but did lose his balance with head movement during most balance exercises. Patient will benefit from further skilled therapy to return to prior level of function.  Pt was encouraged to perform HEP during the week in order to continue progressing balance and strength interventions.  Pt would continue to benefit from skilled therapy services in order to further address LE strength deficits and balance deficits in order to decrease fall risk and improve mobility.    Rehab Potential  Good    Clinical Impairments Affecting Rehab  Potential  motivated, has good family support; negative: recent ankle surgery- cleared by orthopedic;     PT Frequency  2x / week    PT Duration  6 weeks    PT Treatment/Interventions  Cryotherapy;Electrical Stimulation;Moist Heat;Therapeutic exercise;Therapeutic activities;Functional mobility training;Stair training;Gait training;DME Instruction;Balance training;Neuromuscular re-education;Patient/family education;Manual techniques;Taping;Energy conservation;Dry needling;Passive range of motion    PT Next Visit Plan  watch BP with exercise;     PT Home Exercise Plan  initiated- see patient instructions;     Consulted and Agree with Plan of Care  Patient       Patient will benefit from skilled therapeutic intervention in order to improve the following deficits and impairments:  Decreased endurance, Cardiopulmonary status limiting activity, Decreased activity tolerance, Decreased strength, Decreased balance, Decreased mobility  Visit Diagnosis: Muscle weakness (generalized)  Other lack of coordination  Unsteadiness on feet     Problem List Patient Active Problem List   Diagnosis Date Noted  . CVA (cerebrovascular accident) ([place]) 10/05/2017  . Accelerated hypertension 10/05/2017  . 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 Upper extremity weakness 10/05/2017    Alanson Puls, [place] DPT 10/28/2017, 10:28 AM  [place] MAIN [facility] SERVICES [address] [place], [place], [address] Phone: [phone]   Fax:  [phone]  Name: David Jennings MRN: [id number] Date of Birth: [birth year]

## 2017-10-28 NOTE — Therapy (Signed)
[place] MAIN [facility] SERVICES [address] [place], [place], [address] Phone: [phone]   Fax:  [phone]  Occupational Therapy Treatment  Patient Details  Name: David Jennings MRN: [id number] Date of Birth: [birth year] Referring Provider: Cordelia Poche MD/babaoff, PCP   Encounter Date: 10/28/2017  OT End of Session - 10/28/17 0941    Visit Number  4    Number of Visits  24    Date for OT Re-Evaluation  01/08/18    OT Start Time  0931    OT Stop Time  1015    OT Time Calculation (min)  44 min    Activity Tolerance  Patient tolerated treatment well    Behavior During Therapy  [facility] for tasks assessed/performed       Past Medical History:  Diagnosis Date  . Asthma   . Hypertension     Past Surgical History:  Procedure Laterality Date  . ANKLE SURGERY Right   . BACK SURGERY    . REPLACEMENT TOTAL KNEE BILATERAL    . TONSILLECTOMY      There were no vitals filed for this visit.  Subjective Assessment - 10/28/17 0939    Subjective   Pt. reports he has been doing the putty exercises at home.    Pertinent History  Pt. is a 77 y.o. 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 Pt. was was admitted to [facility] where it was determined that he had an acute infarct in the left peremedian lower pons. Pt. presented with RUE wekness, incoordination, and Diplopia  at onset. Diplopia has resolved. pt. Pt. has a history of a right rotator cuff injury a year ago, and needs to have a reverse totall shoulder replacement, however has been recovering from ankle surgery this past year.    Patient Stated Goals  To get back to doing the things that he used to do.    Currently in Pain?  No/denies    Multiple Pain Sites  No      OT TREATMENT    Neuro muscular re-education:  Pt. worked on grasping 1/4 objects from green resistive putty with his right hand. Pt. worked on holding, and stabilizing the putty with his Left hand. Pt. worked on translatory  movements of the hand moving objects from the palm of his hand to the tip of his 2nd digit, and thumb. Pt. worked on tasks and activities to improve the ulnar aspect of the hand. Pt worked with objects of various textures from sticky back circular objects, foam circular objects, smooth circular objects, and small rolls. Pt. has difficulty with storing objects in the ulnar aspect of the hand.                          OT Education - 10/28/17 1110    Education provided  Yes    Education Details  [facility]    Person(s) Educated  Patient    Methods  Explanation    Comprehension  Verbalized understanding          OT Long Term Goals - 10/16/17 1209      OT LONG TERM GOAL #1   Title  Pt. will improve [facility] skills to be able be able to independently grasp and move objects through his hand efficiently during ADL tasks 100% of the time.    Baseline  Pt. has difficulty with translatory movements of the hand.    Time  12  Period  Weeks    Status  New    Target Date  01/08/18      OT LONG TERM GOAL #2   Title  Pt. will improve right hand motor control to be able to brush his teeth independently, and efficiently.    Baseline  Pt. has incoordination with brushing teeth.    Time  12    Period  Weeks    Target Date  01/08/18      OT LONG TERM GOAL #3   Title  Pt. will write 4 sentences legibily, and efficiently in preparation for IADL paperwork tasks.    Baseline  Pt. has difficulty    Time  12    Period  Weeks    Status  New    Target Date  01/08/18      OT LONG TERM GOAL #4   Title  Pt. will demonstrate good motor control, and coordination skills to be able to efficiently, and independently perform fluid hand to mouth patterns during self-feeding.    Baseline  Pt. has difficulty    Time  12    Period  Weeks    Status  New    Target Date  01/08/18      OT LONG TERM GOAL #5   Title  Pt. will demonstrate brushing hair with Modified independence    Baseline  limited     Time  12    Period  Weeks    Status  New    Target Date  01/08/18            Plan - 10/28/17 0943    Clinical Impression Statement Pt. continues to work on improving right hand fine motor coordination skills. Pt. presents with limited right hand [facility] skills. Pt. is improving with translatory movements of the hand, however has difficulty storing objects in the ulnar aspect of the hand while using his thumb, and second digit during tasks. Pt. continues to work on improving [facility], and hand function for improved ADL, and IADL tasks.    Occupational performance deficits (Please refer to evaluation for details):  ADL's;IADL's;Leisure    OT Frequency  2x / week    OT Duration  12 weeks    OT Treatment/Interventions  Self-care/ADL training;Therapeutic exercise;Neuromuscular education;Patient/family education;Energy conservation;DME and/or AE instruction;Therapeutic activities    Consulted and Agree with Plan of Care  Patient       Patient will benefit from skilled therapeutic intervention in order to improve the following deficits and impairments:  Decreased strength, Decreased balance, Decreased endurance, Decreased activity tolerance, Impaired UE functional use, Decreased coordination  Visit Diagnosis: Muscle weakness (generalized)  Other lack of coordination    Problem List Patient Active Problem List   Diagnosis Date Noted  . CVA (cerebrovascular accident) ([place]) 10/05/2017  . Accelerated hypertension 10/05/2017  . 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 Upper extremity weakness 10/05/2017    [name], MS, OTR/L 10/28/2017, 11:21 AM  [place] MAIN [facility] SERVICES [address] [place], [place], [address] Phone: [phone]   Fax:  [phone]  Name: David Jennings MRN: [id number] Date of Birth: 06-Sep-1941

## 2017-10-31 ENCOUNTER — Ambulatory Visit: Payer: Medicare PPO | Admitting: Physical Therapy

## 2017-10-31 ENCOUNTER — Ambulatory Visit: Payer: Medicare PPO | Admitting: Occupational Therapy

## 2017-10-31 ENCOUNTER — Encounter: Payer: Self-pay | Admitting: Occupational Therapy

## 2017-10-31 ENCOUNTER — Encounter: Payer: Self-pay | Admitting: Physical Therapy

## 2017-10-31 DIAGNOSIS — R2681 Unsteadiness on feet: Secondary | ICD-10-CM

## 2017-10-31 DIAGNOSIS — R278 Other lack of coordination: Secondary | ICD-10-CM

## 2017-10-31 DIAGNOSIS — M6281 Muscle weakness (generalized): Secondary | ICD-10-CM | POA: Diagnosis not present

## 2017-10-31 NOTE — Therapy (Signed)
[place] MAIN [facility] SERVICES [address] [place], [place], [address] Phone: [phone]   Fax:  [phone]  Physical Therapy Treatment  Patient Details  Name: David Jennings MRN: [id number] Date of Birth: Jun 14, 1941 Referring Provider: Cordelia Poche MD/babaoff, PCP   Encounter Date: 10/31/2017  PT End of Session - 10/31/17 1025    Visit Number  4    Number of Visits  13    Date for PT Re-Evaluation  12/03/17    Authorization Type  $40 copay each visit;     PT Start Time  1020    PT Stop Time  1100    PT Time Calculation (min)  40 min    Equipment Utilized During Treatment  Gait belt    Activity Tolerance  Patient tolerated treatment well    Behavior During Therapy  [facility] for tasks assessed/performed       Past Medical History:  Diagnosis Date  . Asthma   . Hypertension     Past Surgical History:  Procedure Laterality Date  . ANKLE SURGERY Right   . BACK SURGERY    . REPLACEMENT TOTAL KNEE BILATERAL    . TONSILLECTOMY      There were no vitals filed for this visit.  Subjective Assessment - 10/31/17 1023    Subjective  Patient has no pain today, just a little sore.     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 Patient has PMH significant for right ankle replacement with spacer. Patient was released from [facility] on 10/02/17 and was told that they were going to leave the spacer. When he got home he reports, "I felt weird. I started having double vision." Patient reports increased numbness in right hand; Patient called Primary MD who referred him to ED for possible CVA work-up; MRI showed acute infarct. He went for a follow up and also got a workup for A-fib which was negative. Patient is supposed to follow up with PCP in 6 months; PMH significant for RUE torn rotator cuff, arthritis in hands, weakness in ankle; patient is currently using [facility] for gait safety; He has been using cane since Jan 2019. Patient reports that he is using  cane as a precaution to help with steadiness;     How long can you sit comfortably?  NA    How long can you stand comfortably?  limited due to ankle issues (ankle replacement surgery x3); does wear compression hose on RLE    How long can you walk comfortably?  about a mile;     Diagnostic tests  MRI on 10/05/17 shows acute infarction affects the LEFT paramedian lower pons.     Patient Stated Goals  get back into the Stability class at [facility];     Currently in Pain?  No/denies    Multiple Pain Sites  No       Treatment:  octtane fitness x 5 mins ( warm up)  SLS with 1 rail assist 10 sec hold x2 bilaterally;  work on standing on single leg for at least 10 sec to improve strength and tolerance with standing on one leg x 20 times each LE  Tandem stance modified unsupported 10 sec hold x 15 reps each foot in front; demonstrates good stance control with minimal lateral loss of balance/unsteadiness;   Lunges on BOSU ball x 15 reps left and right with cues for better control   Lunge position on BOSU ball and rotate the ball left  and right with cues  To turn his trunk and rotate his body x 15 left foot on BOSU ball and right foot on BOSU ball  Rocker board horizontal and UE extended and trunk rotation left and right x 15, cues to slow down and for control  1/2 foam tandem stand without UE support  X 3 mins; patient vc for keeping head up and better posture , right foot fwd, left foot fwd  Matrix 22.5 bwd / fwd /side to side stepping x 5 with CGA and vc for slow controlled steps  Leg press 90 lbs x 20 x 2 single leg , cues to control the speed and not snap the knee                           PT Education - 10/31/17 1024    Education provided  Yes    Education Details  Balance and safety for HEP    Person(s) Educated  Patient    Methods  Explanation;Demonstration    Comprehension  Verbalized understanding;Returned demonstration       PT Short Term Goals -  10/22/17 1152      PT SHORT TERM GOAL #1   Title  Patient will be adherent to HEP at least 3x a week to improve functional strength and balance for better safety at home.    Time  4    Period  Weeks    Status  New    Target Date  11/19/17      PT SHORT TERM GOAL #2   Title  Patient will improve RLE gross strength to 4+/5 to improve functional strength with gait and standing tasks for less fatigue with ADLs;     Time  4    Period  Weeks    Status  New    Target Date  11/19/17        PT Long Term Goals - 10/22/17 1153      PT LONG TERM GOAL #1   Title  Patient will be independent in home exercise program to improve strength/mobility for better functional independence with ADLs.    Time  6    Period  Weeks    Status  New    Target Date  12/03/17      PT LONG TERM GOAL #2   Title  Patient will tolerate 5 seconds of single leg stance without loss of balance to improve ability to get in and out of shower safely.    Time  6    Period  Weeks    Status  New    Target Date  12/03/17      PT LONG TERM GOAL #3   Title  Patient will increase Functional Gait Assessment score to >20/30 as to reduce fall risk and improve dynamic gait safety with community ambulation.    Time  6    Period  Weeks    Status  New    Target Date  12/03/17            Plan - 10/31/17 1025    Clinical Impression Statement  Dynamic and static balance interventions continued today, with a focus on unilateral LE stability.  Pt tolerated all exercises well, but required increased rest breaks secondary to increased LE fatigue and weakness today.  LE strength exercises were progressed today with focus on unilateral strength and stability to focus on increasing LE strength and stability with daily tasks.  Pt was encouraged to perform HEP during the week in order to continue progressing balance and strength interventions.  Pt would continue to benefit from skilled therapy services in order to further address LE  strength deficits and balance deficits in order to decrease fall risk and improve mobility.    Rehab Potential  Good    Clinical Impairments Affecting Rehab Potential  motivated, has good family support; negative: recent ankle surgery- cleared by orthopedic;     PT Frequency  2x / week    PT Duration  6 weeks    PT Treatment/Interventions  Cryotherapy;Electrical Stimulation;Moist Heat;Therapeutic exercise;Therapeutic activities;Functional mobility training;Stair training;Gait training;DME Instruction;Balance training;Neuromuscular re-education;Patient/family education;Manual techniques;Taping;Energy conservation;Dry needling;Passive range of motion    PT Next Visit Plan  watch BP with exercise;     PT Home Exercise Plan  initiated- see patient instructions;     Consulted and Agree with Plan of Care  Patient       Patient will benefit from skilled therapeutic intervention in order to improve the following deficits and impairments:  Decreased endurance, Cardiopulmonary status limiting activity, Decreased activity tolerance, Decreased strength, Decreased balance, Decreased mobility  Visit Diagnosis: Muscle weakness (generalized)  Other lack of coordination  Unsteadiness on feet     Problem List Patient Active Problem List   Diagnosis Date Noted  . CVA (cerebrovascular accident) ([place]) 10/05/2017  . Accelerated hypertension 10/05/2017  . 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 Upper extremity weakness 10/05/2017    Alanson Puls, [place] DPT 10/31/2017, 10:31 AM  [place] MAIN [facility] SERVICES [address] [place], [place], [address] Phone: [phone]   Fax:  [phone]  Name: David Jennings MRN: [id number] Date of Birth: 01/26/1941

## 2017-10-31 NOTE — Therapy (Signed)
[place] MAIN [facility] SERVICES [address] [place], [place], [address] Phone: [phone]   Fax:  [phone]  Occupational Therapy Treatment  Patient Details  Name: David Jennings MRN: [id number] Date of Birth: [birth year] Referring Provider: Cordelia Poche MD/babaoff, PCP   Encounter Date: 10/31/2017  OT End of Session - 10/31/17 0946    Visit Number  5    Number of Visits  24    Date for OT Re-Evaluation  01/08/18    OT Start Time  0934    OT Stop Time  1015    OT Time Calculation (min)  41 min    Activity Tolerance  Patient tolerated treatment well    Behavior During Therapy  [facility] for tasks assessed/performed       Past Medical History:  Diagnosis Date  . Asthma   . Hypertension     Past Surgical History:  Procedure Laterality Date  . ANKLE SURGERY Right   . BACK SURGERY    . REPLACEMENT TOTAL KNEE BILATERAL    . TONSILLECTOMY      There were no vitals filed for this visit.  Subjective Assessment - 10/31/17 0945    Subjective   Pt. conitnues to do the exercises at home    Pertinent History  Pt. is a 77 y.o. 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 Pt. was was admitted to [facility] where it was determined that he had an acute infarct in the left peremedian lower pons. Pt. presented with RUE wekness, incoordination, and Diplopia  at onset. Diplopia has resolved. pt. Pt. has a history of a right rotator cuff injury a year ago, and needs to have a reverse totall shoulder replacement, however has been recovering from ankle surgery this past year.    Patient Stated Goals  To get back to doing the things that he used to do.    Currently in Pain?  No/denies      OT TREATMENT    Neuro muscular re-education:  Pt. Worked on grasping coins from a tabletop surface, placing them into a resistive container, and pushing them through the slot while isolating his 2nd digit. Pt. performed [facility] skills training to improve speed and dexterity  needed for ADL tasks and writing. Pt. demonstrated grasping 1 inch sticks,  inch cylindrical collars, and  inch flat washers on the Purdue pegboard. Pt. performed grasping each item with her 2nd digit and thumb, and storing them in the palm. Pt. presented with difficulty storing  inch objects at a time in the palmar aspect of the hand.  Therapeutic Exercise:  Pt. performed gross gripping with grip strengthener. Pt. worked on sustaining grip while grasping pegs and reaching at various heights. Gripper was placed in the 3rd resistive slot with the white resistive spring. Pt. Worked on pinch strengthening in the right hand for lateral, and 3pt. pinch using yellow, red, green, and blue resistive clips. Pt. worked on placing the clips at various vertical and horizontal angles. Tactile and verbal cues were required for eliciting the desired movement.                         OT Education - 10/31/17 0946    Education provided  Yes    Education Details  [facility]    Person(s) Educated  Patient    Methods  Explanation    Comprehension  Verbalized understanding          OT Long Term  Goals - 10/16/17 1209      OT LONG TERM GOAL #1   Title  Pt. will improve [facility] skills to be able be able to independently grasp and move objects through his hand efficiently during ADL tasks 100% of the time.    Baseline  Pt. has difficulty with translatory movements of the hand.    Time  12    Period  Weeks    Status  New    Target Date  01/08/18      OT LONG TERM GOAL #2   Title  Pt. will improve right hand motor control to be able to brush his teeth independently, and efficiently.    Baseline  Pt. has incoordination with brushing teeth.    Time  12    Period  Weeks    Target Date  01/08/18      OT LONG TERM GOAL #3   Title  Pt. will write 4 sentences legibily, and efficiently in preparation for IADL paperwork tasks.    Baseline  Pt. has difficulty    Time  12    Period  Weeks    Status   New    Target Date  01/08/18      OT LONG TERM GOAL #4   Title  Pt. will demonstrate good motor control, and coordination skills to be able to efficiently, and independently perform fluid hand to mouth patterns during self-feeding.    Baseline  Pt. has difficulty    Time  12    Period  Weeks    Status  New    Target Date  01/08/18      OT LONG TERM GOAL #5   Title  Pt. will demonstrate brushing hair with Modified independence    Baseline  limited    Time  12    Period  Weeks    Status  New    Target Date  01/08/18            Plan - 10/31/17 0947    Clinical Impression Statement  Pt. reports he is working on grasping washers, and screws at home. Pt. continues to work on improving RUE strength, and coordination skills to improve engagement in ADL, and IADL tasks.     Occupational performance deficits (Please refer to evaluation for details):  ADL's;IADL's;Leisure    OT Frequency  2x / week    OT Duration  12 weeks    OT Treatment/Interventions  Self-care/ADL training;Therapeutic exercise;Neuromuscular education;Patient/family education;Energy conservation;DME and/or AE instruction;Therapeutic activities    Consulted and Agree with Plan of Care  Patient       Patient will benefit from skilled therapeutic intervention in order to improve the following deficits and impairments:  Decreased strength, Decreased balance, Decreased endurance, Decreased activity tolerance, Impaired UE functional use, Decreased coordination  Visit Diagnosis: Muscle weakness (generalized)  Other lack of coordination    Problem List Patient Active Problem List   Diagnosis Date Noted  . CVA (cerebrovascular accident) ([place]) 10/05/2017  . Accelerated hypertension 10/05/2017  . 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 Upper extremity weakness 10/05/2017    [name], MS, OTR/L 10/31/2017, 9:58 AM  [place] MAIN [facility] SERVICES [address] [place],  [place], [address] Phone: [phone]   Fax:  [phone]  Name: David Jennings MRN: [id number] Date of Birth: April 21, 1941

## 2017-11-04 ENCOUNTER — Ambulatory Visit: Payer: Medicare PPO | Attending: Cardiovascular Disease | Admitting: Physical Therapy

## 2017-11-04 ENCOUNTER — Ambulatory Visit: Payer: Medicare PPO | Admitting: Occupational Therapy

## 2017-11-04 ENCOUNTER — Encounter: Payer: Self-pay | Admitting: Occupational Therapy

## 2017-11-04 ENCOUNTER — Encounter: Payer: Self-pay | Admitting: Physical Therapy

## 2017-11-04 VITALS — BP 167/93

## 2017-11-04 DIAGNOSIS — R278 Other lack of coordination: Secondary | ICD-10-CM | POA: Diagnosis present

## 2017-11-04 DIAGNOSIS — R2681 Unsteadiness on feet: Secondary | ICD-10-CM | POA: Diagnosis present

## 2017-11-04 DIAGNOSIS — M6281 Muscle weakness (generalized): Secondary | ICD-10-CM

## 2017-11-04 NOTE — Patient Instructions (Signed)
Balance, Proprioception: Hip Abduction With Tubing   With tubing attached to both ankles, Standing holding onto counter, kick one leg out to side and then Return.  Repeat _10___ times  On each side.  Do ___2_ sessions per day.  http://cc.exer.us/20    Balance, Proprioception: Hip Extension With Tubing   With tubing tied around both legs, holding onto kitchen counter, swing leg back. Return. Repeat _10___ times . Do __2__ sessions per day.  http://cc.exer.us/19   Copyright  VHI. All rights reserved.  Balance, Proprioception: Hip Flexion With Tubing   With tubing attached to both ankles, swing leg forward. Return. Repeat _10___ times. Do __2__ sessions per day.  http://cc.exer.us/18   Copyright  VHI. All rights reserved.  Band Walk: Side Stepping   Tie band around legs, AROUND ANKLES. Step _10__ feet to one side, then step back to start. Repeat _2-3__ feet per session. Note: Small towel between band and skin eases rubbing.  http://plyo.exer.us/76   Backward Walking   Walk backward, toes of each foot coming down first. Take long, even strides. Make sure you have a clear pathway with no obstructions when you do this. Stand beside counter and walk backward  And then walk forward doing opposite directions; repeat 10 laps 2x a day at least 5 days a week.  Copyright  VHI. All rights reserved.  Tandem Walking   Stand beside kitchen sink and place one foot in front of the other, lift your hand and try to hold position for 10 sec. Repeat with other foot in front; Repeat 5 reps with each foot in front 5 days a week.Balance: Unilateral   Attempt to balance on left leg, eyes open. Hold _5-10___ seconds.Start with holding onto counter and if you get your balance you can try to let go of counter. Repeat __5__ times per set. Do __1__ sets per session. Do __1__ sessions per day. Keep eyes open:   http://orth.exer.us/29   Copyright  VHI. All rights reserved.

## 2017-11-04 NOTE — Therapy (Signed)
[place] MAIN [facility] SERVICES [address] [place], [place], [address] Phone: [phone]   Fax:  [phone]  Physical Therapy Treatment  Patient Details  Name: David Jennings MRN: [id number] Date of Birth: Mar 10, 1941 Referring Provider: Cordelia Poche MD/babaoff, PCP   Encounter Date: 11/04/2017  PT End of Session - 11/04/17 1256    Visit Number  5    Number of Visits  13    Date for PT Re-Evaluation  12/03/17    Authorization Type  $40 copay each visit;     PT Start Time  1300    PT Stop Time  1345    PT Time Calculation (min)  45 min    Equipment Utilized During Treatment  Gait belt    Activity Tolerance  Patient tolerated treatment well    Behavior During Therapy  [facility] for tasks assessed/performed       Past Medical History:  Diagnosis Date  . Asthma   . Hypertension     Past Surgical History:  Procedure Laterality Date  . ANKLE SURGERY Right   . BACK SURGERY    . REPLACEMENT TOTAL KNEE BILATERAL    . 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 Patient has PMH significant for right ankle replacement with spacer. Patient was released from [facility] on 10/02/17 and was told that they were going to leave the spacer. When he got home he reports, "I felt weird. I started having double vision." Patient reports increased numbness in right hand; Patient called Primary MD who referred him to ED for possible CVA work-up; MRI showed acute infarct. He went for a follow up and also got a workup for A-fib which was negative. Patient is supposed to follow up with PCP in 6 months; PMH significant for RUE torn rotator cuff, arthritis in hands, weakness in ankle; patient is currently using [facility] for gait  safety; He has been using cane since Jan 2019. Patient reports that he is using cane as a precaution to help with steadiness;     How long can you sit comfortably?  NA    How long can you stand comfortably?  limited due to ankle issues (ankle replacement surgery x3); does wear compression hose on RLE    How long can you walk comfortably?  about a mile;     Diagnostic tests  MRI on 10/05/17 shows acute infarction affects the LEFT paramedian lower pons.     Patient Stated Goals  get back into the Stability class at [facility];     Currently in Pain?  No/denies    Multiple Pain Sites  No        TREATMENT:  Warm up on crosstrainer, BUE/BLE level 0 x4 min (Unbilled); seat #8  Patient instructed in advanced LE strengthening exercise as part of HEP: Standing with green tband around BLE: Hip abduction x10 bilaterally; Hip flexion x10 reps bilaterally; Hip extension x10 reps bilaterally Side stepping x10 feet x2 laps each direction Patient required min-moderate verbal/tactile cues for correct exercise technique including cues to avoid trunk lean for better strengthening and motor control;   Instructed patient in balance exercise: SLS on firm surface with  1-0 rail assist x5 sec hold x2 reps each LE: (R: 2 sec, L: 10 sec) patient required cues to improve upper trunk control and using rail assist as needed for balance; Tandem stance with 1-0 rail assist x10 sec hold x2 reps each foot in front; Patient requires cues to improve upper trunk control and weight shift for better balance especially with narrow base of support;  Forward/backward walking unsupported, beside counter x10 feet x3 laps each direction with cues to improve step length especially during backwards walking for better dynamic balance;  Standing on airex beam: Tandem stance unsupported with head turns side/side, up/down x2 head turns, each foot in front x2 sets each foot in front with min A for safety and cues to improve weight shift  for better stance control;  Side stepping unsupported x2 laps each direction with cues to improve hip/knee strategies for better dynamic balance control;  Standing on 1/2 bolster (flat side up): Heel/toe raise x15 with rail assist for balance; Feet apart, BUE wand flexion x10 reps with feet in neutral, requiring min A for balance and cues to improve ankle/hip strategies for better stance control;   Exercise: Leg press 90 lbs x 15 x 2 single leg , cues to control the speed and not snap the knee                 PT Education - 11/04/17 1256    Education provided  Yes    Education Details  HEP advanced, balance, strengthening;     Person(s) Educated  Patient    Methods  Explanation;Demonstration;Verbal cues    Comprehension  Verbalized understanding;Returned demonstration;Verbal cues required;Need further instruction       PT Short Term Goals - 10/22/17 1152      PT SHORT TERM GOAL #1   Title  Patient will be adherent to HEP at least 3x a week to improve functional strength and balance for better safety at home.    Time  4    Period  Weeks    Status  New    Target Date  11/19/17      PT SHORT TERM GOAL #2   Title  Patient will improve RLE gross strength to 4+/5 to improve functional strength with gait and standing tasks for less fatigue with ADLs;     Time  4    Period  Weeks    Status  New    Target Date  11/19/17        PT Long Term Goals - 10/22/17 1153      PT LONG TERM GOAL #1   Title  Patient will be independent in home exercise program to improve strength/mobility for better functional independence with ADLs.    Time  6    Period  Weeks    Status  New    Target Date  12/03/17      PT LONG TERM GOAL #2   Title  Patient will tolerate 5 seconds of single leg stance without loss of balance to improve ability to get in and out of shower safely.    Time  6    Period  Weeks    Status  New    Target Date  12/03/17      PT LONG TERM GOAL #3   Title   Patient will increase Functional Gait Assessment score to >20/30 as to reduce fall risk and improve dynamic gait safety with community ambulation.    Time  6  Period  Weeks    Status  New    Target Date  12/03/17            Plan - 11/04/17 1332    Clinical Impression Statement  Patient instructed in advanced HEP including LE strengthening and balance exercise. He reuqired cues for better positioning and LE control. Patient tolerated well without increase in pain; Patient does report increased fatigue with prolonged standing. He responds well with short sitting rest breaks. BP after exercise 167/93 indicating slight increase but not unsafe for exercise.  Patient would benefit from additional skilled PT intervention to improve balance, strength and overall mobility;     Rehab Potential  Good    Clinical Impairments Affecting Rehab Potential  motivated, has good family support; negative: recent ankle surgery- cleared by orthopedic;     PT Frequency  2x / week    PT Duration  6 weeks    PT Treatment/Interventions  Cryotherapy;Electrical Stimulation;Moist Heat;Therapeutic exercise;Therapeutic activities;Functional mobility training;Stair training;Gait training;DME Instruction;Balance training;Neuromuscular re-education;Patient/family education;Manual techniques;Taping;Energy conservation;Dry needling;Passive range of motion    PT Next Visit Plan  watch BP with exercise;     PT Home Exercise Plan  continue as given;     Consulted and Agree with Plan of Care  Patient       Patient will benefit from skilled therapeutic intervention in order to improve the following deficits and impairments:  Decreased endurance, Cardiopulmonary status limiting activity, Decreased activity tolerance, Decreased strength, Decreased balance, Decreased mobility  Visit Diagnosis: Muscle weakness (generalized)  Other lack of coordination  Unsteadiness on feet     Problem List Patient Active Problem List    Diagnosis Date Noted  . CVA (cerebrovascular accident) ([place]) 10/05/2017  . Accelerated hypertension 10/05/2017  . 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 Upper extremity weakness 10/05/2017    [name],[name] PT, DPT 11/04/2017, 1:47 PM  [place] MAIN [facility] SERVICES [address] [place], [place], [address] Phone: [phone]   Fax:  [phone]  Name: David Jennings MRN: [id number] Date of Birth: [birth year]

## 2017-11-04 NOTE — Therapy (Signed)
[place] MAIN [facility] SERVICES [address] [place], [place], [address] Phone: [phone]   Fax:  [phone]  Occupational Therapy Treatment  Patient Details  Name: David Jennings MRN: [id number] Date of Birth: 10/12/1940 Referring Provider: Cordelia Poche MD/babaoff, PCP   Encounter Date: 11/04/2017  OT End of Session - 11/04/17 1639    Visit Number  6    Number of Visits  24    Date for OT Re-Evaluation  01/08/18    OT Start Time  1345    OT Stop Time  1430    OT Time Calculation (min)  45 min    Activity Tolerance  Patient tolerated treatment well    Behavior During Therapy  [facility] for tasks assessed/performed       Past Medical History:  Diagnosis Date  . Asthma   . Hypertension     Past Surgical History:  Procedure Laterality Date  . ANKLE SURGERY Right   . BACK SURGERY    . REPLACEMENT TOTAL KNEE BILATERAL    . TONSILLECTOMY      There were no vitals filed for this visit.  Subjective Assessment - 11/04/17 1624    Subjective   Pt continues to be motivated and do exercises at home and use both UEs for ADLs.    Pertinent History  Pt. is a 77 y.o. 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 Pt. was was admitted to [facility] where it was determined that he had an acute infarct in the left peremedian lower pons. Pt. presented with RUE wekness, incoordination, and Diplopia  at onset. Diplopia has resolved. pt. Pt. has a history of a right rotator cuff injury a year ago, and needs to have a reverse totall shoulder replacement, however has been recovering from ankle surgery this past year.    Patient Stated Goals  To get back to doing the things that he used to do and understand what problems I still have are from the stroke.    Currently in Pain?  No/denies    Multiple Pain Sites  No                   OT Treatments/Exercises (OP) - 11/04/17 1628      ADLs   Eating  Pt is able use his R hand to use spoon for eating  cereal in the morning without assist.    Grooming  Pt is able to flex R shoulder up to head but has decreased pronation and wrist flex/extension to control brush for brushing his hair.  No pain noted as he brought RUE up to head despite hx of rotator cuff tea    LB Dressing  Pt able to tie shoes using R hand with L hand while sitting in chair. Rec using bread bag or plastic bag for donning ted hose and demonstrated how to complete with teach back.      Cognitive Exercises   Handwriting  t able to use R hand to write name legibly using pen with both printing and cursive.      Neurological Re-education Exercises   Other Exercises 1  Pt able to use needle nose pliers to pick up and manipulate small beads but needed mod cues to decrease shoulder flexion and abduction to substitute for decreased strength.  Pt was also leaning to left without realizing it.  Rec pt peform next session in front of mirror for visual feedback.  Pt needed help to focus on task  and be redirected since he was very focused on whether or not this was related to the stroke he had.Pt able to use 2 point pinch to stack 5 small beads on top of each other with improved control this session. Rec pt practice threading a needle and fishing line to continue to work on isolated muscles and control.             OT Education - 11/04/17 1639    Education provided  Yes    Education Details  HEP for more fine motor exercises    Person(s) Educated  Patient    Methods  Explanation;Demonstration;Verbal cues    Comprehension  Verbalized understanding;Returned demonstration;Verbal cues required;Need further instruction          OT Long Term Goals - 11/04/17 1644      OT LONG TERM GOAL #1   Title  Pt. will improve [facility] skills to be able be able to independently grasp and move objects through his hand efficiently during ADL tasks 100% of the time.    Baseline  Pt. has difficulty with translatory movements of the hand.    Time  12     Status  On-going      OT LONG TERM GOAL #2   Title  Pt. will improve right hand motor control to be able to brush his teeth independently, and efficiently.    Baseline  Pt. has incoordination with brushing teeth.    Time  12    Period  Weeks    Status  On-going    Target Date  01/08/18      OT LONG TERM GOAL #3   Title  Pt. will write 4 sentences legibily, and efficiently in preparation for IADL paperwork tasks.    Baseline  Pt. has difficulty with stamina for writing more than his name and address.    Time  12    Period  Weeks    Status  On-going      OT LONG TERM GOAL #4   Title  Pt. will demonstrate good motor control, and coordination skills to be able to efficiently, and independently perform fluid hand to mouth patterns during self-feeding.    Baseline  Pt. has difficulty with more complex movement patterns but can feed self cereal and cut meat with R hand.    Time  12    Status  Partially Met    Target Date  01/08/18      OT LONG TERM GOAL #5   Title  Pt. will demonstrate brushing hair with Modified independence    Baseline  limited    Time  12    Period  Weeks            Plan - 11/04/17 1640    Clinical Impression Statement  Pt continues to be motivated at home and working on various exercises with washers and screws at home.  He is able to carry a cup of coffee now while ambulating and tie his shoes independently.  He continues to make progress with intrinsic muscle control with R hand to improve functional use for ADLS and IADLS.  Rec pt complete exercises in front of a mirror to have visual feedback about keeping body in neutral and not lean to R or L during tasks.    Occupational performance deficits (Please refer to evaluation for details):  ADL's;IADL's    OT Treatment/Interventions  Self-care/ADL training;Therapeutic exercise;Neuromuscular education;Patient/family education;Energy conservation;DME and/or AE instruction;Therapeutic activities    Consulted and  Agree with Plan of Care  Patient       Patient will benefit from skilled therapeutic intervention in order to improve the following deficits and impairments:     Visit Diagnosis: Muscle weakness (generalized)  Other lack of coordination    Problem List Patient Active Problem List   Diagnosis Date Noted  . CVA (cerebrovascular accident) ([place]) 10/05/2017  . Accelerated hypertension 10/05/2017  . 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 Upper extremity weakness 10/05/2017    Wofford,Susan 11/04/2017, 4:50 PM  [place] MAIN [facility] SERVICES [address] [place], [place], [address] Phone: [phone]   Fax:  [phone]  Name: David Jennings MRN: [id number] Date of Birth: May 01, 1941

## 2017-11-07 ENCOUNTER — Ambulatory Visit: Payer: Medicare PPO | Admitting: Physical Therapy

## 2017-11-07 ENCOUNTER — Encounter: Payer: Self-pay | Admitting: Occupational Therapy

## 2017-11-07 ENCOUNTER — Encounter: Payer: Self-pay | Admitting: Physical Therapy

## 2017-11-07 ENCOUNTER — Ambulatory Visit: Payer: Medicare PPO | Admitting: Occupational Therapy

## 2017-11-07 DIAGNOSIS — R278 Other lack of coordination: Secondary | ICD-10-CM

## 2017-11-07 DIAGNOSIS — M6281 Muscle weakness (generalized): Secondary | ICD-10-CM

## 2017-11-07 DIAGNOSIS — R2681 Unsteadiness on feet: Secondary | ICD-10-CM

## 2017-11-07 NOTE — Therapy (Signed)
[place] MAIN [facility] SERVICES [address] [place], [place], [address] Phone: [phone]   Fax:  [phone]  Physical Therapy Treatment  Patient Details  Name: David Jennings MRN: [id number] Date of Birth: 1941-07-05 Referring Provider: Cordelia Poche MD/babaoff, PCP   Encounter Date: 11/07/2017  PT End of Session - 11/07/17 0918    Visit Number  6    Number of Visits  13    Date for PT Re-Evaluation  12/03/17    Authorization Type  $40 copay each visit;     PT Start Time  0920    PT Stop Time  1005    PT Time Calculation (min)  45 min    Equipment Utilized During Treatment  Gait belt    Activity Tolerance  Patient tolerated treatment well    Behavior During Therapy  [facility] for tasks assessed/performed       Past Medical History:  Diagnosis Date  . Asthma   . Hypertension     Past Surgical History:  Procedure Laterality Date  . ANKLE SURGERY Right   . BACK SURGERY    . REPLACEMENT TOTAL KNEE BILATERAL    . TONSILLECTOMY      There were no vitals filed for this visit.  Subjective Assessment - 11/07/17 0927    Subjective  Patient reports doing well; denies any pain today; reports a little stiffness with it being early in the morning; Presents to therapy with SPC but is able to walk some without it.     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 Patient has PMH significant for right ankle replacement with spacer. Patient was released from [place] on 10/02/17 and was told that they were going to leave the spacer. When he got home he reports, "I felt weird. I started having double vision." Patient reports increased numbness in right hand; Patient called Primary MD who referred him to ED for possible CVA work-up; MRI showed acute infarct. He went for a follow up and also got a workup for A-fib which was negative. Patient is supposed to follow up with PCP in 6 months; PMH significant for RUE torn rotator cuff, arthritis in hands,  weakness in ankle; patient is currently using [facility] for gait safety; He has been using cane since Jan 2019. Patient reports that he is using cane as a precaution to help with steadiness;     How long can you sit comfortably?  NA    How long can you stand comfortably?  limited due to ankle issues (ankle replacement surgery x3); does wear compression hose on RLE    How long can you walk comfortably?  about a mile;     Diagnostic tests  MRI on 10/05/17 shows acute infarction affects the LEFT paramedian lower pons.     Patient Stated Goals  get back into the Stability class at [facility];     Currently in Pain?  No/denies    Multiple Pain Sites  No          TREATMENT:  Warm up on crosstrainer, BUE/BLE level 2 x4 min (Unbilled); seat #8  Resisted walking: 12.5# forward/backward, side/side (2 way) x2 laps each with min A for safety; required cues to slow down eccentric return for better dynamic balance;   Standing on airex: Alternate toe taps on 5 inch step unsupported x15 bilaterally with min A for safety; Standing one foot on airex, one foot on 5 inch step, BUE ball pass side/side  x5 reps each direction, unsupported; Modified tandem stance: Ball toss up/down x10 reps with min A to keep balance and cues for better weight shift with narrow base of support to improve stance control;   Standing on 1/2 bolster (flat side up): Heel/toe raise x15 with rail assist for balance; Feet apart, BUE wand flexion x15 reps with feet in neutral, requiring min A for balance and cues to improve ankle/hip strategies for better stance control;  Tandem stance on upside down 1/2 bolster with 2-0 rail assist x10 sec hold x2 reps each foot in front with min A for safety and cues to improve erect posture for better stance control;    Exercise: Leg press 105lbs x 10 x 2 single leg , cues to control the speed and not snap the knee Leg press, Single leg heel raise 105#, 2x10 bilaterally with min VCs to improve knee  position for better ankle activation;  Sitting: Green tband around BLE: Hip flexion march x15 bilaterally with cues to increase ROM for better hip strengthening; Ankle DF x20 bilaterally with cues to keep feet apart for better resistance on band for better ankle strengthening;    Standing: Heel off step calf stretch 20 sec hold x2 reps with min Vcs for better positioning to improve stretch; Hamstring stretch with ankle DF on step 20 sec hold x2 reps bilaterally with min Vcs for positioning to improve stretch;    Gait on treadmill 1.0-1.3 mph with 2 HHA x5 min with cues to improve erect posture, increase step length and improve ankle DF at heel strike for better foot clearance; Patient tolerated well, reports minimal fatigue; BP after gait: 146/87, indicating better cardiovascular endurance;                  PT Education - 11/07/17 0918    Education provided  Yes    Education Details  balance, strengthening, HEP reinforced;     Person(s) Educated  Patient    Methods  Explanation;Demonstration;Verbal cues    Comprehension  Verbalized understanding;Returned demonstration;Verbal cues required;Need further instruction       PT Short Term Goals - 10/22/17 1152      PT SHORT TERM GOAL #1   Title  Patient will be adherent to HEP at least 3x a week to improve functional strength and balance for better safety at home.    Time  4    Period  Weeks    Status  New    Target Date  11/19/17      PT SHORT TERM GOAL #2   Title  Patient will improve RLE gross strength to 4+/5 to improve functional strength with gait and standing tasks for less fatigue with ADLs;     Time  4    Period  Weeks    Status  New    Target Date  11/19/17        PT Long Term Goals - 10/22/17 1153      PT LONG TERM GOAL #1   Title  Patient will be independent in home exercise program to improve strength/mobility for better functional independence with ADLs.    Time  6    Period  Weeks    Status   New    Target Date  12/03/17      PT LONG TERM GOAL #2   Title  Patient will tolerate 5 seconds of single leg stance without loss of balance to improve ability to get in and out of shower safely.  Time  6    Period  Weeks    Status  New    Target Date  12/03/17      PT LONG TERM GOAL #3   Title  Patient will increase Functional Gait Assessment score to >20/30 as to reduce fall risk and improve dynamic gait safety with community ambulation.    Time  6    Period  Weeks    Status  New    Target Date  12/03/17            Plan - 11/07/17 1004    Clinical Impression Statement  Patient instructed in advanced LE strengthening and balance exercise. patient required cues for better posutral control and weight shift for better stance control. He is progressing well. Patient exhibits better cardiovascular endurance with less rise in BP with advanced activity. Instructed patinet in increased strengthening with increased resistance for better progression. he would benefit from additional skilled PT intervention to improve strength, balance and gait safety; Continue to address ankle weakness especially on RLE;     Rehab Potential  Good    Clinical Impairments Affecting Rehab Potential  motivated, has good family support; negative: recent ankle surgery- cleared by orthopedic;     PT Frequency  2x / week    PT Duration  6 weeks    PT Treatment/Interventions  Cryotherapy;Electrical Stimulation;Moist Heat;Therapeutic exercise;Therapeutic activities;Functional mobility training;Stair training;Gait training;DME Instruction;Balance training;Neuromuscular re-education;Patient/family education;Manual techniques;Taping;Energy conservation;Dry needling;Passive range of motion    PT Next Visit Plan  watch BP with exercise;     PT Home Exercise Plan  continue as given;     Consulted and Agree with Plan of Care  Patient       Patient will benefit from skilled therapeutic intervention in order to improve  the following deficits and impairments:  Decreased endurance, Cardiopulmonary status limiting activity, Decreased activity tolerance, Decreased strength, Decreased balance, Decreased mobility  Visit Diagnosis: Muscle weakness (generalized)  Other lack of coordination  Unsteadiness on feet     Problem List Patient Active Problem List   Diagnosis Date Noted  . CVA (cerebrovascular accident) ([place]) 10/05/2017  . Accelerated hypertension 10/05/2017  . 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 Upper extremity weakness 10/05/2017    Andre Gallego PT, DPT 11/07/2017, 10:20 AM  [place] MAIN [facility] SERVICES [address] [place], [place], [address] Phone: [phone]   Fax:  [phone]  Name: David Jennings MRN: [id number] Date of Birth: Aug 26, 1941

## 2017-11-07 NOTE — Therapy (Signed)
[place] MAIN [facility] SERVICES [address] [place], [place], [address] Phone: [phone]   Fax:  [phone]  Occupational Therapy Treatment  Patient Details  Name: David Jennings MRN: [id number] Date of Birth: 06/06/1941 Referring Provider: Cordelia Poche MD/babaoff, PCP   Encounter Date: 11/07/2017  OT End of Session - 11/07/17 1036    Visit Number  7    Number of Visits  24    Date for OT Re-Evaluation  01/08/18    OT Start Time  1017    OT Stop Time  1100    OT Time Calculation (min)  43 min    Activity Tolerance  Patient tolerated treatment well    Behavior During Therapy  [facility] for tasks assessed/performed       Past Medical History:  Diagnosis Date  . Asthma   . Hypertension     Past Surgical History:  Procedure Laterality Date  . ANKLE SURGERY Right   . BACK SURGERY    . REPLACEMENT TOTAL KNEE BILATERAL    . TONSILLECTOMY      There were no vitals filed for this visit.  Subjective Assessment - 11/07/17 1034    Subjective   Pt. reports he is doing the putty HEP at home.    Pertinent History  Pt. is a 77 y.o. 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 Pt. was was admitted to [facility] where it was determined that he had an acute infarct in the left peremedian lower pons. Pt. presented with RUE wekness, incoordination, and Diplopia  at onset. Diplopia has resolved. pt. Pt. has a history of a right rotator cuff injury a year ago, and needs to have a reverse totall shoulder replacement, however has been recovering from ankle surgery this past year.    Patient Stated Goals  To get back to doing the things that he used to do and understand what problems I still have are from the stroke.    Currently in Pain?  No/denies    Multiple Pain Sites  No       OT TREATMENT    Neuro muscular re-education:  Pt. performed [facility] skills training to improve speed and dexterity needed for ADL tasks and writing. Pt. demonstrated grasping 1 inch  sticks,  inch cylindrical collars, and  inch flat washers on the Purdue pegboard. Pt. performed grasping each item with her 2nd digit and thumb, and storing them in the palm. Pt. presented with difficulty storing  inch objects at a time in the palmar aspect of the hand. Pt. Worked on grasping and manipulating nuts, and bolts while RUE is elevated.  Pt worked on translatory movements working on storing objects in the ulnar aspect of his right hand.  Therapeutic Exercise:  Pt. Worked on pinch strengthening in the left hand for lateral, and 3pt. pinch using yellow, red, green, and blue resistive clips. Pt. worked on placing the clips at a vertical and horizontal angles. Tactile and verbal cues were required for eliciting the desired movement. Pt. Requires cues for compensation proximally.                          OT Education - 11/07/17 1035    Education provided  Yes    Education Details  HEP, [facility]    Person(s) Educated  Patient    Methods  Explanation;Demonstration;Verbal cues    Comprehension  Verbalized understanding;Returned demonstration;Verbal cues required;Need further instruction  OT Long Term Goals - 11/04/17 1644      OT LONG TERM GOAL #1   Title  Pt. will improve [facility] skills to be able be able to independently grasp and move objects through his hand efficiently during ADL tasks 100% of the time.    Baseline  Pt. has difficulty with translatory movements of the hand.    Time  12    Status  On-going      OT LONG TERM GOAL #2   Title  Pt. will improve right hand motor control to be able to brush his teeth independently, and efficiently.    Baseline  Pt. has incoordination with brushing teeth.    Time  12    Period  Weeks    Status  On-going    Target Date  01/08/18      OT LONG TERM GOAL #3   Title  Pt. will write 4 sentences legibily, and efficiently in preparation for IADL paperwork tasks.    Baseline  Pt. has difficulty with stamina for  writing more than his name and address.    Time  12    Period  Weeks    Status  On-going      OT LONG TERM GOAL #4   Title  Pt. will demonstrate good motor control, and coordination skills to be able to efficiently, and independently perform fluid hand to mouth patterns during self-feeding.    Baseline  Pt. has difficulty with more complex movement patterns but can feed self cereal and cut meat with R hand.    Time  12    Status  Partially Met    Target Date  01/08/18      OT LONG TERM GOAL #5   Title  Pt. will demonstrate brushing hair with Modified independence    Baseline  limited    Time  12    Period  Weeks            Plan - 11/07/17 1036    Clinical Impression Statement  Pt. continues to work with his right hand at home. Pt. continues to work on improving [facility] skills. Pt. continues require work on translatory movements of the hand, and using the ulnar aspect of his hand for storage.     Occupational performance deficits (Please refer to evaluation for details):  ADL's;IADL's    OT Frequency  2x / week    OT Duration  12 weeks    OT Treatment/Interventions  Self-care/ADL training;Therapeutic exercise;Neuromuscular education;Patient/family education;Energy conservation;DME and/or AE instruction;Therapeutic activities    Consulted and Agree with Plan of Care  Patient       Patient will benefit from skilled therapeutic intervention in order to improve the following deficits and impairments:  Decreased strength, Decreased balance, Decreased endurance, Decreased activity tolerance, Impaired UE functional use, Decreased coordination  Visit Diagnosis: Other lack of coordination  Muscle weakness (generalized)    Problem List Patient Active Problem List   Diagnosis Date Noted  . CVA (cerebrovascular accident) ([place]) 10/05/2017  . Accelerated hypertension 10/05/2017  . 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 Upper extremity weakness 10/05/2017    [name] 11/07/2017,  10:54 AM  [place] MAIN [facility] SERVICES [address] [place], [place], [address] Phone: [phone]   Fax:  [phone]  Name: David Jennings MRN: [id number] Date of Birth: 04-14-1941

## 2017-11-12 ENCOUNTER — Encounter: Payer: Self-pay | Admitting: Physical Therapy

## 2017-11-12 ENCOUNTER — Encounter: Payer: Medicare PPO | Admitting: Occupational Therapy

## 2017-11-12 ENCOUNTER — Ambulatory Visit: Payer: Medicare PPO | Admitting: Physical Therapy

## 2017-11-12 NOTE — Therapy (Deleted)
[place] MAIN [facility] SERVICES [address] [place], [place], [address] Phone: [phone]   Fax:  [phone]  Physical Therapy Treatment  Patient Details  Name: David Jennings MRN: [id number] Date of Birth: 07-Feb-1941 Referring Provider: Cordelia Poche MD/babaoff, PCP   Encounter Date: 11/12/2017    Past Medical History:  Diagnosis Date  . Asthma   . Hypertension     Past Surgical History:  Procedure Laterality Date  . ANKLE SURGERY Right   . BACK SURGERY    . REPLACEMENT TOTAL KNEE BILATERAL    . TONSILLECTOMY      There were no vitals filed for this visit.             TREATMENT:  Warm up on crosstrainer, BUE/BLE level 2 x4 min (Unbilled); seat #8  Resisted walking: 12.5# forward/backward, side/side (2 way) x2 laps each with min A for safety; required cues to slow down eccentric return for better dynamic balance;   Standing on airex: Alternate toe taps on 5 inch step unsupported x15 bilaterally with min A for safety; Standing one foot on airex, one foot on 5 inch step, BUE ball pass side/side x5 reps each direction, unsupported; Modified tandem stance: Ball toss up/down x10 reps with min A to keep balance and cues for better weight shift with narrow base of support to improve stance control;   Standing on 1/2 bolster (flat side up): Heel/toe raise x15 with rail assist for balance; Feet apart, BUE wand flexion x15 reps with feet in neutral, requiring min A for balance and cues to improve ankle/hip strategies for better stance control; Tandem stance on upside down 1/2 bolster with 2-0 rail assist x10 sec hold x2 reps each foot in front with min A for safety and cues to improve erect posture for better stance control;    Exercise: Leg press 105lbs x 10x 2 single leg , cues to control the speed and not snap the knee  Leg press, Single leg heel raise 105#, 2x10 bilaterally with min VCs to improve knee  position for better ankle activation;  Sitting: Green tband around BLE: Hip flexion march x15 bilaterally with cues to increase ROM for better hip strengthening; Ankle DF x20 bilaterally with cues to keep feet apart for better resistance on band for better ankle strengthening;    Standing: Heel off step calf stretch 20 sec hold x2 reps with min Vcs for better positioning to improve stretch; Hamstring stretch with ankle DF on step 20 sec hold x2 reps bilaterally with min Vcs for positioning to improve stretch;   Gait on treadmill 1.0-1.3 mph with 2 HHA x5 min with cues to improve erect posture, increase step length and improve ankle DF at heel strike for better foot clearance; Patient tolerated well, reports minimal fatigue; BP after gait: 146/87, indicating better cardiovascular endurance;                  PT Short Term Goals - 10/22/17 1152      PT SHORT TERM GOAL #1   Title  Patient will be adherent to HEP at least 3x a week to improve functional strength and balance for better safety at home.    Time  4    Period  Weeks    Status  New    Target Date  11/19/17      PT SHORT TERM GOAL #2   Title  Patient will improve RLE gross strength to 4+/5 to improve functional strength  with gait and standing tasks for less fatigue with ADLs;     Time  4    Period  Weeks    Status  New    Target Date  11/19/17        PT Long Term Goals - 10/22/17 1153      PT LONG TERM GOAL #1   Title  Patient will be independent in home exercise program to improve strength/mobility for better functional independence with ADLs.    Time  6    Period  Weeks    Status  New    Target Date  12/03/17      PT LONG TERM GOAL #2   Title  Patient will tolerate 5 seconds of single leg stance without loss of balance to improve ability to get in and out of shower safely.    Time  6    Period  Weeks    Status  New    Target Date  12/03/17      PT LONG TERM GOAL #3   Title  Patient will  increase Functional Gait Assessment score to >20/30 as to reduce fall risk and improve dynamic gait safety with community ambulation.    Time  6    Period  Weeks    Status  New    Target Date  12/03/17              Patient will benefit from skilled therapeutic intervention in order to improve the following deficits and impairments:     Visit Diagnosis: No diagnosis found.     Problem List Patient Active Problem List   Diagnosis Date Noted  . CVA (cerebrovascular accident) ([place]) 10/05/2017  . Accelerated hypertension 10/05/2017  . 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 Upper extremity weakness 10/05/2017    Jennings,David 11/12/2017, 10:08 AM  [place] MAIN [facility] SERVICES [address] [place], [place], [address] Phone: [phone]   Fax:  [phone]  Name: David Jennings MRN: [id number] Date of Birth: 1940-11-30

## 2017-11-13 ENCOUNTER — Encounter: Payer: Self-pay | Admitting: Occupational Therapy

## 2017-11-13 ENCOUNTER — Encounter: Payer: Self-pay | Admitting: Physical Therapy

## 2017-11-13 ENCOUNTER — Ambulatory Visit: Payer: Medicare PPO | Admitting: Occupational Therapy

## 2017-11-13 ENCOUNTER — Ambulatory Visit: Payer: Medicare PPO | Admitting: Physical Therapy

## 2017-11-13 DIAGNOSIS — R278 Other lack of coordination: Secondary | ICD-10-CM

## 2017-11-13 DIAGNOSIS — M6281 Muscle weakness (generalized): Secondary | ICD-10-CM

## 2017-11-13 DIAGNOSIS — R2681 Unsteadiness on feet: Secondary | ICD-10-CM

## 2017-11-13 NOTE — Therapy (Signed)
[place] MAIN [facility] SERVICES [address] [place], [place], [address] Phone: [phone]   Fax:  [phone]  Physical Therapy Treatment/Progress Note  Patient Details  Name: David Jennings MRN: [id number] Date of Birth: 1940/12/29 Referring Provider: Cordelia Poche MD/babaoff, PCP   Encounter Date: 11/13/2017  PT End of Session - 11/13/17 0938    Visit Number  7    Number of Visits  13    Date for PT Re-Evaluation  12/03/17    Authorization Type  $40 copay each visit;     PT Start Time  0935    PT Stop Time  1015    PT Time Calculation (min)  40 min    Equipment Utilized During Treatment  Gait belt    Activity Tolerance  Patient tolerated treatment well    Behavior During Therapy  [facility] for tasks assessed/performed       Past Medical History:  Diagnosis Date  . Asthma   . Hypertension     Past Surgical History:  Procedure Laterality Date  . ANKLE SURGERY Right   . BACK SURGERY    . REPLACEMENT TOTAL KNEE BILATERAL    . TONSILLECTOMY      There were no vitals filed for this visit.  Subjective Assessment - 11/13/17 0937    Subjective  Patient reports increased stiffness today; denies any pain; Reports doing well; He reports that his walking has been doing better for the most part. He does report sometimes RLE ankle can be sore, but that is intermittent; He reports, "I dont' think that I veer as much as I did before." He is able to do some walking without the cane;     Pertinent History  77 yo male s/p CVA on 10/02/17. Patient has PMH significant for right ankle replacement with spacer. Patient was released from [place] on 10/02/17 and was told that they were going to leave the spacer. When he got home he reports, "I felt weird. I started having double vision." Patient reports increased numbness in right hand; Patient called Primary MD who referred him to ED for possible CVA work-up; MRI showed acute infarct. He went for a follow up and  also got a workup for A-fib which was negative. Patient is supposed to follow up with PCP in 6 months; PMH significant for RUE torn rotator cuff, arthritis in hands, weakness in ankle; patient is currently using [facility] for gait safety; He has been using cane since Jan 2019. Patient reports that he is using cane as a precaution to help with steadiness;     How long can you sit comfortably?  NA    How long can you stand comfortably?  limited due to ankle issues (ankle replacement surgery x3); does wear compression hose on RLE    How long can you walk comfortably?  about a mile;     Diagnostic tests  MRI on 10/05/17 shows acute infarction affects the LEFT paramedian lower pons.     Patient Stated Goals  get back into the Stability class at [facility];     Currently in Pain?  No/denies    Multiple Pain Sites  No           [facility] PT Assessment - 11/13/17 0001      Strength   Overall Strength Comments  Hip flexion:R: 4/5, L: 4+/5, abduction/adduction 4+/5, knee 5/5 bilaterally, ankle: R: 4/5, L: 5/5      High Level Balance   High Level  Balance Comments  LLE: SLS >5 sec, R: unable to hold >1 sec;       Functional Gait  Assessment   Gait Level Surface  Walks 20 ft in less than 5.5 sec, no assistive devices, good speed, no evidence for imbalance, normal gait pattern, deviates no more than 6 in outside of the 12 in walkway width.    Change in Gait Speed  Able to smoothly change walking speed without loss of balance or gait deviation. Deviate no more than 6 in outside of the 12 in walkway width.    Gait with Horizontal Head Turns  Performs head turns smoothly with slight change in gait velocity (eg, minor disruption to smooth gait path), deviates 6-10 in outside 12 in walkway width, or uses an assistive device.    Gait with Vertical Head Turns  Performs head turns with no change in gait. Deviates no more than 6 in outside 12 in walkway width.    Gait and Pivot Turn  Pivot turns safely within 3 sec and stops  quickly with no loss of balance.    Step Over Obstacle  Is able to step over 2 stacked shoe boxes taped together (9 in total height) without changing gait speed. No evidence of imbalance.    Gait with Narrow Base of Support  Ambulates 4-7 steps.    Gait with Eyes Closed  Walks 20 ft, no assistive devices, good speed, no evidence of imbalance, normal gait pattern, deviates no more than 6 in outside 12 in walkway width. Ambulates 20 ft in less than 7 sec.    Ambulating Backwards  Walks 20 ft, no assistive devices, good speed, no evidence for imbalance, normal gait    Steps  Alternating feet, must use rail.    Total Score  26    FGA comment:  >24 indicates low fall risk       TREATMENT:  Warm up on Nustep, BUE/BLE level 2 x4 min (Unbilled); Instructed patient in Functional gait assessment, assessed strength and balance, see above;    Instructed patient in RLE ankle strengthening: Ankle DF, PF, IV, EV 2x15 red tband each direction with mod Vcs to improve ankle ROM and reduce hip/knee motion for better ankle strengthening;  RLE BAPs board, level 3, ankle DF/PF x15 rep, ankle IV/EV x15 reps with patient having significant difficulty with ankle EV due to weakness and poor ROM in RLE ankle; Patient required several verbal and tactile cues to isolate ankle ROM while on BAPs board. He denies any increase in pain in RLE ankle with advanced ankle exercise;  Assessed SLS on RLE following ankle exercise. He was able to hold for 2 sec but is still unsteady; Instructed patient to continue with HEP to address SLS ability and strengthening;                  PT Education - 11/13/17 0938    Education provided  Yes    Education Details  progress towards goals, gait safety, balance;     Person(s) Educated  Patient    Methods  Explanation;Demonstration;Verbal cues    Comprehension  Verbalized understanding;Returned demonstration;Verbal cues required;Need further instruction       PT Short  Term Goals - 11/13/17 0939      PT SHORT TERM GOAL #1   Title  Patient will be adherent to HEP at least 3x a week to improve functional strength and balance for better safety at home.    Time  4  Period  Weeks    Status  Achieved      PT SHORT TERM GOAL #2   Title  Patient will improve RLE gross strength to 4+/5 to improve functional strength with gait and standing tasks for less fatigue with ADLs;     Time  4    Period  Weeks    Status  Partially Met    Target Date  11/19/17        PT Long Term Goals - 11/13/17 0940      PT LONG TERM GOAL #1   Title  Patient will be independent in home exercise program to improve strength/mobility for better functional independence with ADLs.    Time  6    Period  Weeks    Status  On-going    Target Date  12/03/17      PT LONG TERM GOAL #2   Title  Patient will tolerate 5 seconds of single leg stance without loss of balance to improve ability to get in and out of shower safely.    Time  6    Period  Weeks    Status  Partially Met    Target Date  12/03/17      PT LONG TERM GOAL #3   Title  Patient will increase Functional Gait Assessment score to >20/30 as to reduce fall risk and improve dynamic gait safety with community ambulation.    Time  6    Period  Weeks    Status  Achieved    Target Date  12/03/17            Plan - 11/13/17 1032    Clinical Impression Statement  patient instructed in outcome measures to address progress towards goals. He does exhibit improved RLE hip strengthening but still has some weakness in right leg. patient also exhibits increased weakness in RLE ankle. patient instructed in dynamic balance assessment. He tested as a low risk for falls. He is having difficulty achieving SLS on RLE due to poor ankle control and decreased foot contact. Patient would benefit from additional skilled PT intervention to improve LE strength, balance and gait safety;     Rehab Potential  Good    Clinical Impairments  Affecting Rehab Potential  motivated, has good family support; negative: recent ankle surgery- cleared by orthopedic;     PT Frequency  2x / week    PT Duration  6 weeks    PT Treatment/Interventions  Cryotherapy;Electrical Stimulation;Moist Heat;Therapeutic exercise;Therapeutic activities;Functional mobility training;Stair training;Gait training;DME Instruction;Balance training;Neuromuscular re-education;Patient/family education;Manual techniques;Taping;Energy conservation;Dry needling;Passive range of motion    PT Next Visit Plan  watch BP with exercise;     PT Home Exercise Plan  continue as given;     Consulted and Agree with Plan of Care  Patient       Patient will benefit from skilled therapeutic intervention in order to improve the following deficits and impairments:  Decreased endurance, Cardiopulmonary status limiting activity, Decreased activity tolerance, Decreased strength, Decreased balance, Decreased mobility  Visit Diagnosis: Muscle weakness (generalized)  Other lack of coordination  Unsteadiness on feet     Problem List Patient Active Problem List   Diagnosis Date Noted  . CVA (cerebrovascular accident) ([place]) 10/05/2017  . Accelerated hypertension 10/05/2017  . 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 Upper extremity weakness 10/05/2017    Beza Steppe PT, DPT 11/13/2017, 10:34 AM  [place] MAIN [facility] SERVICES [place], [place], [address] Phone: [phone]  Fax:  [phone]  Name: YIDEL TEUSCHER MRN: [id number] Date of Birth: [birth year]

## 2017-11-13 NOTE — Therapy (Signed)
[place] MAIN [facility] SERVICES [address] [place], [place], [address] Phone: [phone]   Fax:  [phone]  Occupational Therapy Treatment  Patient Details  Name: David Jennings MRN: [id number] Date of Birth: 1941/09/22 Referring Provider: Cordelia Poche MD/babaoff, PCP   Encounter Date: 11/13/2017  OT End of Session - 11/13/17 0921    Visit Number  8    Number of Visits  24    Date for OT Re-Evaluation  01/08/18    OT Start Time  0845    OT Stop Time  0930    OT Time Calculation (min)  45 min    Activity Tolerance  Patient tolerated treatment well    Behavior During Therapy  [facility] for tasks assessed/performed       Past Medical History:  Diagnosis Date  . Asthma   . Hypertension     Past Surgical History:  Procedure Laterality Date  . ANKLE SURGERY Right   . BACK SURGERY    . REPLACEMENT TOTAL KNEE BILATERAL    . TONSILLECTOMY      There were no vitals filed for this visit.  Subjective Assessment - 11/13/17 0920    Subjective   Pt. reports he is going to see a ply this weekend    Pertinent History  Pt. is a 77 y.o. 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 Pt. was was admitted to [facility] where it was determined that he had an acute infarct in the left peremedian lower pons. Pt. presented with RUE wekness, incoordination, and Diplopia  at onset. Diplopia has resolved. pt. Pt. has a history of a right rotator cuff injury a year ago, and needs to have a reverse totall shoulder replacement, however has been recovering from ankle surgery this past year.    Currently in Pain?  No/denies       OT TREATMENT    Neuro muscular re-education:  Pt. worked on tasks to sustain lateral pinch on resistive tweezers while grasping and moving 2" toothpick sticks from a horizontal flat position to a vertical position in order to place it in the holder. Pt. was able to sustain grasp while positioning and extending the wrist/hand in the  necessary alignment needed to place the stick through the top of the holder. Pt. performed [facility] tasks using the Grooved pegboard. Pt. worked on grasping the grooved pegs from a horizontal position, and moving the pegs to a vertical position in the hand to prepare for placing them in the grooved slot. Pt. Worked on removing them alternating thumb opposition to the tip of the 2nd through 5th digits.  Therapeutic Exercise:  Pt. Worked on pinch strengthening in the right hand for lateral, and 3pt. pinch using red, green, and blue resistive clips. Pt. worked on placing the clips at various vertical and horizontal angles. Tactile and verbal cues were required for eliciting the desired movement.                         OT Education - 11/13/17 0920    Education provided  Yes    Education Details  [facility]    Person(s) Educated  Patient    Methods  Explanation;Demonstration;Verbal cues    Comprehension  Verbalized understanding;Returned demonstration;Verbal cues required;Need further instruction          OT Long Term Goals - 11/04/17 1644      OT LONG TERM GOAL #1   Title  Pt. will improve  [facility] skills to be able be able to independently grasp and move objects through his hand efficiently during ADL tasks 100% of the time.    Baseline  Pt. has difficulty with translatory movements of the hand.    Time  12    Status  On-going      OT LONG TERM GOAL #2   Title  Pt. will improve right hand motor control to be able to brush his teeth independently, and efficiently.    Baseline  Pt. has incoordination with brushing teeth.    Time  12    Period  Weeks    Status  On-going    Target Date  01/08/18      OT LONG TERM GOAL #3   Title  Pt. will write 4 sentences legibily, and efficiently in preparation for IADL paperwork tasks.    Baseline  Pt. has difficulty with stamina for writing more than his name and address.    Time  12    Period  Weeks    Status  On-going      OT LONG TERM  GOAL #4   Title  Pt. will demonstrate good motor control, and coordination skills to be able to efficiently, and independently perform fluid hand to mouth patterns during self-feeding.    Baseline  Pt. has difficulty with more complex movement patterns but can feed self cereal and cut meat with R hand.    Time  12    Status  Partially Met    Target Date  01/08/18      OT LONG TERM GOAL #5   Title  Pt. will demonstrate brushing hair with Modified independence    Baseline  limited    Time  12    Period  Weeks            Plan - 11/13/17 3335    Clinical Impression Statement  Pt. is improving with pinch strengthening, and is able to maintain lateral pinch while extensding wrist when using tweezers. Pt. continues to work on improving right hand function skills, and translatory movements duirng [facility] tasks.     Occupational performance deficits (Please refer to evaluation for details):  ADL's;IADL's    OT Frequency  2x / week    OT Duration  12 weeks    OT Treatment/Interventions  Self-care/ADL training;Therapeutic exercise;Neuromuscular education;Patient/family education;Energy conservation;DME and/or AE instruction;Therapeutic activities    Consulted and Agree with Plan of Care  Patient       Patient will benefit from skilled therapeutic intervention in order to improve the following deficits and impairments:  Decreased strength, Decreased balance, Decreased endurance, Decreased activity tolerance, Impaired UE functional use, Decreased coordination  Visit Diagnosis: Muscle weakness (generalized)  Other lack of coordination    Problem List Patient Active Problem List   Diagnosis Date Noted  . CVA (cerebrovascular accident) (David Jennings) 10/05/2017  . Accelerated hypertension 10/05/2017  . 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 Upper extremity weakness 10/05/2017    [name], MS, OTR/L 11/13/2017, 9:27 AM  [place] MAIN [facility] SERVICES [address] [place], [place], [address] Phone: [phone]   Fax:  [phone]  Name: David Jennings MRN: [id number] Date of Birth: Jun 19, 1941

## 2017-11-13 NOTE — Therapy (Signed)
[place] MAIN [facility] SERVICES [address] [place], [place], [address] Phone: [phone]   Fax:  [phone]  Patient Details  Name: David Jennings MRN: [id number] Date of Birth: 1940-12-25 Referring Provider:  Loletha Grayer, MD  Encounter Date: 11/12/2017  Patient came for treatment; However he was scheduled for OT the next day. We rescheduled PT appointment to the following day to line up with OT to reduce copay charges for the week; will see patient tomorrow; Patient agreeable;  [name],[name] PT, DPT 11/13/2017, 8:10 AM  [place], [place], [address] Phone: [phone]   Fax:  [phone]

## 2017-11-14 ENCOUNTER — Encounter: Payer: Self-pay | Admitting: Physical Therapy

## 2017-11-14 ENCOUNTER — Ambulatory Visit: Payer: Medicare PPO | Admitting: Occupational Therapy

## 2017-11-14 ENCOUNTER — Ambulatory Visit: Payer: Medicare PPO | Admitting: Physical Therapy

## 2017-11-14 ENCOUNTER — Encounter: Payer: Self-pay | Admitting: Occupational Therapy

## 2017-11-14 DIAGNOSIS — R2681 Unsteadiness on feet: Secondary | ICD-10-CM

## 2017-11-14 DIAGNOSIS — R278 Other lack of coordination: Secondary | ICD-10-CM

## 2017-11-14 DIAGNOSIS — M6281 Muscle weakness (generalized): Secondary | ICD-10-CM | POA: Diagnosis not present

## 2017-11-14 NOTE — Therapy (Signed)
[place] MAIN [facility] SERVICES [address] [place], [place], [address] Phone: [phone]   Fax:  [phone]  Occupational Therapy Treatment/Discharge Note  Patient Details  Name: David Jennings MRN: [id number] Date of Birth: [birth year] Referring Provider: Cordelia Poche MD/babaoff, PCP   Encounter Date: 11/14/2017  OT End of Session - 11/14/17 1014    Visit Number  9    Number of Visits  24    Date for OT Re-Evaluation  01/08/18    OT Start Time  0915    OT Stop Time  1000    OT Time Calculation (min)  45 min    Activity Tolerance  Patient tolerated treatment well    Behavior During Therapy  [facility] for tasks assessed/performed       Past Medical History:  Diagnosis Date  . Asthma   . Hypertension     Past Surgical History:  Procedure Laterality Date  . ANKLE SURGERY Right   . BACK SURGERY    . REPLACEMENT TOTAL KNEE BILATERAL    . TONSILLECTOMY      There were no vitals filed for this visit.  Subjective Assessment - 11/14/17 1111    Subjective   Pt. reports he is doing well today.    Pertinent History  Pt. is a 77 y.o. 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 Pt. was was admitted to [facility] where it was determined that he had an acute infarct in the left peremedian lower pons. Pt. presented with RUE wekness, incoordination, and Diplopia  at onset. Diplopia has resolved. pt. Pt. has a history of a right rotator cuff injury a year ago, and needs to have a reverse totall shoulder replacement, however has been recovering from ankle surgery this past year.    Patient Stated Goals  To get back to doing the things that he used to do and understand what problems I still have are from the stroke.    Currently in Pain?  No/denies         [facility] OT Assessment - 11/14/17 1023      Coordination   Right 9 Hole Peg Test  22      Hand Function   Right Hand Grip (lbs)  72#    Right Hand Lateral Pinch  16 lbs    Right Hand 3 Point  Pinch  17 lbs      OT TREATMENT    Measurements were obtained for the RUE grip strength, and pinch strength, and coordination/speed/Dexterity. ADL/IADL goals were reviewed with pt.  Pt. education was provided about translatory movements of the hand, as well as activities that can be completed at home.                     OT Education - 11/14/17 1112    Education provided  Yes    Education Details  [facility] skills    Person(s) Educated  Patient    Methods  Explanation;Demonstration;Verbal cues    Comprehension  Verbalized understanding;Returned demonstration;Need further instruction;Verbal cues required          OT Long Term Goals - 11/14/17 1034      OT LONG TERM GOAL #1   Title  Pt. will improve [facility] skills to be able be able to independently grasp and move objects through his hand efficiently during ADL tasks 100% of the time.  (Pended)     Baseline  Pt. has difficulty with translatory movements of the hand.  (  Pended)     Time  12  (Pended)     Period  Weeks  (Pended)     Status  Partially Met  (Pended)       OT LONG TERM GOAL #2   Title  Pt. will improve right hand motor control to be able to brush his teeth independently, and efficiently.  (Pended)     Baseline  Pt. has incoordination with brushing teeth.  (Pended)     Time  12  (Pended)     Period  Weeks  (Pended)     Status  Achieved  (Pended)       OT LONG TERM GOAL #3   Title  Pt. will write 4 sentences legibily, and efficiently in preparation for IADL paperwork tasks.  (Pended)     Baseline  Pt. has difficulty with stamina for writing more than his name and address.  (Pended)     Time  12  (Pended)     Period  Weeks  (Pended)     Status  On-going  (Pended)       OT LONG TERM GOAL #4   Title  Pt. will demonstrate good motor control, and coordination skills to be able to efficiently, and independently perform fluid hand to mouth patterns during self-feeding.  (Pended)     Baseline  Pt. has difficulty  with more complex movement patterns but can feed self cereal and cut meat with R hand.  (Pended)     Time  12  (Pended)     Period  Weeks  (Pended)     Status  Achieved  (Pended)       OT LONG TERM GOAL #5   Title  Pt. will demonstrate brushing hair with Modified independence  (Pended)     Baseline  limited  (Pended)     Time  12  (Pended)     Period  Weeks  (Pended)     Status  Partially Met  (Pended)             Plan - 11/14/17 1022    Clinical Impression Statement Pt. reports he is now using his right hand more during tasks. Measurements have been obtained. Pt. has made excellent progress with right hand grip strength, pinch strength, hand function, and coordination skills. Pt. has improved with engaging his RUE, and hand during ADL, and IADL tasks. Pt. is now feeding himself using his right hand, brushing his teeth, and attempting to brush his hair. Pt. education was provided about a HEP for conitnued work wth strength, coordination, and translatory movements of the hand. Measurements were obtained, and goals were reviewed with pt. Pt. is now appropriate for discharge from OT services at this time. Pt. is in agreement.     Occupational performance deficits (Please refer to evaluation for details):  ADL's;IADL's    OT Frequency  2x / week    OT Duration  12 weeks    OT Treatment/Interventions  Self-care/ADL training;Therapeutic exercise;Neuromuscular education;Patient/family education;Energy conservation;DME and/or AE instruction;Therapeutic activities    Consulted and Agree with Plan of Care  Patient       Patient will benefit from skilled therapeutic intervention in order to improve the following deficits and impairments:  Decreased strength, Decreased balance, Decreased endurance, Decreased activity tolerance, Impaired UE functional use, Decreased coordination  Visit Diagnosis: Muscle weakness (generalized)  Other lack of coordination    Problem List Patient Active  Problem List   Diagnosis Date Noted  . CVA (cerebrovascular  accident) ([place]) 10/05/2017  . Accelerated hypertension 10/05/2017  . 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 Upper extremity weakness 10/05/2017    [name], MS, OTR/L 11/14/2017, 11:29 AM  [place] MAIN [facility] SERVICES [address] [place], [place], [address] Phone: [phone]   Fax:  [phone]  Name: David Jennings MRN: [id number] Date of Birth: 08-Aug-1941

## 2017-11-14 NOTE — Therapy (Signed)
[place] MAIN [facility] SERVICES [address] [place], [place], [address] Phone: [phone]   Fax:  [phone]  Physical Therapy Treatment  Patient Details  Name: David Jennings MRN: [id number] Date of Birth: [birth year] Referring Provider: Cordelia Poche MD/babaoff, PCP   Encounter Date: 11/14/2017  PT End of Session - 11/14/17 0916    Visit Number  8    Number of Visits  13    Date for PT Re-Evaluation  12/03/17    Authorization Type  $40 copay each visit;     PT Start Time  0925    PT Stop Time  1010    PT Time Calculation (min)  45 min    Equipment Utilized During Treatment  Gait belt    Activity Tolerance  Patient tolerated treatment well    Behavior During Therapy  [facility] for tasks assessed/performed       Past Medical History:  Diagnosis Date  . Asthma   . Hypertension     Past Surgical History:  Procedure Laterality Date  . ANKLE SURGERY Right   . BACK SURGERY    . REPLACEMENT TOTAL KNEE BILATERAL    . TONSILLECTOMY      There were no vitals filed for this visit.  Subjective Assessment - 11/14/17 0934    Subjective  Patient reports doing well; He reports a little stiffness in right ankle but denies any pain; Reports adherence to HEP; denies any new falls;     Pertinent History  77 yo male s/p CVA on 10/02/17. Patient has PMH significant for right ankle replacement with spacer. Patient was released from [place] on 10/02/17 and was told that they were going to leave the spacer. When he got home he reports, "I felt weird. I started having double vision." Patient reports increased numbness in right hand; Patient called Primary MD who referred him to ED for possible CVA work-up; MRI showed acute infarct. He went for a follow up and also got a workup for A-fib which was negative. Patient is supposed to follow up with PCP in 6 months; PMH significant for RUE torn rotator cuff, arthritis in hands, weakness in ankle; patient is currently  using [facility] for gait safety; He has been using cane since Jan 2019. Patient reports that he is using cane as a precaution to help with steadiness;     How long can you sit comfortably?  NA    How long can you stand comfortably?  limited due to ankle issues (ankle replacement surgery x3); does wear compression hose on RLE    How long can you walk comfortably?  about a mile;     Diagnostic tests  MRI on 10/05/17 shows acute infarction affects the LEFT paramedian lower pons.     Patient Stated Goals  get back into the Stability class at [facility];     Currently in Pain?  No/denies    Multiple Pain Sites  No           TREATMENT:  Warm up on Nustep, BUE/BLE level2x4 min (Unbilled);  Instructed patient in RLE ankle strengthening: Ankle DF,  IV, EV 2x15 red tband each direction with mod Vcs to improve ankle ROM and reduce hip/knee motion for better ankle strengthening;  Long sitting RLE ankle AROM in alphabet A-Z with capital letters x1 set; PT performed grade II-III AP mobs to right talocrual joint 15 sec bouts x3 sets followed by passive ankle DF stretch 15 sec hold x3 sets;  PT performed passive RLE ankle circles clockwise/counterclockwise x10 reps each direction;  Pt tolerated well reporting less stiffness in right ankle after exercise/manual therapy;   RLE BAPs board, level 3, ankle DF/PF x15 rep, ankle IV/EV x15 reps with patient having significant difficulty with ankle EV due to weakness and poor ROM in RLE ankle; Patient required several verbal and tactile cues to isolate ankle ROM while on BAPs board. He denies any increase in pain in RLE ankle with advanced ankle exercise;  Gait on treadmill 1.5 mph with 2 HHA with cues to increase step length and slow down gait speed for better reciprocal gait and to improve stretch on right ankle x3 min; able to get good heel strike but then patient often increases heel raise at terminal stance due to decreased ankle DF ROM;   Standing heel  off step calf stretch 20 sec hold x2 reps bilaterally;  Standing on yellow dynadisc with RLE:  -LLE toe on stepping stone unsupported x10 sec with cues to improve weight shift to RLE for better stabilization;  -LLE hip flexion march x10 reps to stepping stone  Standing on 1/2 bolster (round side up) BLE heel raises x15 reps with cues to keep knee straight for better ankle strengthening;               PT Education - 11/14/17 0935    Education provided  Yes    Education Details  ankle strengthening/ROM, gait safety; strengthening;     Person(s) Educated  Patient    Methods  Explanation;Demonstration;Verbal cues    Comprehension  Verbalized understanding;Returned demonstration;Verbal cues required;Need further instruction       PT Short Term Goals - 11/13/17 0939      PT SHORT TERM GOAL #1   Title  Patient will be adherent to HEP at least 3x a week to improve functional strength and balance for better safety at home.    Time  4    Period  Weeks    Status  Achieved      PT SHORT TERM GOAL #2   Title  Patient will improve RLE gross strength to 4+/5 to improve functional strength with gait and standing tasks for less fatigue with ADLs;     Time  4    Period  Weeks    Status  Partially Met    Target Date  11/19/17        PT Long Term Goals - 11/13/17 0940      PT LONG TERM GOAL #1   Title  Patient will be independent in home exercise program to improve strength/mobility for better functional independence with ADLs.    Time  6    Period  Weeks    Status  On-going    Target Date  12/03/17      PT LONG TERM GOAL #2   Title  Patient will tolerate 5 seconds of single leg stance without loss of balance to improve ability to get in and out of shower safely.    Time  6    Period  Weeks    Status  Partially Met    Target Date  12/03/17      PT LONG TERM GOAL #3   Title  Patient will increase Functional Gait Assessment score to >20/30 as to reduce fall risk and improve  dynamic gait safety with community ambulation.    Time  6    Period  Weeks    Status  Achieved  Target Date  12/03/17            Plan - 11/14/17 0959    Clinical Impression Statement  Patient instructed in advanced LE stretches and ROM especially on RLE ankle to faciltiate better ankle mobility; Patient tolerated well without increase in pain in right ankle. He was able to exhibit better step length with improved ankle ROM although is still limited with ankle DF. Patient instructed in advanced balance exercise to facilitate better ankle stabilization for improved stance control; He would benefit from additional skilled PT intervention to improve strength, balance and gait safety;     Rehab Potential  Good    Clinical Impairments Affecting Rehab Potential  motivated, has good family support; negative: recent ankle surgery- cleared by orthopedic;     PT Frequency  2x / week    PT Duration  6 weeks    PT Treatment/Interventions  Cryotherapy;Electrical Stimulation;Moist Heat;Therapeutic exercise;Therapeutic activities;Functional mobility training;Stair training;Gait training;DME Instruction;Balance training;Neuromuscular re-education;Patient/family education;Manual techniques;Taping;Energy conservation;Dry needling;Passive range of motion    PT Next Visit Plan  watch BP with exercise;     PT Home Exercise Plan  continue as given;     Consulted and Agree with Plan of Care  Patient       Patient will benefit from skilled therapeutic intervention in order to improve the following deficits and impairments:  Decreased endurance, Cardiopulmonary status limiting activity, Decreased activity tolerance, Decreased strength, Decreased balance, Decreased mobility  Visit Diagnosis: Muscle weakness (generalized)  Other lack of coordination  Unsteadiness on feet     Problem List Patient Active Problem List   Diagnosis Date Noted  . CVA (cerebrovascular accident) ([place]) 10/05/2017  .  Accelerated hypertension 10/05/2017  . 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 Upper extremity weakness 10/05/2017    Geordan Xu PT, DPT 11/14/2017, 12:41 PM  [place] MAIN [facility] SERVICES [address] [place], [place], [address] Phone: [phone]   Fax:  [phone]  Name: David Jennings MRN: [id number] Date of Birth: 1941-05-07

## 2017-11-14 NOTE — Patient Instructions (Addendum)
Ankle Circle (Ankle Mobility / Flexibility)    Sit facing forward. Extend one leg forward, foot a few inches above floor, with toes pointed (or resting on the floor). Rotate ankle, making circle with foot, while breathing in and out slowly. Repeat _10__ times in each direction (clockwise/counterclockwise) Repeat with other foot. Do _2__ sessions per day.  Copyright  VHI. All rights reserved.  Alphabet    Place pillow under calf so foot does not rub. Moving foot and ankle only, trace the letters of the alphabet from A to Z. Repeat _1-2___ times. Do ___2_ sessions per day.  Copyright  VHI. All rights reserved.

## 2017-11-18 ENCOUNTER — Encounter: Payer: Medicare PPO | Admitting: Occupational Therapy

## 2017-11-18 ENCOUNTER — Ambulatory Visit: Payer: Medicare PPO | Admitting: Physical Therapy

## 2017-11-18 ENCOUNTER — Encounter: Payer: Self-pay | Admitting: Physical Therapy

## 2017-11-18 DIAGNOSIS — M6281 Muscle weakness (generalized): Secondary | ICD-10-CM

## 2017-11-18 DIAGNOSIS — R2681 Unsteadiness on feet: Secondary | ICD-10-CM

## 2017-11-18 DIAGNOSIS — R278 Other lack of coordination: Secondary | ICD-10-CM

## 2017-11-18 NOTE — Therapy (Addendum)
[place] MAIN [facility] SERVICES [address] [place], [place], [address] Phone: [phone]   Fax:  [phone]  Physical Therapy Treatment  Patient Details  Name: David Jennings MRN: [id number] Date of Birth: 04/10/1941 Referring Provider: Cordelia Poche MD/babaoff, PCP   Encounter Date: 11/18/2017    PT End of Session - 11/20/17 0921    Visit Number  9    Number of Visits  13    Date for PT Re-Evaluation  12/03/17    Authorization Type  $40 copay each visit;     PT Start Time  0932    PT Stop Time  1015    PT Time Calculation (min)  43 min    Equipment Utilized During Treatment  Gait belt    Activity Tolerance  Patient tolerated treatment well    Behavior During Therapy  [facility] for tasks assessed/performed        Past Medical History:  Diagnosis Date  . Asthma   . Hypertension     Past Surgical History:  Procedure Laterality Date  . ANKLE SURGERY Right   . BACK SURGERY    . REPLACEMENT TOTAL KNEE BILATERAL    . TONSILLECTOMY      There were no vitals filed for this visit.  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 Patient has PMH significant for right ankle replacement with spacer. Patient was released from [place] on 10/02/17 and was told that they were going to leave the spacer. When he got home he reports, "I felt weird. I started having double vision." Patient reports increased numbness in right hand; Patient called Primary MD who referred him to ED for possible CVA work-up; MRI showed acute infarct. He went for a follow up and also got a workup for A-fib which was negative. Patient is supposed to follow up with PCP in 6 months; PMH significant for RUE torn rotator cuff, arthritis in hands, weakness in  ankle; patient is currently using [facility] for gait safety; He has been using cane since Jan 2019. Patient reports that he is using cane as a precaution to help with steadiness;     How long can you sit comfortably?  NA    How long can you stand comfortably?  limited due to ankle issues (ankle replacement surgery x3); does wear compression hose on RLE    How long can you walk comfortably?  about a mile;     Diagnostic tests  MRI on 10/05/17 shows acute infarction affects the LEFT paramedian lower pons.     Patient Stated Goals  get back into the Stability class at [facility];     Currently in Pain?  No/denies    Multiple Pain Sites  No          TREATMENT:  Warm up onNustep, BUE/BLE level2x4 min (Unbilled);  Instructed patient in RLE ankle strengthening: Ankle DF,  IV, EV, PF 2x15 red tband each direction with mod Vcs to improve ankle ROM and reduce hip/knee motion for better ankle strengthening;  Long sitting PT performed grade II-III AP mobs to right talocrual joint 15 sec bouts x3 sets followed by passive ankle DF stretch 15 sec hold x3 sets;  PT performed passive RLE  ankle circles clockwise/counterclockwise x10 reps each direction;  Pt tolerated well reporting less stiffness in right ankle after exercise/manual therapy;   RLE BAPs board, level 3, ankle DF/PF x15 rep, ankle IV/EV x15 reps with patient having significant difficulty with ankle EV due to weakness and poor ROM in RLE ankle; Patient required several verbal and tactile cues to isolate ankle ROM while on BAPs board. He denies any increase in pain in RLE ankle with advanced ankle exercise;   Standing heel off step calf stretch 20 sec hold x2 reps bilaterally;  Standing on yellow dynadisc with RLE:             -LLE toe on 4 inch step unsupported x10 sec with cues to improve weight shift to RLE for better stabilization with ball pass side/side x5 reps each direction;             -LLE hip flexion march x10 reps to 4 inch  step; requires min A for safety and cues to improve weight shift to improve RLE ankle control;   Standing on 1/2 bolster (round side up) BLE heel raises x15 reps with cues to keep knee straight for better ankle strengthening;   1/2 bolster (flat side up): Tandem stance with 2-0 rail assist, 10 sec hold x3 reps each foot in front with min A for safety and cues to improve lateral weight shift and improve ankle strategies for better stance control;   Standing on airex: SLS on airex with 2-0 rail assist, min A for safety 10 sec hold x3 reps each LE Patient required cues to improve weight shift with initial SLS especially on RLE to avoid loss of balance; With increased repetition, able to exhibit better stance control;                       PT Short Term Goals - 11/13/17 0939      PT SHORT TERM GOAL #1   Title  Patient will be adherent to HEP at least 3x a week to improve functional strength and balance for better safety at home.    Time  4    Period  Weeks    Status  Achieved      PT SHORT TERM GOAL #2   Title  Patient will improve RLE gross strength to 4+/5 to improve functional strength with gait and standing tasks for less fatigue with ADLs;     Time  4    Period  Weeks    Status  Partially Met    Target Date  11/19/17        PT Long Term Goals - 11/13/17 0940      PT LONG TERM GOAL #1   Title  Patient will be independent in home exercise program to improve strength/mobility for better functional independence with ADLs.    Time  6    Period  Weeks    Status  On-going    Target Date  12/03/17      PT LONG TERM GOAL #2   Title  Patient will tolerate 5 seconds of single leg stance without loss of balance to improve ability to get in and out of shower safely.    Time  6    Period  Weeks    Status  Partially Met    Target Date  12/03/17      PT LONG TERM GOAL #3   Title  Patient will increase Functional Gait Assessment score to >20/30 as  to reduce fall  risk and improve dynamic gait safety with community ambulation.    Time  6    Period  Weeks    Status  Achieved    Target Date  12/03/17            Plan - 11/18/17 0955    Clinical Impression Statement  Patient exhibits better joint mobility in right ankle with joint mobs and with PROM. He continues to require cues to improve ankle control with BAPs board and with tband activation. Patinet often initiates ankle movement with hip/knee movement. Patient reports mild fatigue at end of session but denies any increase in pain; Patient would benefit from additional skilled PT intervention to improve strength, balance and gait safety;     Rehab Potential  Good    Clinical Impairments Affecting Rehab Potential  motivated, has good family support; negative: recent ankle surgery- cleared by orthopedic;     PT Frequency  2x / week    PT Duration  6 weeks    PT Treatment/Interventions  Cryotherapy;Electrical Stimulation;Moist Heat;Therapeutic exercise;Therapeutic activities;Functional mobility training;Stair training;Gait training;DME Instruction;Balance training;Neuromuscular re-education;Patient/family education;Manual techniques;Taping;Energy conservation;Dry needling;Passive range of motion    PT Next Visit Plan  watch BP with exercise;     PT Home Exercise Plan  continue as given;     Consulted and Agree with Plan of Care  Patient       Patient will benefit from skilled therapeutic intervention in order to improve the following deficits and impairments:  Decreased endurance, Cardiopulmonary status limiting activity, Decreased activity tolerance, Decreased strength, Decreased balance, Decreased mobility  Visit Diagnosis: Muscle weakness (generalized)  Other lack of coordination  Unsteadiness on feet     Problem List Patient Active Problem List   Diagnosis Date Noted  . CVA (cerebrovascular accident) ([place]) 10/05/2017  . Accelerated hypertension 10/05/2017  . Visual distortion  10/05/2017  . Upper extremity weakness 10/05/2017    [name],MargaretPT, DPT 11/18/2017, 10:16 AM  [place] MAIN [facility] SERVICES [address] [place], [place], [address] Phone: [phone]   Fax:  [phone]  Name: David Jennings MRN: [id number] Date of Birth: 1941/07/05

## 2017-11-20 ENCOUNTER — Ambulatory Visit: Payer: Medicare PPO | Admitting: Physical Therapy

## 2017-11-20 ENCOUNTER — Encounter: Payer: Self-pay | Admitting: Physical Therapy

## 2017-11-20 ENCOUNTER — Encounter: Payer: Medicare PPO | Admitting: Occupational Therapy

## 2017-11-20 DIAGNOSIS — M6281 Muscle weakness (generalized): Secondary | ICD-10-CM | POA: Diagnosis not present

## 2017-11-20 DIAGNOSIS — R2681 Unsteadiness on feet: Secondary | ICD-10-CM

## 2017-11-20 DIAGNOSIS — R278 Other lack of coordination: Secondary | ICD-10-CM

## 2017-11-20 NOTE — Therapy (Signed)
[place] MAIN [facility] SERVICES [address] [place], [place], [address] Phone: [phone]   Fax:  [phone]  Physical Therapy Treatment  Patient Details  Name: David Jennings MRN: [id number] Date of Birth: 1941/03/21 Referring Provider: Cordelia Poche MD/babaoff, PCP   Encounter Date: 11/20/2017  PT End of Session - 11/20/17 0923    Visit Number  10    Number of Visits  13    Date for PT Re-Evaluation  12/03/17    Authorization Type  $40 copay each visit;     PT Start Time  0932    PT Stop Time  1015    PT Time Calculation (min)  43 min    Equipment Utilized During Treatment  Gait belt    Activity Tolerance  Patient tolerated treatment well    Behavior During Therapy  [facility] for tasks assessed/performed       Past Medical History:  Diagnosis Date  . Asthma   . Hypertension     Past Surgical History:  Procedure Laterality Date  . ANKLE SURGERY Right   . BACK SURGERY    . REPLACEMENT TOTAL KNEE BILATERAL    . TONSILLECTOMY      There were no vitals filed for this visit.  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 Patient has PMH significant for right ankle replacement with spacer. Patient was released from [place] on 10/02/17 and was told that they were going to leave the spacer. When he got home he reports, "I felt weird. I started having double vision." Patient reports increased numbness in right hand; Patient called Primary MD who referred him to ED for possible CVA work-up; MRI showed acute infarct. He went for a follow up and also got a workup for A-fib which was negative. Patient is supposed to follow up with PCP in 6 months; PMH significant for RUE torn rotator cuff, arthritis in hands, weakness  in ankle; patient is currently using [facility] for gait safety; He has been using cane since Jan 2019. Patient reports that he is using cane as a precaution to help with steadiness;     How long can you sit comfortably?  NA    How long can you stand comfortably?  limited due to ankle issues (ankle replacement surgery x3); does wear compression hose on RLE    How long can you walk comfortably?  about a mile;     Diagnostic tests  MRI on 10/05/17 shows acute infarction affects the LEFT paramedian lower pons.     Patient Stated Goals  get back into the Stability class at [facility];     Currently in Pain?  No/denies    Multiple Pain Sites  No               TREATMENT:  Warm up onNustep, BUE/BLE level2x4 min (Unbilled);  Instructed patient in RLE ankle strengthening: Ankle DF, IV, EV, 2x12 green tband each direction with mod Vcs to improve ankle ROM and reduce hip/knee motion for better ankle strengthening;  Long sitting PT performed grade II-III AP mobs to right talocrual joint 15 sec bouts x3 sets followed by passive ankle DF stretch 15 sec hold x3 sets;  PT performed passive  RLE ankle circles clockwise/counterclockwise x10 reps each direction;  Pt tolerated well reporting less stiffness in right ankle after exercise/manual therapy;  Leg press: RLE only ankle heel raises 60# 2x12 with min Vcs for positioning and to increase ROM for better calf strengthening;  Standing heel off step calf stretch 20 sec hold x2 reps bilaterally;  Standing on green dynadisc with RLE: -LLE toe on 4 inch step unsupported with BUE ball pass side/side to challenge dynamic stance control; -LLE hip flexion march x10 reps to 4 inch step; requires min A for safety and cues to improve weight shift to improve RLE ankle control;   Standing on 1/2 bolster (round side up) BLE heel raises x15 reps with cues to keep knee straight for better ankle strengthening; 1/2 bolster (flat side  up): Tandem stance with 2-0 rail assist, with head turns side/side#3, x2 reps each foot in front with min A for safety and cues to improve lateral weight shift and improve ankle strategies for better stance control;  Feet apart, BUE wand flexion x10 reps with min A for safety and cues to improve hip/ankle strategies for better stance control;  SLS on firm surface, 1-2 sec RLE; patient has difficulty holding SLS unsupported;   RLE SLS in star, with LLE toe taps in multiple directions x2 sets each;   Standing on 4 inch step, with B rail assist, eccentric step down with LLE to facilitate better RLE ankle DF stretch x10 reps; required min VCs to avoid excessive heel lift for better ankle stretch;                   PT Education - 11/20/17 0921    Education provided  Yes    Education Details  ankle ROM/strengthening;     Person(s) Educated  Patient    Methods  Explanation;Demonstration;Verbal cues    Comprehension  Verbalized understanding;Returned demonstration;Verbal cues required;Need further instruction       PT Short Term Goals - 11/13/17 0939      PT SHORT TERM GOAL #1   Title  Patient will be adherent to HEP at least 3x a week to improve functional strength and balance for better safety at home.    Time  4    Period  Weeks    Status  Achieved      PT SHORT TERM GOAL #2   Title  Patient will improve RLE gross strength to 4+/5 to improve functional strength with gait and standing tasks for less fatigue with ADLs;     Time  4    Period  Weeks    Status  Partially Met    Target Date  11/19/17        PT Long Term Goals - 11/13/17 0940      PT LONG TERM GOAL #1   Title  Patient will be independent in home exercise program to improve strength/mobility for better functional independence with ADLs.    Time  6    Period  Weeks    Status  On-going    Target Date  12/03/17      PT LONG TERM GOAL #2   Title  Patient will tolerate 5 seconds of single leg stance  without loss of balance to improve ability to get in and out of shower safely.    Time  6    Period  Weeks    Status  Partially Met    Target Date  12/03/17      PT LONG  TERM GOAL #3   Title  Patient will increase Functional Gait Assessment score to >20/30 as to reduce fall risk and improve dynamic gait safety with community ambulation.    Time  6    Period  Weeks    Status  Achieved    Target Date  12/03/17            Plan - 11/20/17 1134    Clinical Impression Statement  Patient instructed in advanced ankle strengthening and stabilization exercise. He requires cues to improve ankle strategies when standing on compliant surfaces to reduce fall risk; Patient denies any increase in pain with exercise but does report fatigue. He would benefit from additional skilled PT intervention to improve strength, balance and gait safety;     Rehab Potential  Good    Clinical Impairments Affecting Rehab Potential  motivated, has good family support; negative: recent ankle surgery- cleared by orthopedic;     PT Frequency  2x / week    PT Duration  6 weeks    PT Treatment/Interventions  Cryotherapy;Electrical Stimulation;Moist Heat;Therapeutic exercise;Therapeutic activities;Functional mobility training;Stair training;Gait training;DME Instruction;Balance training;Neuromuscular re-education;Patient/family education;Manual techniques;Taping;Energy conservation;Dry needling;Passive range of motion    PT Next Visit Plan  watch BP with exercise;     PT Home Exercise Plan  continue as given;     Consulted and Agree with Plan of Care  Patient       Patient will benefit from skilled therapeutic intervention in order to improve the following deficits and impairments:  Decreased endurance, Cardiopulmonary status limiting activity, Decreased activity tolerance, Decreased strength, Decreased balance, Decreased mobility  Visit Diagnosis: Muscle weakness (generalized)  Other lack of  coordination  Unsteadiness on feet     Problem List Patient Active Problem List   Diagnosis Date Noted  . CVA (cerebrovascular accident) ([place]) 10/05/2017  . Accelerated hypertension 10/05/2017  . 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 Upper extremity weakness 10/05/2017    [name],[name] PT, DPT 11/20/2017, 11:36 AM  [place] MAIN [facility] SERVICES [address] [place], [place], [address] Phone: [phone]   Fax:  [phone]  Name: David Jennings MRN: [id number] Date of Birth: 1941/09/09

## 2017-11-25 ENCOUNTER — Encounter: Payer: Medicare PPO | Admitting: Occupational Therapy

## 2017-11-25 ENCOUNTER — Ambulatory Visit: Payer: Medicare PPO | Admitting: Physical Therapy

## 2017-11-27 ENCOUNTER — Encounter: Payer: Self-pay | Admitting: Physical Therapy

## 2017-11-27 ENCOUNTER — Encounter: Payer: Medicare PPO | Admitting: Occupational Therapy

## 2017-11-27 ENCOUNTER — Ambulatory Visit: Payer: Medicare PPO | Admitting: Physical Therapy

## 2017-11-27 DIAGNOSIS — M6281 Muscle weakness (generalized): Secondary | ICD-10-CM

## 2017-11-27 DIAGNOSIS — R2681 Unsteadiness on feet: Secondary | ICD-10-CM

## 2017-11-27 DIAGNOSIS — R278 Other lack of coordination: Secondary | ICD-10-CM

## 2017-11-27 NOTE — Therapy (Signed)
[place] MAIN [facility] SERVICES [address] [place], [place], [address] Phone: [phone]   Fax:  [phone]  Physical Therapy Treatment  Patient Details  Name: David Jennings MRN: [id number] Date of Birth: Dec 13, 1940 Referring Provider: Cordelia Poche MD/babaoff, PCP   Encounter Date: 11/27/2017  PT End of Session - 11/27/17 0924    Visit Number  11    Number of Visits  13    Date for PT Re-Evaluation  12/03/17    Authorization Type  $40 copay each visit;     PT Start Time  0925    PT Stop Time  1005    PT Time Calculation (min)  40 min    Equipment Utilized During Treatment  Gait belt    Activity Tolerance  Patient tolerated treatment well    Behavior During Therapy  [facility] for tasks assessed/performed       Past Medical History:  Diagnosis Date  . Asthma   . Hypertension     Past Surgical History:  Procedure Laterality Date  . ANKLE SURGERY Right   . BACK SURGERY    . REPLACEMENT TOTAL KNEE BILATERAL    . TONSILLECTOMY      There were no vitals filed for this visit.  Subjective Assessment - 11/27/17 0926    Subjective  Patient reports doing the stability class at [facility]. He reports that the format has changed some but he was able to do most of it. 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 Patient has PMH significant for right ankle replacement with spacer. Patient was released from [place] on 10/02/17 and was told that they were going to leave the spacer. When he got home he reports, "I felt weird. I started having double vision." Patient reports increased numbness in right hand; Patient called Primary MD who referred him to ED for possible CVA work-up; MRI showed acute infarct. He went for a follow up and also got a workup for A-fib which was negative. Patient is supposed to follow up with PCP in 6 months; PMH  significant for RUE torn rotator cuff, arthritis in hands, weakness in ankle; patient is currently using [facility] for gait safety; He has been using cane since Jan 2019. Patient reports that he is using cane as a precaution to help with steadiness;     How long can you sit comfortably?  NA    How long can you stand comfortably?  limited due to ankle issues (ankle replacement surgery x3); does wear compression hose on RLE    How long can you walk comfortably?  about a mile;     Diagnostic tests  MRI on 10/05/17 shows acute infarction affects the LEFT paramedian lower pons.     Patient Stated Goals  get back into the Stability class at [facility];     Currently in Pain?  No/denies    Multiple Pain Sites  No            TREATMENT:  Instructed patient in RLE ankle strengthening: Ankle DF, IV, EV,2x15 green tband each direction with mod Vcs to improve ankle ROM and reduce hip/knee motion for better ankle strengthening;  Long sitting PT performed grade II-III AP mobs to right talocrual joint 15 sec bouts x3 sets followed by passive ankle DF stretch 15 sec hold x3 sets;  PT performed passive RLE ankle circles clockwise/counterclockwise x10 reps each direction;  Pt tolerated well reporting less stiffness in right ankle after exercise/manual therapy;  Leg press: RLE only ankle heel raises 60# 2x15 with min Vcs for positioning and to increase ROM for better calf strengthening;  Standing heel off step calf stretch 20 sec hold x2 reps bilaterally;  Standing on green dynadisc with RLE: -LLE toe on4 inch stepunsupported with BUE ball pass side/side x5 reps each direction to challenge dynamic stance control; -LLE hip flexion march with toe taps on cone on step x10 reps with 1 HHA; requires min A for safety and cues to improve weight shift to improve RLE ankle control;  Standing on 1/2 bolster (flat side up) BLE heel/toe rockx15 reps with cues to keep knee straight  for better ankle strengthening; 1/2 bolster (flat side up): Tandem stance with 2-0 rail assist, with head turns side/side#3, up/down #3, x1 reps each foot in front with min A for safety and cues to improve lateral weight shift and improve ankle strategies for better stance control;  Feet apart, BUE wand flexion x10 reps with min A for safety and cues to improve hip/ankle strategies for better stance control;   RLE SLS in star, with LLE toe taps in multiple directions x2 sets each;   Patient able to hold SLS on firm surface for 3-4 sec on RLE consistently which is significant improvement from 1 sec during previous sessions;  BP throughout session was 166/88; recommend patient continue monitoring BP and follow up with MD regarding higher systolic.                   PT Education - 11/27/17 [phone]    Education provided  Yes    Education Details  ankle ROM/strengthening, HEP reinforced;     Person(s) Educated  Patient    Methods  Demonstration;Explanation;Verbal cues    Comprehension  Verbalized understanding;Returned demonstration;Verbal cues required;Need further instruction       PT Short Term Goals - 11/13/17 0939      PT SHORT TERM GOAL #1   Title  Patient will be adherent to HEP at least 3x a week to improve functional strength and balance for better safety at home.    Time  4    Period  Weeks    Status  Achieved      PT SHORT TERM GOAL #2   Title  Patient will improve RLE gross strength to 4+/5 to improve functional strength with gait and standing tasks for less fatigue with ADLs;     Time  4    Period  Weeks    Status  Partially Met    Target Date  11/19/17        PT Long Term Goals - 11/13/17 0940      PT LONG TERM GOAL #1   Title  Patient will be independent in home exercise program to improve strength/mobility for better functional independence with ADLs.    Time  6    Period  Weeks    Status  On-going    Target Date  12/03/17      PT LONG TERM  GOAL #2   Title  Patient will tolerate 5 seconds of single leg stance without loss of balance to improve ability to get in and out of shower safely.    Time  6    Period  Weeks    Status  Partially Met    Target Date  12/03/17  PT LONG TERM GOAL #3   Title  Patient will increase Functional Gait Assessment score to >20/30 as to reduce fall risk and improve dynamic gait safety with community ambulation.    Time  6    Period  Weeks    Status  Achieved    Target Date  12/03/17            Plan - 11/27/17 0940    Clinical Impression Statement  Patient instructed in advanced ankle strengthening and stabilization exercise. Able to increase repetition for better strengthening. Patient is responding well to joint mobilizations with improved ankle ROM and less stiffness. He does require min VCs for correct positioning and exercise technique for better stabilization. patient would benefit from additional skilled PT intervention to improve strength, balance and gait safety;     Rehab Potential  Good    Clinical Impairments Affecting Rehab Potential  motivated, has good family support; negative: recent ankle surgery- cleared by orthopedic;     PT Frequency  2x / week    PT Duration  6 weeks    PT Treatment/Interventions  Cryotherapy;Electrical Stimulation;Moist Heat;Therapeutic exercise;Therapeutic activities;Functional mobility training;Stair training;Gait training;DME Instruction;Balance training;Neuromuscular re-education;Patient/family education;Manual techniques;Taping;Energy conservation;Dry needling;Passive range of motion    PT Next Visit Plan  watch BP with exercise;     PT Home Exercise Plan  continue as given;     Consulted and Agree with Plan of Care  Patient       Patient will benefit from skilled therapeutic intervention in order to improve the following deficits and impairments:  Decreased endurance, Cardiopulmonary status limiting activity, Decreased activity tolerance,  Decreased strength, Decreased balance, Decreased mobility  Visit Diagnosis: Muscle weakness (generalized)  Other lack of coordination  Unsteadiness on feet     Problem List Patient Active Problem List   Diagnosis Date Noted  . CVA (cerebrovascular accident) ([place]) 10/05/2017  . Accelerated hypertension 10/05/2017  . 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 Upper extremity weakness 10/05/2017    Demetre Monaco PT, DPT 11/27/2017, 10:09 AM  [place] MAIN [facility] SERVICES [address] [place], [place], [address] Phone: [phone]   Fax:  [phone]  Name: DABNEY SCHANZ MRN: [id number] Date of Birth: 17-Jul-1941

## 2017-12-02 ENCOUNTER — Ambulatory Visit: Payer: Medicare PPO | Admitting: Physical Therapy

## 2017-12-02 ENCOUNTER — Encounter: Payer: Medicare PPO | Admitting: Occupational Therapy

## 2017-12-04 ENCOUNTER — Ambulatory Visit: Payer: Medicare PPO | Admitting: Physical Therapy

## 2017-12-04 ENCOUNTER — Encounter: Payer: Medicare PPO | Admitting: Occupational Therapy

## 2017-12-11 ENCOUNTER — Encounter: Payer: Medicare PPO | Admitting: Occupational Therapy

## 2017-12-11 ENCOUNTER — Ambulatory Visit: Payer: Medicare PPO | Attending: Cardiovascular Disease | Admitting: Physical Therapy

## 2017-12-11 ENCOUNTER — Encounter: Payer: Self-pay | Admitting: Physical Therapy

## 2017-12-11 VITALS — BP 154/96

## 2017-12-11 DIAGNOSIS — R2681 Unsteadiness on feet: Secondary | ICD-10-CM | POA: Insufficient documentation

## 2017-12-11 DIAGNOSIS — M6281 Muscle weakness (generalized): Secondary | ICD-10-CM | POA: Diagnosis present

## 2017-12-11 DIAGNOSIS — R278 Other lack of coordination: Secondary | ICD-10-CM | POA: Insufficient documentation

## 2017-12-11 NOTE — Addendum Note (Signed)
Addended by: Collie Siad D on: 12/11/2017 12:07 PM   Modules accepted: Orders

## 2017-12-11 NOTE — Therapy (Signed)
[place] MAIN [facility] SERVICES [address] [place], [place], [address] Phone: [phone]   Fax:  [phone]  Physical Therapy Treatment  Patient Details  Name: David Jennings MRN: [id number] Date of Birth: 1941-07-05 Referring Provider: Cordelia Poche MD/babaoff, PCP   Encounter Date: 12/11/2017  PT End of Session - 12/11/17 1059    Visit Number  12    Number of Visits  19    Date for PT Re-Evaluation  01/22/18    Authorization Type  $40 copay each visit;     PT Start Time  1059    PT Stop Time  1146    PT Time Calculation (min)  47 min    Equipment Utilized During Treatment  Gait belt    Activity Tolerance  Patient tolerated treatment well    Behavior During Therapy  [facility] for tasks assessed/performed       Past Medical History:  Diagnosis Date  . Asthma   . Hypertension     Past Surgical History:  Procedure Laterality Date  . ANKLE SURGERY Right   . BACK SURGERY    . REPLACEMENT TOTAL KNEE BILATERAL    . TONSILLECTOMY      Vitals:   12/11/17 1106  BP: (!) 154/96    Subjective Assessment - 12/11/17 1102    Subjective  Pt says that when he went to his stability class at [facility] on Tuesday he worked too much and caused some pain in R ankle.  He has been trying to take it easy.  Pt denies pain currently.     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 Patient has PMH significant for right ankle replacement with spacer. Patient was released from [place] on 10/02/17 and was told that they were going to leave the spacer. When he got home he reports, "I felt weird. I started having double vision." Patient reports increased numbness in right hand; Patient called Primary MD who referred him to ED for possible CVA work-up; MRI showed acute infarct. He went for a follow up and also got a workup for A-fib which was negative. Patient is supposed to follow up with PCP in 6 months; PMH significant for RUE torn rotator cuff, arthritis in  hands, weakness in ankle; patient is currently using [facility] for gait safety; He has been using cane since Jan 2019. Patient reports that he is using cane as a precaution to help with steadiness;     How long can you sit comfortably?  NA    How long can you stand comfortably?  limited due to ankle issues (ankle replacement surgery x3); does wear compression hose on RLE    How long can you walk comfortably?  about a mile;     Diagnostic tests  MRI on 10/05/17 shows acute infarction affects the LEFT paramedian lower pons.     Patient Stated Goals  get back into the Stability class at [facility];     Currently in Pain?  No/denies          TREATMENT  Pt instructed to take his R ankle brace off to trial exercise without it this session for improved stability and strength.  SLS RLE 2x30 seconds with intermittent UE support due to imbalance.  Toe tapping to randomized balance stone x20 with LLE while RLE in SLS.  Forward lunges with R foot stepping onto airex pad without UE support. Cues to avoid anterior rotation of R pelvis. Repeated with L  foot stepping forward onto airex with R foot behind x15.  Leg press: RLE only ankle heel raises 60# 2x15 with min Vcs for positioning and to increase ROM for better calf strengthening  1/2 foam roll: BLE heel/toe rockx15 reps with cues to keep knee straight for better ankle strengthening  Manual Therapy:  Long sitting: PT performed grade II-III AP mobs to right talocrual joint 15 sec bouts x3 sets followed by passive ankle DF stretch 15 sec hold x3 sets  PT performed passive RLE ankle circles, clockwise/counterclockwise x10 reps each direction                 PT Education - 12/11/17 1058    Education provided  Yes    Education Details  Exercise technique    Person(s) Educated  Patient    Methods  Explanation;Demonstration;Verbal cues    Comprehension  Verbalized understanding;Returned demonstration;Verbal cues required;Need further instruction        PT Short Term Goals - 11/13/17 0939      PT SHORT TERM GOAL #1   Title  Patient will be adherent to HEP at least 3x a week to improve functional strength and balance for better safety at home.    Time  4    Period  Weeks    Status  Achieved      PT SHORT TERM GOAL #2   Title  Patient will improve RLE gross strength to 4+/5 to improve functional strength with gait and standing tasks for less fatigue with ADLs;     Time  4    Period  Weeks    Status  Partially Met    Target Date  11/19/17        PT Long Term Goals - 12/11/17 1106      PT LONG TERM GOAL #1   Title  Patient will be independent in home exercise program to improve strength/mobility for better functional independence with ADLs.    Baseline  3/13: pt currently performing HEP 3 days/wk    Time  4    Period  Weeks    Status  Partially Met      PT LONG TERM GOAL #2   Title  Patient will tolerate 5 seconds of single leg stance without loss of balance to improve ability to get in and out of shower safely.    Baseline  On 3/13: 2 seconds before LOB    Time  4    Period  Weeks    Status  On-going      PT LONG TERM GOAL #3   Title  Patient will increase Functional Gait Assessment score to >20/30 as to reduce fall risk and improve dynamic gait safety with community ambulation.    Time  6    Period  Weeks    Status  Achieved      PT LONG TERM GOAL #4   Title  Pt will be able to participate in stability class without use of R ankle brace without increase in pain in R ankle for improved stability and strength    Time  4    Period  Weeks    Status  New            Plan - 12/11/17 1100    Clinical Impression Statement  Pt's BP was elevated again this session.  Pt reports he spoke with his MD about his elevated BP ~2 wks who said that he wants the BP to be higher to promote perfusion.  MD suggested to not make any changes to BP medication.  Pt has been tracking his BP readings at home.  Pt participated in full PT  session without use of ankle brace to trial this with goal to decrease reliance on ankle brace as this is one of the pt's goal.  Pt tolerated all interventions well this date.  Provided encouragement to completed HEP at least 5 days/wk moving forward.  Pt demonstrates decreased stability with R SLS and thus focused interventions of R SLS dynamic activities. Pt will benefit from continued skilled PT interventions for improved ankle stability and decreased soreness.     Rehab Potential  Good    Clinical Impairments Affecting Rehab Potential  motivated, has good family support; negative: recent ankle surgery- cleared by orthopedic;     PT Frequency  1x / week    PT Duration  6 weeks    PT Treatment/Interventions  Cryotherapy;Electrical Stimulation;Moist Heat;Therapeutic exercise;Therapeutic activities;Functional mobility training;Stair training;Gait training;DME Instruction;Balance training;Neuromuscular re-education;Patient/family education;Manual techniques;Taping;Energy conservation;Dry needling;Passive range of motion    PT Next Visit Plan  watch BP with exercise;     PT Home Exercise Plan  continue as given;     Consulted and Agree with Plan of Care  Patient       Patient will benefit from skilled therapeutic intervention in order to improve the following deficits and impairments:  Decreased endurance, Cardiopulmonary status limiting activity, Decreased activity tolerance, Decreased strength, Decreased balance, Decreased mobility  Visit Diagnosis: Muscle weakness (generalized)  Other lack of coordination  Unsteadiness on feet     Problem List Patient Active Problem List   Diagnosis Date Noted  . CVA (cerebrovascular accident) ([place]) 10/05/2017  . Accelerated hypertension 10/05/2017  . 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 Upper extremity weakness 10/05/2017    Collie Siad PT, DPT 12/11/2017, 12:03 PM  [place] MAIN [facility] SERVICES [address] [place], [place], [address] Phone: [phone]   Fax:  [phone]  Name: David Jennings MRN: [id number] Date of Birth: 03-17-1941

## 2017-12-16 ENCOUNTER — Ambulatory Visit: Payer: Medicare PPO | Admitting: Physical Therapy

## 2017-12-16 ENCOUNTER — Encounter: Payer: Medicare PPO | Admitting: Occupational Therapy

## 2017-12-18 ENCOUNTER — Ambulatory Visit: Payer: Medicare PPO | Admitting: Physical Therapy

## 2017-12-18 ENCOUNTER — Encounter: Payer: Medicare PPO | Admitting: Occupational Therapy

## 2017-12-23 ENCOUNTER — Ambulatory Visit: Payer: Medicare PPO | Admitting: Physical Therapy

## 2017-12-23 ENCOUNTER — Encounter: Payer: Medicare PPO | Admitting: Occupational Therapy

## 2017-12-25 ENCOUNTER — Encounter: Payer: Medicare PPO | Admitting: Occupational Therapy

## 2017-12-25 ENCOUNTER — Encounter: Payer: Self-pay | Admitting: Physical Therapy

## 2017-12-25 ENCOUNTER — Ambulatory Visit: Payer: Medicare PPO | Admitting: Physical Therapy

## 2017-12-25 DIAGNOSIS — R2681 Unsteadiness on feet: Secondary | ICD-10-CM

## 2017-12-25 DIAGNOSIS — R278 Other lack of coordination: Secondary | ICD-10-CM

## 2017-12-25 DIAGNOSIS — M6281 Muscle weakness (generalized): Secondary | ICD-10-CM

## 2017-12-25 NOTE — Therapy (Signed)
[place] MAIN [facility] SERVICES [address] [place], [place], [address] Phone: [phone]   Fax:  [phone]  Physical Therapy Session.  Treatment not completed.  Patient Details  Name: David Jennings MRN: [id number] Date of Birth: [birth year] Referring Provider: Cordelia Poche MD/babaoff, PCP   Encounter Date: 12/25/2017  PT End of Session - 12/25/17 1504    Visit Number  --    Number of Visits  --    Date for PT Re-Evaluation  --    Authorization Type  --    PT Start Time  --    Equipment Utilized During Treatment  --    Activity Tolerance  --    Behavior During Therapy  --       Past Medical History:  Diagnosis Date  . Asthma   . Hypertension     Past Surgical History:  Procedure Laterality Date  . ANKLE SURGERY Right   . BACK SURGERY    . REPLACEMENT TOTAL KNEE BILATERAL    . TONSILLECTOMY      There were no vitals filed for this visit.  Subjective Assessment - 12/25/17 1513    Subjective  Pt missed his appointment last week because his BP was high (191/95).  MD adjusted his medication and instructed him to stick to this change for 2 weeks to assess for effectiveneess.  He reports he is feeling well this date but has not been completing his HEP due to his elevated BP.     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 Patient has PMH significant for right ankle replacement with spacer. Patient was released from [place] on 10/02/17 and was told that they were going to leave the spacer. When he got home he reports, "I felt weird. I started having double vision." Patient reports increased numbness in right hand; Patient called Primary MD who referred him to ED for possible CVA work-up; MRI showed acute infarct. He went for a follow up and also got a workup for A-fib which was negative. Patient is supposed to follow up with PCP in 6 months; PMH significant for RUE torn rotator cuff, arthritis in hands, weakness in ankle; patient is  currently using [facility] for gait safety; He has been using cane since Jan 2019. Patient reports that he is using cane as a precaution to help with steadiness;     How long can you sit comfortably?  NA    How long can you stand comfortably?  limited due to ankle issues (ankle replacement surgery x3); does wear compression hose on RLE    How long can you walk comfortably?  about a mile;     Diagnostic tests  MRI on 10/05/17 shows acute infarction affects the LEFT paramedian lower pons.     Patient Stated Goals  get back into the Stability class at [facility];                 No data recorded               PT Education - 12/25/17 1504    Education provided  Yes    Education Details  Educated on reasoning behind avoiding exertional activities until he receives instruction from his MD.      Terence Lux) Educated  Patient    Methods  Explanation    Comprehension  Verbalized understanding       PT Short Term Goals - 11/13/17 6384      PT  SHORT TERM GOAL #1   Title  Patient will be adherent to HEP at least 3x a week to improve functional strength and balance for better safety at home.    Time  4    Period  Weeks    Status  Achieved      PT SHORT TERM GOAL #2   Title  Patient will improve RLE gross strength to 4+/5 to improve functional strength with gait and standing tasks for less fatigue with ADLs;     Time  4    Period  Weeks    Status  Partially Met    Target Date  11/19/17        PT Long Term Goals - 12/11/17 1106      PT LONG TERM GOAL #1   Title  Patient will be independent in home exercise program to improve strength/mobility for better functional independence with ADLs.    Baseline  3/13: pt currently performing HEP 3 days/wk    Time  4    Period  Weeks    Status  Partially Met      PT LONG TERM GOAL #2   Title  Patient will tolerate 5 seconds of single leg stance without loss of balance to improve ability to get in and out of shower safely.    Baseline   On 3/13: 2 seconds before LOB    Time  4    Period  Weeks    Status  On-going      PT LONG TERM GOAL #3   Title  Patient will increase Functional Gait Assessment score to >20/30 as to reduce fall risk and improve dynamic gait safety with community ambulation.    Time  6    Period  Weeks    Status  Achieved      PT LONG TERM GOAL #4   Title  Pt will be able to participate in stability class without use of R ankle brace without increase in pain in R ankle for improved stability and strength    Time  4    Period  Weeks    Status  New            Plan - 12/25/17 1538    Clinical Impression Statement  Vitals taken at start of session resting in seated position: pulse 108-113, SpO2 96%, BP 156/69. Pt denies any dizziness, lightheadedness, says he is feeling well. Pt denies being very active today or feeling stressed. Called Dr. Abbe Amsterdam office to report pt's resting HR. Spoke with receptionist who was unable to reach Dr. Loney Hering, his PA, or his nurse so receptionist left a message with Dr. Loney Hering to call this therapist when he is able. Decision was made with this therapist and the pt to move forward with his PT appointment but to watch his pulse throughout the session. However, just ambulating over to // bars the pt's pulse elevated to 121. The decision was then made to d/c session today and for the pt to avoid exertional activity moving forward today. Once this therapist receives a call from Dr. Abbe Amsterdam office, it will be suggested that Dr. Loney Hering speak directly to the patient with his recommendations. Pt verbalized understanding and was appreciative.     Rehab Potential  Good    Clinical Impairments Affecting Rehab Potential  motivated, has good family support; negative: recent ankle surgery- cleared by orthopedic;     PT Frequency  1x / week    PT Duration  6 weeks  PT Treatment/Interventions  Cryotherapy;Electrical Stimulation;Moist Heat;Therapeutic exercise;Therapeutic  activities;Functional mobility training;Stair training;Gait training;DME Instruction;Balance training;Neuromuscular re-education;Patient/family education;Manual techniques;Taping;Energy conservation;Dry needling;Passive range of motion    PT Next Visit Plan  watch BP with exercise;     PT Home Exercise Plan  continue as given;     Consulted and Agree with Plan of Care  Patient       Patient will benefit from skilled therapeutic intervention in order to improve the following deficits and impairments:  Decreased endurance, Cardiopulmonary status limiting activity, Decreased activity tolerance, Decreased strength, Decreased balance, Decreased mobility  Visit Diagnosis: Muscle weakness (generalized)  Other lack of coordination  Unsteadiness on feet     Problem List Patient Active Problem List   Diagnosis Date Noted  . CVA (cerebrovascular accident) ([place]) 10/05/2017  . Accelerated hypertension 10/05/2017  . 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 Upper extremity weakness 10/05/2017    Collie Siad PT, DPT 12/25/2017, 3:43 PM  [place] MAIN [facility] SERVICES [address] [place], [place], [address] Phone: [phone]   Fax:  [phone]  Name: SHAUL TRAUTMAN MRN: [id number] Date of Birth: 11/12/1940

## 2018-01-02 ENCOUNTER — Ambulatory Visit: Payer: Medicare PPO | Admitting: Physical Therapy

## 2018-01-08 ENCOUNTER — Encounter: Payer: Self-pay | Admitting: Physical Therapy

## 2018-01-08 ENCOUNTER — Ambulatory Visit: Payer: Medicare PPO | Admitting: Physical Therapy

## 2018-01-15 ENCOUNTER — Ambulatory Visit: Payer: Medicare PPO | Admitting: Physical Therapy

## 2018-01-22 ENCOUNTER — Ambulatory Visit: Payer: Medicare PPO | Admitting: Physical Therapy

## 2018-10-01 HISTORY — PX: ANKLE SURGERY: SHX546

## 2019-02-04 DIAGNOSIS — Z6841 Body Mass Index (BMI) 40.0 and over, adult: Secondary | ICD-10-CM | POA: Insufficient documentation

## 2019-08-07 ENCOUNTER — Other Ambulatory Visit: Payer: Self-pay | Admitting: Physical Medicine and Rehabilitation

## 2019-08-07 DIAGNOSIS — M5416 Radiculopathy, lumbar region: Secondary | ICD-10-CM

## 2019-08-12 ENCOUNTER — Other Ambulatory Visit: Payer: Self-pay

## 2019-08-12 ENCOUNTER — Ambulatory Visit
Admission: RE | Admit: 2019-08-12 | Discharge: 2019-08-12 | Disposition: A | Discharge status: Home / Self Care | Payer: Medicare PPO | Source: Ambulatory Visit | Attending: Physical Medicine and Rehabilitation | Admitting: Physical Medicine and Rehabilitation

## 2019-08-12 DIAGNOSIS — M5416 Radiculopathy, lumbar region: Secondary | ICD-10-CM | POA: Diagnosis not present

## 2019-08-17 ENCOUNTER — Other Ambulatory Visit: Payer: Self-pay | Admitting: Family Medicine

## 2019-08-17 DIAGNOSIS — M5136 Other intervertebral disc degeneration, lumbar region: Secondary | ICD-10-CM

## 2019-08-25 ENCOUNTER — Other Ambulatory Visit: Payer: Self-pay | Admitting: Family Medicine

## 2019-08-25 ENCOUNTER — Ambulatory Visit
Admission: RE | Admit: 2019-08-25 | Discharge: 2019-08-25 | Disposition: A | Discharge status: Home / Self Care | Payer: Medicare PPO | Source: Ambulatory Visit | Attending: Family Medicine | Admitting: Family Medicine

## 2019-08-25 ENCOUNTER — Other Ambulatory Visit: Payer: Self-pay

## 2019-08-25 DIAGNOSIS — M5136 Other intervertebral disc degeneration, lumbar region: Secondary | ICD-10-CM

## 2019-08-25 MED ORDER — IOPAMIDOL (ISOVUE-M 200) INJECTION 41%
1.0000 mL | Freq: Once | INTRAMUSCULAR | Status: AC
  Administered 2019-08-25: 1 mL via EPIDURAL

## 2019-08-25 MED ORDER — METHYLPREDNISOLONE ACETATE 40 MG/ML INJ SUSP (RADIOLOG
120.0000 mg | Freq: Once | INTRAMUSCULAR | Status: AC
  Administered 2019-08-25: 120 mg via EPIDURAL

## 2019-08-25 NOTE — Discharge Instructions (Signed)
Spinal Injection Discharge Instruction Sheet ° °1. You may resume a regular diet and any medications that you routinely take, including pain medications. ° °2. No driving the rest of the day of the procedure. ° °3. Light activity throughout the rest of the day.  Do not do any strenuous work, exercise, bending or lifting.  The day following the procedure, you may resume normal physical activity but you should refrain from exercising or physical therapy for at least three days. ° ° °Common Side Effects: ° °· Headaches- take your usual medications as directed by your physician.   ° °· Restlessness or inability to sleep- you may have trouble sleeping for the next few days.  Ask your referring physician if you need any medication for sleep if over the counter sleep medications do not help. ° °· Facial flushing or redness- this should subside within a few days. ° °· Increased pain- a temporary increase in pain a day or two following your procedure is not unusual.  Take your pain medication as prescribed by your referring physician.  You may use ice to the injection site as needed.  Please do not use heat for 24 hours. ° °· Leg cramps ° °Please contact our office at [phone] for the following symptoms: °· Fever greater than 100 degrees. °· Headaches unresolved with medication after 2-3 days. °· Increased swelling, pain, or redness at injection site. ° °Thank you for visiting our office. ° ° °You may resume Plavix today. °

## 2020-11-10 ENCOUNTER — Other Ambulatory Visit: Payer: Self-pay | Admitting: Physical Medicine and Rehabilitation

## 2020-11-10 DIAGNOSIS — M5412 Radiculopathy, cervical region: Secondary | ICD-10-CM

## 2020-11-21 ENCOUNTER — Ambulatory Visit: Payer: Medicare PPO

## 2020-11-22 ENCOUNTER — Other Ambulatory Visit: Payer: Self-pay

## 2020-11-22 ENCOUNTER — Ambulatory Visit
Admission: RE | Admit: 2020-11-22 | Discharge: 2020-11-22 | Disposition: A | Discharge status: Home / Self Care | Payer: Medicare PPO | Source: Ambulatory Visit | Attending: Physical Medicine and Rehabilitation | Admitting: Physical Medicine and Rehabilitation

## 2020-11-22 DIAGNOSIS — M5412 Radiculopathy, cervical region: Secondary | ICD-10-CM | POA: Insufficient documentation

## 2021-02-03 ENCOUNTER — Encounter: Payer: Self-pay | Admitting: Emergency Medicine

## 2021-02-03 ENCOUNTER — Other Ambulatory Visit: Payer: Self-pay

## 2021-02-03 ENCOUNTER — Emergency Department
Admission: EM | Admit: 2021-02-03 | Discharge: 2021-02-03 | Disposition: A | Discharge status: Home / Self Care | Payer: Medicare PPO | Attending: Emergency Medicine | Admitting: Emergency Medicine

## 2021-02-03 ENCOUNTER — Emergency Department: Payer: Medicare PPO

## 2021-02-03 DIAGNOSIS — Z7951 Long term (current) use of inhaled steroids: Secondary | ICD-10-CM | POA: Insufficient documentation

## 2021-02-03 DIAGNOSIS — I10 Essential (primary) hypertension: Secondary | ICD-10-CM | POA: Diagnosis not present

## 2021-02-03 DIAGNOSIS — U071 COVID-19: Secondary | ICD-10-CM | POA: Insufficient documentation

## 2021-02-03 DIAGNOSIS — Z79899 Other long term (current) drug therapy: Secondary | ICD-10-CM | POA: Insufficient documentation

## 2021-02-03 DIAGNOSIS — Z96653 Presence of artificial knee joint, bilateral: Secondary | ICD-10-CM | POA: Insufficient documentation

## 2021-02-03 DIAGNOSIS — Z7902 Long term (current) use of antithrombotics/antiplatelets: Secondary | ICD-10-CM | POA: Insufficient documentation

## 2021-02-03 DIAGNOSIS — J45909 Unspecified asthma, uncomplicated: Secondary | ICD-10-CM | POA: Insufficient documentation

## 2021-02-03 DIAGNOSIS — Z8546 Personal history of malignant neoplasm of prostate: Secondary | ICD-10-CM | POA: Diagnosis not present

## 2021-02-03 DIAGNOSIS — R059 Cough, unspecified: Secondary | ICD-10-CM | POA: Diagnosis present

## 2021-02-03 NOTE — ED Triage Notes (Signed)
Pt comes into the ED via POV c/o being COVID positive as of yesterday and having increased SHOB.  Pt also explains it is hard to take a deep breath.  Pt currently has even and unlabored respirations.  Pt does have a h/o asthma.  Pt denies any chest pain.  Pt currently in NAD.

## 2021-02-03 NOTE — ED Provider Notes (Signed)
[facility] Emergency Department Provider Note  ____________________________________________   I have reviewed the triage vital signs and the nursing notes.   HISTORY  Chief Complaint Shortness of Breath and Covid Positive   History limited by: Not Limited   HPI David Jennings is a 80 y.o. male who presents to the emergency department today at the advice of his doctor because of a home o2 sat of 89 in the setting of known covid positive status. The patient states that someone in his living facility had tested positive so they tested everyone and he came back positive. He has been having symptoms of some cough, fatigue, taste loss, decreased appetite and diarrhea. Prior to testing positive his doctor had prescribed him breathing treatments and steroids. After testing positive he was started on paxlovid.   Records reviewed. Per medical record review patient has a history of asthma.   Past Medical History:  Diagnosis Date  . Asthma   . Hypertension     Patient Active Problem List   Diagnosis Date Noted  . BMI 40.0-44.9, adult ([place]) 02/04/2019  . CVA (cerebrovascular accident) ([place]) 10/05/2017  . Accelerated hypertension 10/05/2017  . 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 Upper extremity weakness 10/05/2017  . Malignant neoplasm of prostate ([place]) 06/23/2012  . Kidney stone 06/23/2012    Past Surgical History:  Procedure Laterality Date  . ANKLE SURGERY Right   . BACK SURGERY    . REPLACEMENT TOTAL KNEE BILATERAL    . TONSILLECTOMY      Prior to Admission medications   Medication Sig Start Date End Date Taking? Authorizing Provider  albuterol (PROVENTIL HFA) 108 (90 Base) MCG/ACT inhaler Inhale 2 puffs into the lungs every 6 (six) hours as needed for wheezing. 08/29/17 11/27/17  [provider]  atorvastatin (LIPITOR) 40 MG tablet Take 1 tablet (40 mg total) by mouth daily at 6 PM. 10/06/17   Loletha Grayer, MD  budesonide-formoterol  [facility]) 160-4.5 MCG/ACT inhaler Inhale 2 puffs into the lungs 2 (two) times daily. 08/08/16   [provider]  carboxymethylcellulose (REFRESH TEARS) 0.5 % SOLN Apply 1-2 drops to eye daily as needed for dry eyes.    [provider]  clopidogrel (PLAVIX) 75 MG tablet Take 1 tablet (75 mg total) by mouth daily. 10/07/17   Loletha Grayer, MD  DOCUSATE SODIUM PO Take 1 tablet by mouth every other day.    [provider]  losartan-hydrochlorothiazide (HYZAAR) 100-25 MG tablet Take 1 tablet by mouth daily. 08/08/17   [provider]  montelukast (SINGULAIR) 10 MG tablet Take 10 mg by mouth daily. 06/23/12   [provider]  Multiple Vitamin (MULTI-VITAMINS) TABS Take 1 tablet by mouth daily.    [provider]  oxybutynin (DITROPAN) 5 MG tablet Take 5 mg by mouth 2 (two) times daily. 09/16/17   [provider]  ranitidine (ZANTAC) 150 MG tablet Take 150 mg by mouth daily as needed for heartburn.    [provider]  traMADol (ULTRAM) 50 MG tablet Take 50 mg by mouth every 8 (eight) hours as needed for pain.    [provider]    Allergies Patient has no known allergies.  History reviewed. No pertinent family history.  Social History Social History   Tobacco Use  . Smoking status: Never Smoker  . Smokeless tobacco: Never Used  Vaping Use  . Vaping Use: Never used  Substance Use Topics  . Alcohol use: Yes    Comment: rarely  . Drug  use: No    Review of Systems Constitutional: No fever/chills Eyes: No visual changes. ENT: Positive for decreased taste.  Cardiovascular: Denies chest pain. Respiratory: Positive for cough and shortness of breath. Gastrointestinal: Positive for decreased appetite, diarrhea.   Genitourinary: Negative for dysuria. Musculoskeletal: Negative for back pain. Skin: Negative for rash. Neurological: Negative for headaches, focal weakness or  numbness.  ____________________________________________   PHYSICAL EXAM:  VITAL SIGNS: ED Triage Vitals  Enc Vitals Group     BP 02/03/21 1530 (!) 154/93     Pulse Rate 02/03/21 1530 72     Resp 02/03/21 1530 20     Temp 02/03/21 1530 98.4 F (36.9 C)     Temp Source 02/03/21 1530 Oral     SpO2 02/03/21 1530 93 %     Weight 02/03/21 1525 265 lb (120.2 kg)     Height 02/03/21 1525 5\' 8"  (1.727 m)     Head Circumference --      Peak Flow --      Pain Score 02/03/21 1525 0   Constitutional: Alert and oriented.  Eyes: Conjunctivae are normal.  ENT      Head: Normocephalic and atraumatic.      Nose: No congestion/rhinnorhea.      Mouth/Throat: Mucous membranes are moist.      Neck: No stridor. Hematological/Lymphatic/Immunilogical: No cervical lymphadenopathy. Cardiovascular: Normal rate, regular rhythm.  No murmurs, rubs, or gallops.  Respiratory: Normal respiratory effort without tachypnea nor retractions. Breath sounds are clear and equal bilaterally. No wheezes/rales/rhonchi. Gastrointestinal: Soft and non tender. No rebound. No guarding.  Genitourinary: Deferred Musculoskeletal: Normal range of motion in all extremities. No lower extremity edema. Neurologic:  Normal speech and language. No gross focal neurologic deficits are appreciated.  Skin:  Skin is warm, dry and intact. No rash noted. Psychiatric: Mood and affect are normal. Speech and behavior are normal. Patient exhibits appropriate insight and judgment.  ____________________________________________    LABS (pertinent positives/negatives)  None  ____________________________________________   EKG  I, Nance Pear, attending physician, personally viewed and interpreted this EKG  EKG Time: 1527 Rate: 71 Rhythm: sinus rhythm with frequent PACs Axis: left axis deviation Intervals: qtc 455 QRS: RBBB, LAFB ST changes: no st elevation Impression: abnormal  ekg  ____________________________________________    RADIOLOGY  CXR Mild infrahilar bilateral infiltrate vs atelectasis.    ____________________________________________   PROCEDURES  Procedures  ____________________________________________   INITIAL IMPRESSION / ASSESSMENT AND PLAN / ED COURSE  Pertinent labs & imaging results that were available during my care of the patient were reviewed by me and considered in my medical decision making (see chart for details).   Patient presented to the emergency department today with signs and symptoms consistent with known diagnosis of covid. The patient was concerned that he had a low o2 reading at home. Here the patient's o2 level stayed above 90 both at rest and with ambulation. Patient has already started paxlovid. At this time will plan on discharging home.  ____________________________________________   FINAL CLINICAL IMPRESSION(S) / ED DIAGNOSES  Final diagnoses:  COVID-19     Note: This dictation was prepared with Dragon dictation. Any transcriptional errors that result from this process are unintentional     Nance Pear, MD 02/03/21 1702

## 2021-02-03 NOTE — ED Notes (Signed)
Dr. Goodman at bedside.  

## 2021-02-03 NOTE — ED Notes (Signed)
Patient ambulated in room, oxygen 91-94%, denies shortness of breath while ambulating.

## 2021-02-03 NOTE — Discharge Instructions (Signed)
Please seek medical attention for any high fevers, chest pain, shortness of breath, change in behavior, persistent vomiting, bloody stool or any other new or concerning symptoms.  

## 2021-03-08 ENCOUNTER — Other Ambulatory Visit: Payer: Self-pay | Admitting: Neurosurgery

## 2021-03-21 ENCOUNTER — Other Ambulatory Visit: Admission: RE | Admit: 2021-03-21 | Payer: Medicare PPO | Source: Ambulatory Visit

## 2021-04-18 ENCOUNTER — Other Ambulatory Visit
Admission: RE | Admit: 2021-04-18 | Discharge: 2021-04-18 | Disposition: A | Discharge status: Home / Self Care | Payer: Medicare PPO | Source: Ambulatory Visit | Attending: Neurosurgery | Admitting: Neurosurgery

## 2021-04-18 ENCOUNTER — Other Ambulatory Visit: Payer: Self-pay

## 2021-04-18 DIAGNOSIS — I10 Essential (primary) hypertension: Secondary | ICD-10-CM | POA: Insufficient documentation

## 2021-04-18 DIAGNOSIS — Z01818 Encounter for other preprocedural examination: Secondary | ICD-10-CM | POA: Insufficient documentation

## 2021-04-18 HISTORY — DX: Dermatitis, unspecified: L30.9

## 2021-04-18 HISTORY — DX: Unspecified osteoarthritis, unspecified site: M19.90

## 2021-04-18 HISTORY — DX: Cerebral infarction, unspecified: I63.9

## 2021-04-18 HISTORY — DX: COVID-19: U07.1

## 2021-04-18 HISTORY — DX: Sleep apnea, unspecified: G47.30

## 2021-04-18 HISTORY — DX: Pneumonia, unspecified organism: J18.9

## 2021-04-18 HISTORY — DX: Dyspnea, unspecified: R06.00

## 2021-04-18 LAB — BASIC METABOLIC PANEL
Anion gap: 9 (ref 5–15)
BUN: 18 mg/dL (ref 8–23)
CO2: 28 mmol/L (ref 22–32)
Calcium: 9.1 mg/dL (ref 8.9–10.3)
Chloride: 102 mmol/L (ref 98–111)
Creatinine, Ser: 0.71 mg/dL (ref 0.61–1.24)
GFR, Estimated: >60 mL/min (ref >60)
Glucose, Bld: 93 mg/dL (ref 70–99)
Potassium: 3.5 mmol/L (ref 3.5–5.1)
Sodium: 139 mmol/L (ref 135–145)

## 2021-04-18 LAB — URINALYSIS, ROUTINE W REFLEX MICROSCOPIC
Bilirubin Urine: NEGATIVE
Glucose, UA: NEGATIVE mg/dL
Ketones, ur: NEGATIVE mg/dL
Leukocytes,Ua: NEGATIVE
Nitrite: NEGATIVE
Protein, ur: NEGATIVE mg/dL
Specific Gravity, Urine: 1.012 (ref 1.005–1.030)
Squamous Epithelial / HPF: NONE SEEN (ref 0–5)
pH: 7 (ref 5.0–8.0)

## 2021-04-18 LAB — TYPE AND SCREEN
ABO/RH(D): AB POS
Antibody Screen: NEGATIVE

## 2021-04-18 LAB — CBC
HCT: 45.3 % (ref 39.0–52.0)
Hemoglobin: 15.4 g/dL (ref 13.0–17.0)
MCH: 31.6 pg (ref 26.0–34.0)
MCHC: 34 g/dL (ref 30.0–36.0)
MCV: 92.8 fL (ref 80.0–100.0)
Platelets: 216 10*3/uL (ref 150–400)
RBC: 4.88 MIL/uL (ref 4.22–5.81)
RDW: 12.7 % (ref 11.5–15.5)
WBC: 6.3 10*3/uL (ref 4.0–10.5)
nRBC: 0 % (ref 0.0–0.2)

## 2021-04-18 LAB — APTT: aPTT: 32 seconds (ref 24–36)

## 2021-04-18 LAB — PROTIME-INR
INR: 1 (ref 0.8–1.2)
Prothrombin Time: 13.6 seconds (ref 11.4–15.2)

## 2021-04-18 NOTE — Progress Notes (Signed)
  Perioperative Services  Abnormal Lab Notification  Date: 04/18/21  Name: David Jennings MRN:   [id number]  Re: Abnormal labs noted during PAT appointment  Provider Notified: Meade Maw, MD Notification mode: Routed and/or faxed via [place] of concern: Lab Results  Component Value Date   STAPHAUREUS POSITIVE (A) 04/18/2021   MRSAPCR POSITIVE (A) 04/18/2021     Notes: Patient is scheduled for a L2-3 & L3-4 DECOMPRESSION; LEFT L5-S1 FORAMINOTOMY on 04/26/2021. He is scheduled to receive CEFAZOLIN pre-operatively. Surgical PCR (+) for MRSA; see above.  PLANS:  Review renal function. Estimated Creatinine Clearance (by C-G formula based on SCr of 0.71 mg/dL) Review allergies. No documented allergy to vancomycin. CEFAZOLIN discontinued Order added for VANCOMYCIN 1 GRAM IV to preoperative prophylactic regimen.  Notify primary attending surgeon of (+) MRSA result and additional order placed for antibiotic by perioperative APP.   This is a Community education officer; no formal response is required.  Honor Loh, MSN, APRN, FNP-C, CEN [facility]  Peri-operative Services Nurse Practitioner Phone: [phone] 04/18/21 1:27 PM

## 2021-04-18 NOTE — Patient Instructions (Addendum)
Your procedure is scheduled on: 04/26/21 -Wednesday Report to the Registration Desk on the 1st floor of the [facility]. To find out your arrival time, please call [phone] between 1PM - 3PM on: 04/25/21 - Tuesday  REMEMBER: Instructions that are not followed completely may result in serious medical risk, up to and including death; or upon the discretion of your surgeon and anesthesiologist your surgery may need to be rescheduled.  Do not eat food after midnight the night before surgery.  No gum chewing, lozengers or hard candies.  You may however, drink CLEAR liquids up to 2 hours before you are scheduled to arrive for your surgery. Do not drink anything within 2 hours of your scheduled arrival time.  Clear liquids include: - water  - apple juice without pulp - gatorade (not RED, PURPLE, OR BLUE) - black coffee or tea (Do NOT add milk or creamers to the coffee or tea) Do NOT drink anything that is not on this list.  TAKE THESE MEDICATIONS THE MORNING OF SURGERY WITH A SIP OF WATER:  - amLODipine (NORVASC) 5 MG tablet - atenolol (TENORMIN) 25 MG tablet - BREO ELLIPTA 100-25 MCG/INH AEPB - fluticasone (FLONASE) 50 MCG/ACT nasal spray - oxybutynin (DITROPAN) 5 MG tablet  Use inhaler  albuterol (VENTOLIN HFA) 108 (90 Base) MCG/ACT on the day of surgery and bring to the hospital.  Follow recommendations from Cardiologist, Pulmonologist or PCP regarding stopping Aspirin, Coumadin, Plavix, Eliquis, Pradaxa, or Pletal. Stop taking Plavix beginning 04/18/21, may restart 2 weeks post-op on 05/10/21.  One week prior to surgery: Stop Anti-inflammatories (NSAIDS) such as Advil, Aleve, Ibuprofen, Motrin, Naproxen, Naprosyn and Aspirin based products such as Excedrin, Goodys Powder, BC Powder.  Stop ANY OVER THE COUNTER supplements until after surgery. Stop Multivitamin starting 04/18/21.  You may take Tylenol if needed for pain up until the day of surgery as directed.  No Alcohol for  24 hours before or after surgery.  No Smoking including e-cigarettes for 24 hours prior to surgery.  No chewable tobacco products for at least 6 hours prior to surgery.  No nicotine patches on the day of surgery.  Do not use any "recreational" drugs for at least a week prior to your surgery.  Please be advised that the combination of cocaine and anesthesia may have negative outcomes, up to and including death. If you test positive for cocaine, your surgery will be cancelled.  On the morning of surgery brush your teeth with toothpaste and water, you may rinse your mouth with mouthwash if you wish. Do not swallow any toothpaste or mouthwash.  Do not wear jewelry, make-up, hairpins, clips or nail polish.  Do not wear lotions, powders, or perfumes.   Do not shave body from the neck down 48 hours prior to surgery just in case you cut yourself which could leave a site for infection.  Also, freshly shaved skin may become irritated if using the CHG soap.  Contact lenses, hearing aids and dentures may not be worn into surgery.  Do not bring valuables to the hospital. [facility] is not responsible for any missing/lost belongings or valuables.   Use CHG Soap or wipes as directed on instruction sheet.  Bring CPAP machine with you the day of surgery.  Notify your doctor if there is any change in your medical condition (cold, fever, infection).  Wear comfortable clothing (specific to your surgery type) to the hospital.  After surgery, you can help prevent lung complications by doing breathing exercises.  Take deep breaths and cough every 1-2 hours. Your doctor may order a device called an Incentive Spirometer to help you take deep breaths. When coughing or sneezing, hold a pillow firmly against your incision with both hands. This is called "splinting." Doing this helps protect your incision. It also decreases belly discomfort.  If you are being admitted to the hospital overnight, leave your  suitcase in the car. After surgery it may be brought to your room.  If you are being discharged the day of surgery, you will not be allowed to drive home. You will need a responsible adult (18 years or older) to drive you home and stay with you that night.   If you are taking public transportation, you will need to have a responsible adult (18 years or older) with you. Please confirm with your physician that it is acceptable to use public transportation.   Please call the [facility] at [phone] if you have any questions about these instructions.  Surgery Visitation Policy:  Patients undergoing a surgery or procedure may have one family member or support person with them as long as that person is not COVID-19 positive or experiencing its symptoms.  That person may remain in the waiting area during the procedure.  Inpatient Visitation:    Visiting hours are 7 a.m. to 8 p.m. Inpatients will be allowed two visitors daily. The visitors may change each day during the patient's stay. No visitors under the age of 7. Any visitor under the age of 44 must be accompanied by an adult. The visitor must pass COVID-19 screenings, use hand sanitizer when entering and exiting the patient's room and wear a mask at all times, including in the patient's room. Patients must also wear a mask when staff or their visitor are in the room. Masking is required regardless of vaccination status.

## 2021-04-19 LAB — SURGICAL PCR SCREEN
MRSA, PCR: POSITIVE — AB
Staphylococcus aureus: POSITIVE — AB

## 2021-04-19 IMAGING — MR MR LUMBAR SPINE W/O CM
5 series · 31 of 48 positions shown · non-contrast
Comparison: 08/12/2019 and prior.

CLINICAL DATA: Lower back pain radiating into left lower extremity.

EXAM:
MRI LUMBAR SPINE WITHOUT CONTRAST
TECHNIQUE: Multiplanar, multisequence MR imaging of the lumbar spine was
performed. No intravenous contrast was administered.

[Series 5: T2 · sagittal · 4.0mm · 0.81mm/px · 6 of 18 slices shown (1 of 2)]
[im 1/18]
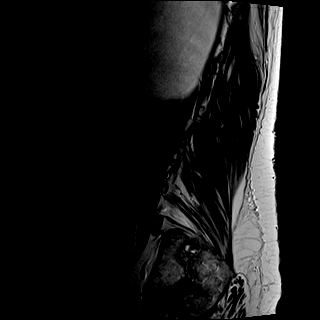
[im 4/18]
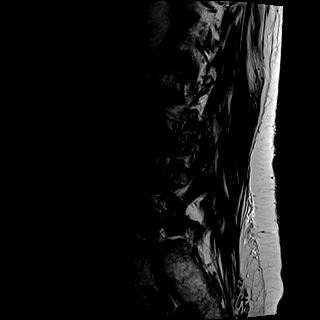
[im 7/18]
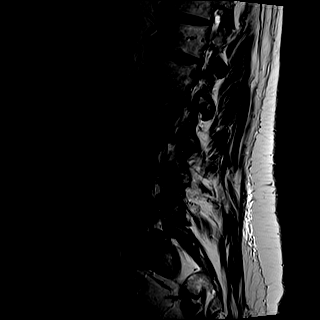
[im 11/18]
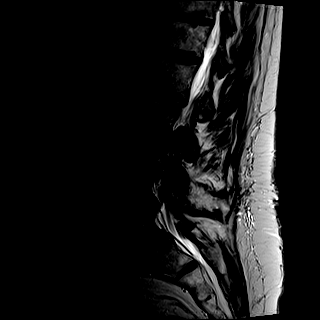
[im 14/18]
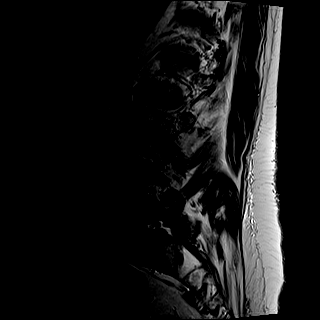
[im 18/18]
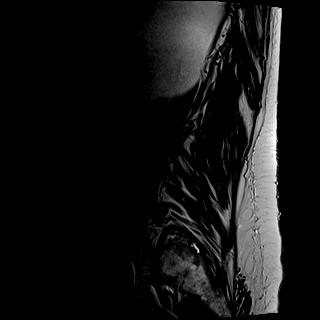

[Series 6: T1 · sagittal · 4.0mm · 0.81mm/px · 7 of 18 slices shown (1 of 2)]
[im 1/18]
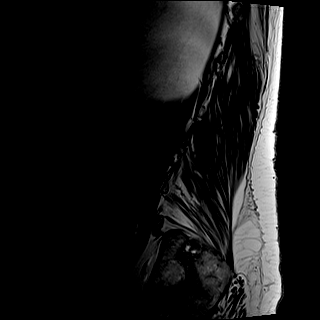
[im 3/18]
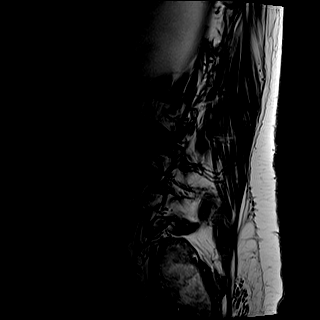
[im 6/18]
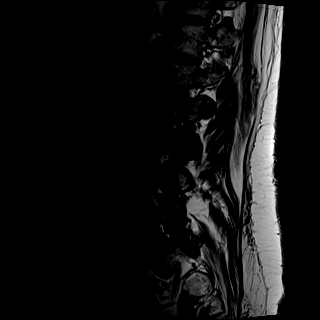
[im 9/18]
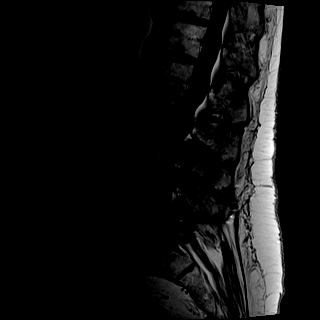
[im 12/18]
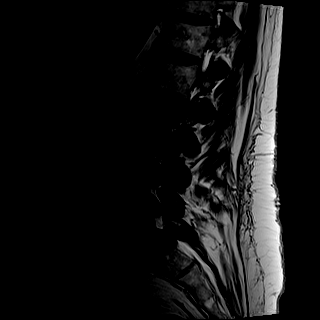
[im 15/18]
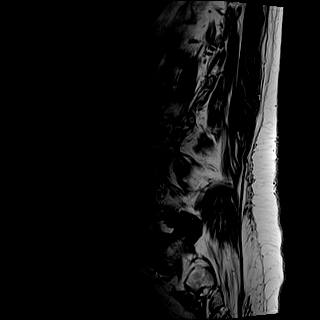
[im 18/18]
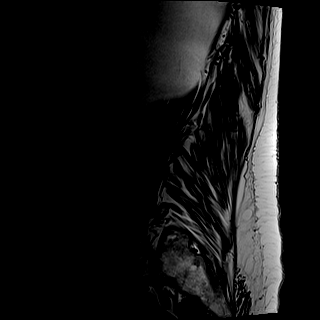

[Series 7: STIR · sagittal · 4.0mm · 0.41mm/px · 2 of 18 slices shown]
[im 1/18]
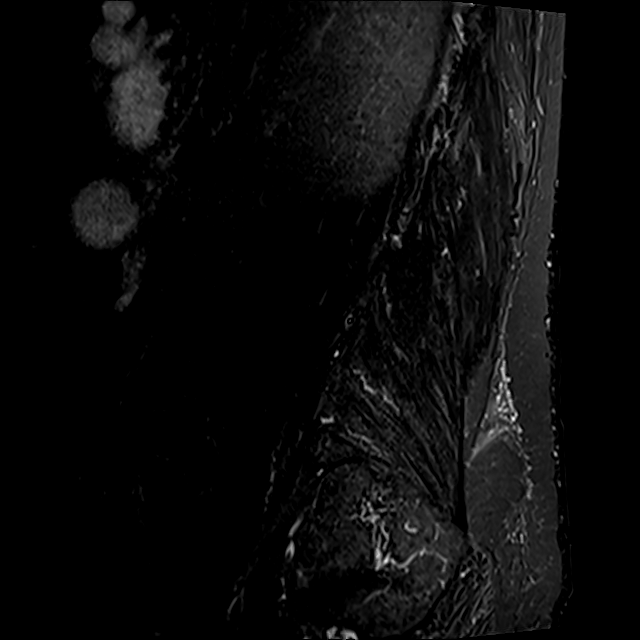
[im 3/18]
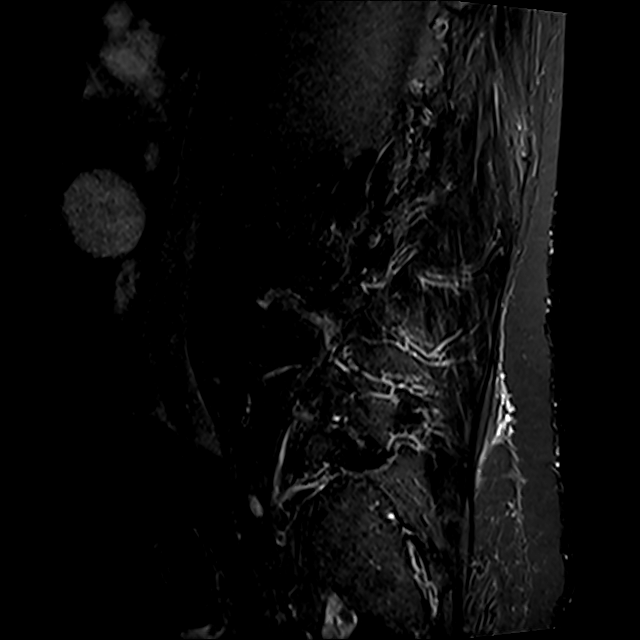

[Series 8: T2 · axial · 4.0mm · 0.78mm/px · z∈[-125,+83]mm · 8 of 36 slices shown (2 of 2)]
[im 1/36]
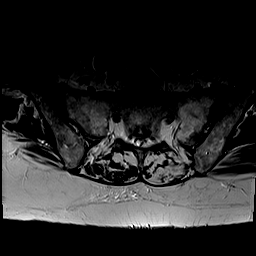
[im 6/36]
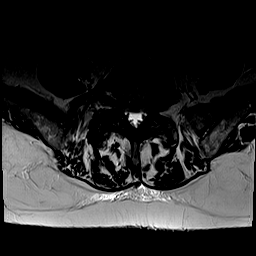
[im 11/36]
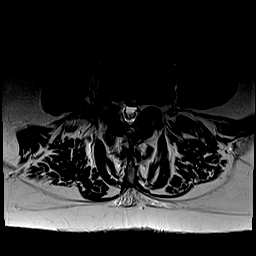
[im 17/36]
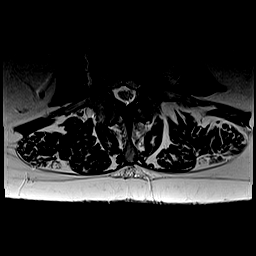
[im 19/36]
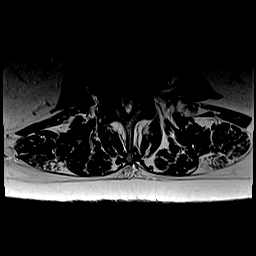
[im 25/36]
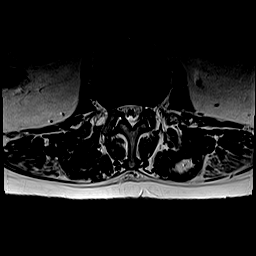
[im 30/36]
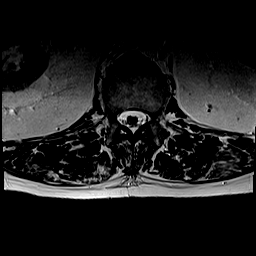
[im 36/36]
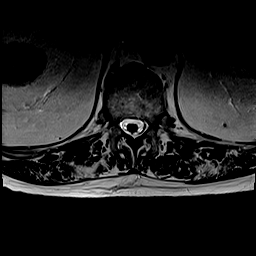

[Series 9: T1 · axial · 4.0mm · 0.39mm/px · z∈[-125,+83]mm · 8 of 36 slices shown (2 of 2)]
[im 1/36]
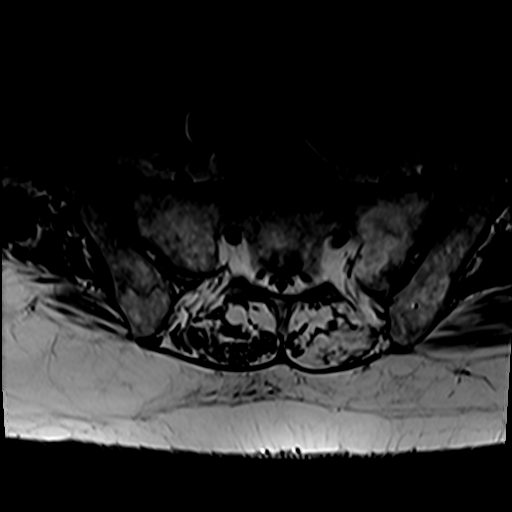
[im 6/36]
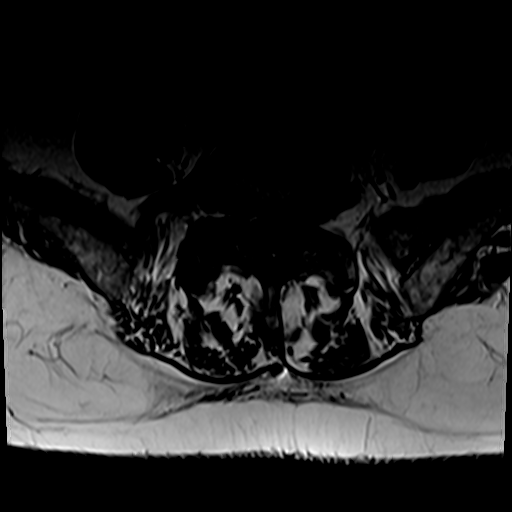
[im 11/36]
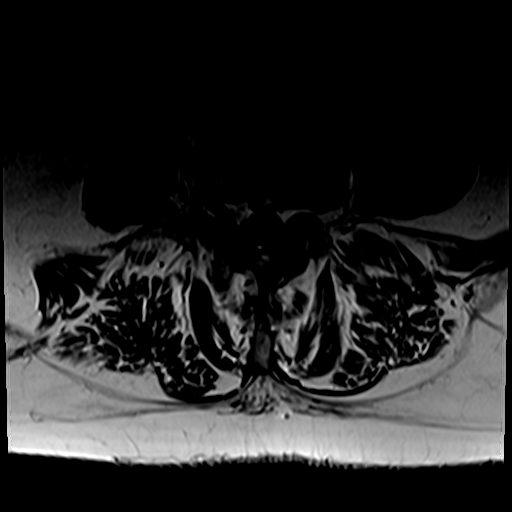
[im 17/36]
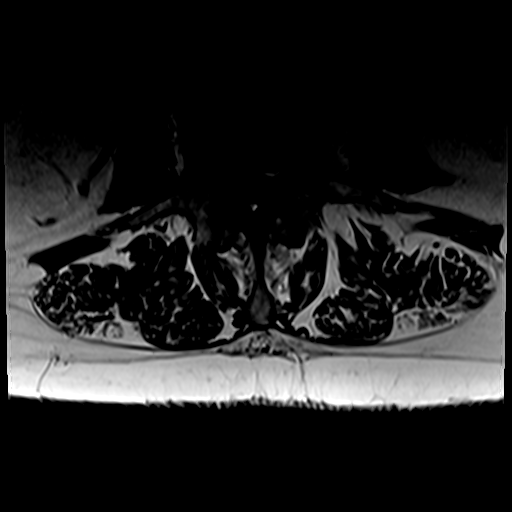
[im 19/36]
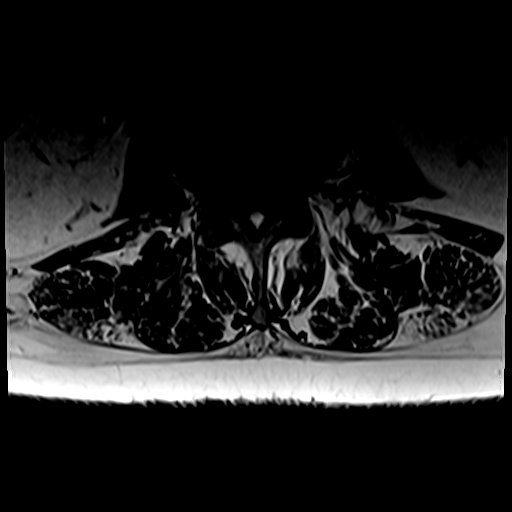
[im 25/36]
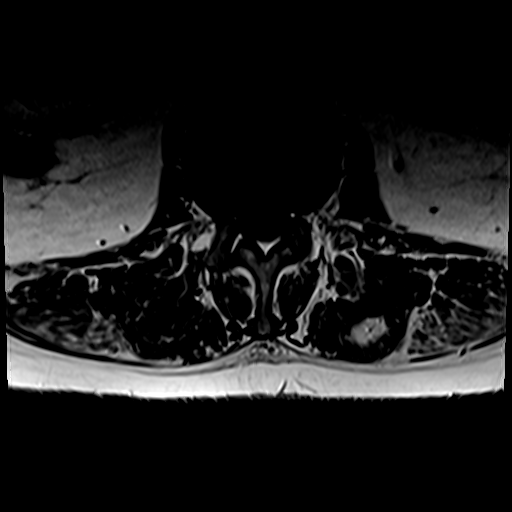
[im 30/36]
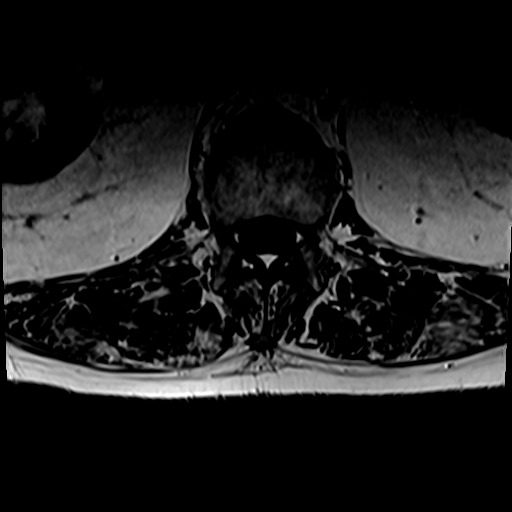
[im 36/36]
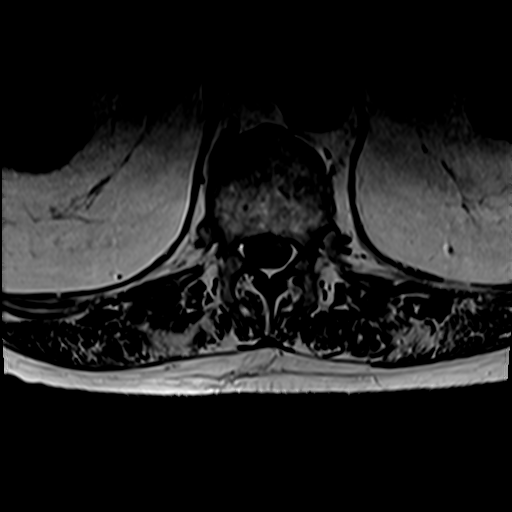

[31 of 48 positions shown; findings below may reference images not displayed]

FINDINGS: Segmentation:  Standard.

Alignment: Minimal stepwise retrolisthesis at the L1-3 levels. Grade
1 L5-S1 anterolisthesis.

Vertebrae: Vertebral body heights are preserved. Scattered
hemangiomata versus focal fat. No aggressive osseous lesion.

Conus medullaris and cauda equina: Conus extends to the L2 level.
Conus and cauda equina appear normal.

Disc levels: Multilevel desiccation and Schmorl's node formation.

L1-2: Trace retrolisthesis with uncovered bulge and facet
hypertrophy. Mild spinal canal and bilateral neural foraminal
narrowing.

L2-3: Disc bulge with inferiorly migrated left subarticular
extrusion, less conspicuous than prior exam. Facet hypertrophy and
ligamentum flavum thickening. Moderate spinal canal and severe
bilateral neural foraminal narrowing.

L3-4: Disc bulge, ligamentum flavum thickening and bilateral facet
hypertrophy. Severe spinal canal and bilateral neural foraminal
narrowing.

L4-5: Disc bulge with shallow central protrusion. Sequela of left
laminectomy. Bilateral facet hypertrophy. Patent spinal canal.
Moderate right and mild left neural foraminal narrowing.

L5-S1: Grade 1 anterolisthesis with uncovered bulge. Exuberant facet
hypertrophy. Patent spinal canal. Mild bilateral neural foraminal
narrowing.

Paraspinal and other soft tissues: Negative.
IMPRESSION: Decreased conspicuity of inferiorly migrated left L2-3 subarticular
extrusion. Moderate spinal canal and severe bilateral neural
foraminal narrowing at this level.

Severe spinal canal and bilateral neural foraminal narrowing at the
L3-4 level.

Moderate right L4-5 neural foraminal narrowing.

## 2021-04-26 ENCOUNTER — Encounter: Payer: Self-pay | Admitting: Neurosurgery

## 2021-04-26 ENCOUNTER — Encounter
Admission: RE | Disposition: A | Discharge status: Home / Self Care | Payer: Self-pay | Source: Home / Self Care | Attending: Neurosurgery

## 2021-04-26 ENCOUNTER — Ambulatory Visit: Payer: Medicare PPO | Admitting: Urgent Care

## 2021-04-26 ENCOUNTER — Ambulatory Visit: Payer: Medicare PPO

## 2021-04-26 ENCOUNTER — Observation Stay
Admission: RE | Admit: 2021-04-26 | Discharge: 2021-04-27 | Disposition: A | Discharge status: Home / Self Care | Payer: Medicare PPO | Attending: Neurosurgery | Admitting: Neurosurgery

## 2021-04-26 ENCOUNTER — Other Ambulatory Visit: Payer: Self-pay

## 2021-04-26 DIAGNOSIS — M5416 Radiculopathy, lumbar region: Secondary | ICD-10-CM | POA: Insufficient documentation

## 2021-04-26 DIAGNOSIS — J45909 Unspecified asthma, uncomplicated: Secondary | ICD-10-CM | POA: Diagnosis not present

## 2021-04-26 DIAGNOSIS — M48062 Spinal stenosis, lumbar region with neurogenic claudication: Principal | ICD-10-CM | POA: Insufficient documentation

## 2021-04-26 DIAGNOSIS — G9519 Other vascular myelopathies: Secondary | ICD-10-CM | POA: Diagnosis present

## 2021-04-26 DIAGNOSIS — Z96651 Presence of right artificial knee joint: Secondary | ICD-10-CM | POA: Insufficient documentation

## 2021-04-26 DIAGNOSIS — Z419 Encounter for procedure for purposes other than remedying health state, unspecified: Secondary | ICD-10-CM

## 2021-04-26 DIAGNOSIS — Z79899 Other long term (current) drug therapy: Secondary | ICD-10-CM | POA: Insufficient documentation

## 2021-04-26 DIAGNOSIS — Z8546 Personal history of malignant neoplasm of prostate: Secondary | ICD-10-CM | POA: Diagnosis not present

## 2021-04-26 DIAGNOSIS — Z96653 Presence of artificial knee joint, bilateral: Secondary | ICD-10-CM | POA: Insufficient documentation

## 2021-04-26 DIAGNOSIS — Z85820 Personal history of malignant melanoma of skin: Secondary | ICD-10-CM | POA: Insufficient documentation

## 2021-04-26 DIAGNOSIS — I10 Essential (primary) hypertension: Secondary | ICD-10-CM | POA: Diagnosis not present

## 2021-04-26 DIAGNOSIS — R29818 Other symptoms and signs involving the nervous system: Secondary | ICD-10-CM | POA: Diagnosis present

## 2021-04-26 DIAGNOSIS — Z96661 Presence of right artificial ankle joint: Secondary | ICD-10-CM | POA: Insufficient documentation

## 2021-04-26 HISTORY — PX: LUMBAR LAMINECTOMY/DECOMPRESSION MICRODISCECTOMY: SHX5026

## 2021-04-26 LAB — ABO/RH: ABO/RH(D): AB POS

## 2021-04-26 SURGERY — LUMBAR LAMINECTOMY/DECOMPRESSION MICRODISCECTOMY 3 LEVELS
Anesthesia: General

## 2021-04-26 MED ORDER — FENTANYL CITRATE (PF) 100 MCG/2ML IJ SOLN
INTRAMUSCULAR | Status: AC
  Filled 2021-04-26: qty 2

## 2021-04-26 MED ORDER — SODIUM CHLORIDE 0.9 % IV SOLN
INTRAVENOUS | Status: DC | PRN
  Administered 2021-04-26: 40 mL

## 2021-04-26 MED ORDER — LACTATED RINGERS IV SOLN: INTRAVENOUS | Status: DC

## 2021-04-26 MED ORDER — BISACODYL 10 MG RE SUPP: 10.0000 mg | Freq: Every day | RECTAL | Status: DC | PRN

## 2021-04-26 MED ORDER — ATENOLOL 25 MG PO TABS
25.0000 mg | ORAL_TABLET | Freq: Every day | ORAL | Status: DC
  Administered 2021-04-26: 25 mg via ORAL
  Filled 2021-04-26: qty 1

## 2021-04-26 MED ORDER — KETOROLAC TROMETHAMINE 30 MG/ML IJ SOLN
INTRAMUSCULAR | Status: DC | PRN
  Administered 2021-04-26: 30 mg via INTRAVENOUS

## 2021-04-26 MED ORDER — SODIUM CHLORIDE 0.9 % IV SOLN: 250.0000 mL | INTRAVENOUS | Status: DC

## 2021-04-26 MED ORDER — IPRATROPIUM-ALBUTEROL 0.5-2.5 (3) MG/3ML IN SOLN: 3.0000 mL | Freq: Three times a day (TID) | RESPIRATORY_TRACT | Status: DC | PRN

## 2021-04-26 MED ORDER — SODIUM CHLORIDE 0.9% FLUSH
3.0000 mL | Freq: Two times a day (BID) | INTRAVENOUS | Status: DC
  Administered 2021-04-26 – 2021-04-27 (×3): 3 mL via INTRAVENOUS

## 2021-04-26 MED ORDER — VANCOMYCIN HCL 1000 MG IV SOLR: 1000.0000 mg | Freq: Once | INTRAVENOUS | Status: DC

## 2021-04-26 MED ORDER — ONDANSETRON HCL 4 MG/2ML IJ SOLN
INTRAMUSCULAR | Status: AC
  Filled 2021-04-26: qty 2

## 2021-04-26 MED ORDER — FLUTICASONE FUROATE-VILANTEROL 100-25 MCG/INH IN AEPB
1.0000 | INHALATION_SPRAY | Freq: Every day | RESPIRATORY_TRACT | Status: DC
  Administered 2021-04-27: 1 via RESPIRATORY_TRACT
  Filled 2021-04-26: qty 28

## 2021-04-26 MED ORDER — MIDAZOLAM HCL 2 MG/2ML IJ SOLN
INTRAMUSCULAR | Status: DC | PRN
  Administered 2021-04-26: 2 mg via INTRAVENOUS

## 2021-04-26 MED ORDER — 0.9 % SODIUM CHLORIDE (POUR BTL) OPTIME
TOPICAL | Status: DC | PRN
  Administered 2021-04-26: 1000 mL

## 2021-04-26 MED ORDER — DOXYCYCLINE HYCLATE 100 MG PO TABS: 100.0000 mg | ORAL_TABLET | Freq: Every day | ORAL | Status: DC

## 2021-04-26 MED ORDER — ALBUTEROL SULFATE (2.5 MG/3ML) 0.083% IN NEBU: 3.0000 mL | INHALATION_SOLUTION | Freq: Four times a day (QID) | RESPIRATORY_TRACT | Status: DC | PRN

## 2021-04-26 MED ORDER — PROPOFOL 10 MG/ML IV BOLUS
INTRAVENOUS | Status: AC
  Filled 2021-04-26: qty 40

## 2021-04-26 MED ORDER — KETAMINE HCL 50 MG/ML IJ SOLN
INTRAMUSCULAR | Status: DC | PRN
  Administered 2021-04-26: 50 mg via INTRAMUSCULAR

## 2021-04-26 MED ORDER — FLEET ENEMA 7-19 GM/118ML RE ENEM: 1.0000 | ENEMA | Freq: Once | RECTAL | Status: DC | PRN

## 2021-04-26 MED ORDER — CHLORHEXIDINE GLUCONATE 0.12 % MT SOLN: 15.0000 mL | Freq: Once | OROMUCOSAL | Status: AC

## 2021-04-26 MED ORDER — PHENOL 1.4 % MT LIQD
1.0000 | OROMUCOSAL | Status: DC | PRN
  Filled 2021-04-26: qty 177

## 2021-04-26 MED ORDER — BUPIVACAINE HCL (PF) 0.5 % IJ SOLN
INTRAMUSCULAR | Status: DC | PRN
  Administered 2021-04-26: 20 mL

## 2021-04-26 MED ORDER — PREDNISONE 10 MG PO TABS: 10.0000 mg | ORAL_TABLET | Freq: Every day | ORAL | Status: DC

## 2021-04-26 MED ORDER — HYDROCHLOROTHIAZIDE 25 MG PO TABS
25.0000 mg | ORAL_TABLET | Freq: Every day | ORAL | Status: DC
  Administered 2021-04-27: 25 mg via ORAL
  Filled 2021-04-26: qty 1

## 2021-04-26 MED ORDER — LIDOCAINE HCL (CARDIAC) PF 100 MG/5ML IV SOSY
PREFILLED_SYRINGE | INTRAVENOUS | Status: DC | PRN
  Administered 2021-04-26: 100 mg via INTRAVENOUS

## 2021-04-26 MED ORDER — ORAL CARE MOUTH RINSE: 15.0000 mL | Freq: Once | OROMUCOSAL | Status: AC

## 2021-04-26 MED ORDER — CHLORHEXIDINE GLUCONATE 0.12 % MT SOLN
OROMUCOSAL | Status: AC
  Administered 2021-04-26: 15 mL via OROMUCOSAL
  Filled 2021-04-26: qty 15

## 2021-04-26 MED ORDER — ACETAMINOPHEN 10 MG/ML IV SOLN
INTRAVENOUS | Status: AC
  Filled 2021-04-26: qty 100

## 2021-04-26 MED ORDER — LOSARTAN POTASSIUM 50 MG PO TABS
100.0000 mg | ORAL_TABLET | Freq: Every day | ORAL | Status: DC
  Administered 2021-04-27: 100 mg via ORAL
  Filled 2021-04-26: qty 2

## 2021-04-26 MED ORDER — OXYCODONE HCL 5 MG PO TABS
5.0000 mg | ORAL_TABLET | ORAL | Status: DC | PRN
Start: 2021-04-26 — End: 2021-04-27

## 2021-04-26 MED ORDER — LIDOCAINE HCL (PF) 2 % IJ SOLN
INTRAMUSCULAR | Status: AC
  Filled 2021-04-26: qty 5

## 2021-04-26 MED ORDER — ONDANSETRON HCL 4 MG/2ML IJ SOLN: 4.0000 mg | Freq: Once | INTRAMUSCULAR | Status: DC | PRN

## 2021-04-26 MED ORDER — SUCCINYLCHOLINE CHLORIDE 200 MG/10ML IV SOSY
PREFILLED_SYRINGE | INTRAVENOUS | Status: DC | PRN
  Administered 2021-04-26: 100 mg via INTRAVENOUS

## 2021-04-26 MED ORDER — KETOROLAC TROMETHAMINE 30 MG/ML IJ SOLN
INTRAMUSCULAR | Status: AC
  Filled 2021-04-26: qty 1

## 2021-04-26 MED ORDER — ACETAMINOPHEN 500 MG PO TABS
1000.0000 mg | ORAL_TABLET | Freq: Four times a day (QID) | ORAL | Status: DC
  Administered 2021-04-26 – 2021-04-27 (×3): 1000 mg via ORAL
  Filled 2021-04-26 (×4): qty 2

## 2021-04-26 MED ORDER — EPHEDRINE SULFATE 50 MG/ML IJ SOLN
INTRAMUSCULAR | Status: DC | PRN
  Administered 2021-04-26: 10 mg via INTRAVENOUS
  Administered 2021-04-26: 5 mg via INTRAVENOUS
  Administered 2021-04-26: 10 mg via INTRAVENOUS

## 2021-04-26 MED ORDER — MIDAZOLAM HCL 2 MG/2ML IJ SOLN
INTRAMUSCULAR | Status: AC
  Filled 2021-04-26: qty 2

## 2021-04-26 MED ORDER — FAMOTIDINE 20 MG PO TABS
ORAL_TABLET | ORAL | Status: AC
  Administered 2021-04-26: 20 mg via ORAL
  Filled 2021-04-26: qty 1

## 2021-04-26 MED ORDER — OXYCODONE HCL 5 MG PO TABS: 10.0000 mg | ORAL_TABLET | ORAL | Status: DC | PRN

## 2021-04-26 MED ORDER — DEXAMETHASONE SODIUM PHOSPHATE 10 MG/ML IJ SOLN
INTRAMUSCULAR | Status: AC
  Filled 2021-04-26: qty 1

## 2021-04-26 MED ORDER — SENNA 8.6 MG PO TABS
1.0000 | ORAL_TABLET | Freq: Two times a day (BID) | ORAL | Status: DC
  Administered 2021-04-26 – 2021-04-27 (×3): 8.6 mg via ORAL
  Filled 2021-04-26 (×3): qty 1

## 2021-04-26 MED ORDER — ATORVASTATIN CALCIUM 20 MG PO TABS: 40.0000 mg | ORAL_TABLET | Freq: Every day | ORAL | Status: DC

## 2021-04-26 MED ORDER — OXYBUTYNIN CHLORIDE 5 MG PO TABS
5.0000 mg | ORAL_TABLET | Freq: Two times a day (BID) | ORAL | Status: DC
  Administered 2021-04-26 – 2021-04-27 (×2): 5 mg via ORAL
  Filled 2021-04-26 (×3): qty 1

## 2021-04-26 MED ORDER — ATENOLOL 25 MG PO TABS: 25.0000 mg | ORAL_TABLET | Freq: Every day | ORAL | Status: DC

## 2021-04-26 MED ORDER — KETAMINE HCL 50 MG/ML IJ SOLN
INTRAMUSCULAR | Status: AC
  Filled 2021-04-26: qty 1

## 2021-04-26 MED ORDER — GLYCOPYRROLATE 0.2 MG/ML IJ SOLN
INTRAMUSCULAR | Status: DC | PRN
  Administered 2021-04-26: .2 mg via INTRAVENOUS

## 2021-04-26 MED ORDER — METHYLPREDNISOLONE ACETATE 40 MG/ML IJ SUSP
INTRAMUSCULAR | Status: DC | PRN
  Administered 2021-04-26: 40 mg

## 2021-04-26 MED ORDER — MONTELUKAST SODIUM 10 MG PO TABS
10.0000 mg | ORAL_TABLET | Freq: Every day | ORAL | Status: DC
  Administered 2021-04-26 – 2021-04-27 (×2): 10 mg via ORAL
  Filled 2021-04-26 (×2): qty 1

## 2021-04-26 MED ORDER — PHENYLEPHRINE HCL (PRESSORS) 10 MG/ML IV SOLN
INTRAVENOUS | Status: AC
  Filled 2021-04-26: qty 1

## 2021-04-26 MED ORDER — SODIUM CHLORIDE 0.9% FLUSH: 3.0000 mL | INTRAVENOUS | Status: DC | PRN

## 2021-04-26 MED ORDER — FAMOTIDINE 20 MG PO TABS: 20.0000 mg | ORAL_TABLET | Freq: Once | ORAL | Status: AC

## 2021-04-26 MED ORDER — FENTANYL CITRATE (PF) 100 MCG/2ML IJ SOLN: 25.0000 ug | INTRAMUSCULAR | Status: DC | PRN

## 2021-04-26 MED ORDER — VANCOMYCIN HCL IN DEXTROSE 1-5 GM/200ML-% IV SOLN
INTRAVENOUS | Status: AC
  Administered 2021-04-26: 1000 mg via INTRAVENOUS
  Filled 2021-04-26: qty 200

## 2021-04-26 MED ORDER — ATORVASTATIN CALCIUM 20 MG PO TABS
40.0000 mg | ORAL_TABLET | Freq: Every day | ORAL | Status: DC
  Administered 2021-04-27: 40 mg via ORAL
  Filled 2021-04-26: qty 2

## 2021-04-26 MED ORDER — MENTHOL 3 MG MT LOZG
1.0000 | LOZENGE | OROMUCOSAL | Status: DC | PRN
  Filled 2021-04-26: qty 9

## 2021-04-26 MED ORDER — EPHEDRINE 5 MG/ML INJ
INTRAVENOUS | Status: AC
  Filled 2021-04-26: qty 5

## 2021-04-26 MED ORDER — SODIUM CHLORIDE 0.9 % IV SOLN: INTRAVENOUS | Status: DC

## 2021-04-26 MED ORDER — LOSARTAN POTASSIUM-HCTZ 100-25 MG PO TABS: 1.0000 | ORAL_TABLET | Freq: Every day | ORAL | Status: DC

## 2021-04-26 MED ORDER — REMIFENTANIL HCL 1 MG IV SOLR
INTRAVENOUS | Status: DC | PRN
  Administered 2021-04-26: .15 ug/kg/min via INTRAVENOUS

## 2021-04-26 MED ORDER — DEXAMETHASONE SODIUM PHOSPHATE 10 MG/ML IJ SOLN
INTRAMUSCULAR | Status: DC | PRN
  Administered 2021-04-26: 10 mg via INTRAVENOUS

## 2021-04-26 MED ORDER — FLUTICASONE PROPIONATE 50 MCG/ACT NA SUSP
2.0000 | Freq: Every day | NASAL | Status: DC
  Administered 2021-04-27: 2 via NASAL
  Filled 2021-04-26: qty 16

## 2021-04-26 MED ORDER — VANCOMYCIN HCL IN DEXTROSE 1-5 GM/200ML-% IV SOLN: 1000.0000 mg | Freq: Once | INTRAVENOUS | Status: AC

## 2021-04-26 MED ORDER — METHOCARBAMOL 500 MG PO TABS
500.0000 mg | ORAL_TABLET | Freq: Four times a day (QID) | ORAL | Status: DC | PRN
  Administered 2021-04-26: 500 mg via ORAL
  Filled 2021-04-26: qty 1

## 2021-04-26 MED ORDER — REMIFENTANIL HCL 1 MG IV SOLR
INTRAVENOUS | Status: AC
  Filled 2021-04-26: qty 1000

## 2021-04-26 MED ORDER — ONDANSETRON HCL 4 MG/2ML IJ SOLN: 4.0000 mg | Freq: Four times a day (QID) | INTRAMUSCULAR | Status: DC | PRN

## 2021-04-26 MED ORDER — PROPOFOL 10 MG/ML IV BOLUS
INTRAVENOUS | Status: DC | PRN
  Administered 2021-04-26: 150 mg via INTRAVENOUS

## 2021-04-26 MED ORDER — FENTANYL CITRATE (PF) 100 MCG/2ML IJ SOLN
INTRAMUSCULAR | Status: DC | PRN
  Administered 2021-04-26 (×2): 50 ug via INTRAVENOUS

## 2021-04-26 MED ORDER — POLYETHYLENE GLYCOL 3350 17 G PO PACK: 17.0000 g | PACK | Freq: Every day | ORAL | Status: DC | PRN

## 2021-04-26 MED ORDER — PHENYLEPHRINE HCL (PRESSORS) 10 MG/ML IV SOLN
INTRAVENOUS | Status: DC | PRN
  Administered 2021-04-26: 200 ug via INTRAVENOUS
  Administered 2021-04-26 (×7): 100 ug via INTRAVENOUS
  Administered 2021-04-26: 200 ug via INTRAVENOUS
  Administered 2021-04-26 (×2): 100 ug via INTRAVENOUS

## 2021-04-26 MED ORDER — AMLODIPINE BESYLATE 5 MG PO TABS
5.0000 mg | ORAL_TABLET | Freq: Every day | ORAL | Status: DC
  Administered 2021-04-27: 5 mg via ORAL
  Filled 2021-04-26: qty 1

## 2021-04-26 MED ORDER — ONDANSETRON HCL 4 MG PO TABS: 4.0000 mg | ORAL_TABLET | Freq: Four times a day (QID) | ORAL | Status: DC | PRN

## 2021-04-26 MED ORDER — BUPIVACAINE-EPINEPHRINE (PF) 0.5% -1:200000 IJ SOLN
INTRAMUSCULAR | Status: DC | PRN
  Administered 2021-04-26: 4 mL

## 2021-04-26 MED ORDER — HEMOSTATIC AGENTS (NO CHARGE) OPTIME
TOPICAL | Status: DC | PRN
  Administered 2021-04-26: 1 via TOPICAL

## 2021-04-26 MED ORDER — ADULT MULTIVITAMIN W/MINERALS CH
1.0000 | ORAL_TABLET | Freq: Every day | ORAL | Status: DC
  Administered 2021-04-26: 1 via ORAL
  Filled 2021-04-26 (×2): qty 1

## 2021-04-26 MED ORDER — ACETAMINOPHEN 10 MG/ML IV SOLN
INTRAVENOUS | Status: DC | PRN
  Administered 2021-04-26: 1000 mg via INTRAVENOUS

## 2021-04-26 MED ORDER — ONDANSETRON HCL 4 MG/2ML IJ SOLN
INTRAMUSCULAR | Status: DC | PRN
  Administered 2021-04-26: 4 mg via INTRAVENOUS

## 2021-04-26 MED ORDER — SUCCINYLCHOLINE CHLORIDE 200 MG/10ML IV SOSY
PREFILLED_SYRINGE | INTRAVENOUS | Status: AC
  Filled 2021-04-26: qty 10

## 2021-04-26 MED ORDER — METHOCARBAMOL 1000 MG/10ML IJ SOLN
500.0000 mg | Freq: Four times a day (QID) | INTRAVENOUS | Status: DC | PRN
  Filled 2021-04-26: qty 5

## 2021-04-26 MED ORDER — DOCUSATE SODIUM 100 MG PO CAPS
100.0000 mg | ORAL_CAPSULE | Freq: Every day | ORAL | Status: DC
  Administered 2021-04-26 – 2021-04-27 (×2): 100 mg via ORAL
  Filled 2021-04-26 (×2): qty 1

## 2021-04-26 MED ORDER — CELECOXIB 200 MG PO CAPS
200.0000 mg | ORAL_CAPSULE | Freq: Two times a day (BID) | ORAL | Status: DC
  Administered 2021-04-26 – 2021-04-27 (×3): 200 mg via ORAL
  Filled 2021-04-26 (×3): qty 1

## 2021-04-26 MED ORDER — THROMBIN 5000 UNITS EX SOLR
CUTANEOUS | Status: DC | PRN
  Administered 2021-04-26: 5000 [IU] via TOPICAL

## 2021-04-26 MED ORDER — MORPHINE SULFATE (PF) 2 MG/ML IV SOLN
2.0000 mg | INTRAVENOUS | Status: AC | PRN
Start: 2021-04-26 — End: 2021-04-27

## 2021-04-26 SURGICAL SUPPLY — 55 items
BULB RESERV EVAC DRAIN JP 100C (MISCELLANEOUS) ×2 IMPLANT
BUR NEURO DRILL SOFT 3.0X3.8M (BURR) ×2 IMPLANT
CHLORAPREP W/TINT 26 (MISCELLANEOUS) ×4 IMPLANT
CNTNR SPEC 2.5X3XGRAD LEK (MISCELLANEOUS) ×1
CONT SPEC 4OZ STER OR WHT (MISCELLANEOUS) ×1
CONTAINER SPEC 2.5X3XGRAD LEK (MISCELLANEOUS) ×1 IMPLANT
COUNTER NEEDLE 20/40 LG (NEEDLE) ×2 IMPLANT
CUP MEDICINE 2OZ PLAST GRAD ST (MISCELLANEOUS) ×4 IMPLANT
DERMABOND ADVANCED (GAUZE/BANDAGES/DRESSINGS) ×1
DERMABOND ADVANCED .7 DNX12 (GAUZE/BANDAGES/DRESSINGS) ×1 IMPLANT
DRAIN JP 10F RND SILICONE (MISCELLANEOUS) ×2 IMPLANT
DRAPE C ARM PK CFD 31 SPINE (DRAPES) ×2 IMPLANT
DRAPE C-ARM 42X72 X-RAY (DRAPES) ×2 IMPLANT
DRAPE LAPAROTOMY 100X77 ABD (DRAPES) ×2 IMPLANT
DRAPE MICROSCOPE SPINE 48X150 (DRAPES) ×2 IMPLANT
DRAPE SURG 17X11 SM STRL (DRAPES) ×8 IMPLANT
DRSG OPSITE POSTOP 4X8 (GAUZE/BANDAGES/DRESSINGS) ×2 IMPLANT
DRSG TEGADERM 2-3/8X2-3/4 SM (GAUZE/BANDAGES/DRESSINGS) ×4 IMPLANT
DRSG TEGADERM 4X4.75 (GAUZE/BANDAGES/DRESSINGS) ×2 IMPLANT
ELECT CAUTERY BLADE TIP 2.5 (TIP) ×2
ELECT EZSTD 165MM 6.5IN (MISCELLANEOUS) ×2
ELECTRODE CAUTERY BLDE TIP 2.5 (TIP) ×1 IMPLANT
ELECTRODE EZSTD 165MM 6.5IN (MISCELLANEOUS) ×1 IMPLANT
GAUZE 4X4 16PLY ~~LOC~~+RFID DBL (SPONGE) ×2 IMPLANT
GLOVE SURG SYN 8.5  E (GLOVE) ×3
GLOVE SURG SYN 8.5 E (GLOVE) ×3 IMPLANT
GOWN SRG XL LVL 3 NONREINFORCE (GOWNS) ×1 IMPLANT
GOWN STRL NON-REIN TWL XL LVL3 (GOWNS) ×1
GOWN STRL REUS W/ TWL XL LVL3 (GOWN DISPOSABLE) ×1 IMPLANT
GOWN STRL REUS W/TWL XL LVL3 (GOWN DISPOSABLE) ×1
GRADUATE 1200CC STRL 31836 (MISCELLANEOUS) ×2 IMPLANT
GRAFT DURAGEN MATRIX 1WX1L (Tissue) IMPLANT
KIT SPINAL PRONEVIEW (KITS) ×2 IMPLANT
KNIFE BAYONET SHORT DISCETOMY (MISCELLANEOUS) IMPLANT
MANIFOLD NEPTUNE II (INSTRUMENTS) ×2 IMPLANT
MARKER SKIN DUAL TIP RULER LAB (MISCELLANEOUS) ×4 IMPLANT
NDL SAFETY ECLIPSE 18X1.5 (NEEDLE) IMPLANT
NEEDLE HYPO 18GX1.5 SHARP (NEEDLE)
NEEDLE HYPO 22GX1.5 SAFETY (NEEDLE) ×2 IMPLANT
NS IRRIG 1000ML POUR BTL (IV SOLUTION) ×2 IMPLANT
PACK LAMINECTOMY NEURO (CUSTOM PROCEDURE TRAY) ×2 IMPLANT
PAD ARMBOARD 7.5X6 YLW CONV (MISCELLANEOUS) ×2 IMPLANT
SPOGE SURGIFLO 8M (HEMOSTASIS) ×1
SPONGE SURGIFLO 8M (HEMOSTASIS) ×1 IMPLANT
SUT DVC VLOC 3-0 CL 6 P-12 (SUTURE) ×2 IMPLANT
SUT ETHILON 3-0 FS-10 30 BLK (SUTURE) ×2
SUT VIC AB 0 CT1 27 (SUTURE) ×1
SUT VIC AB 0 CT1 27XCR 8 STRN (SUTURE) ×1 IMPLANT
SUT VIC AB 2-0 CT1 18 (SUTURE) ×2 IMPLANT
SUTURE EHLN 3-0 FS-10 30 BLK (SUTURE) ×1 IMPLANT
SYR 20ML LL LF (SYRINGE) ×2 IMPLANT
SYR 30ML LL (SYRINGE) ×4 IMPLANT
SYR 3ML LL SCALE MARK (SYRINGE) ×2 IMPLANT
TOWEL OR 17X26 4PK STRL BLUE (TOWEL DISPOSABLE) ×6 IMPLANT
TUBING CONNECTING 10 (TUBING) ×2 IMPLANT

## 2021-04-26 NOTE — H&P (Signed)
History of Present Illness: 04/26/2021 David Jennings is a 80 yo male who presents today with continued left leg pain.  12/16/2020  David Jennings returns to see me. He has undergone physical therapy in the past 12 months. He had 2 different injections in the past several months. He still having significant issues with walking. He has primarily pain down his left leg with walking and standing. He has had to limit some of his activities  05/05/2020 David Jennings returns to see me. Since he was last here, he has been doing okay. He is still able to walk at times. His pain is not severely activity limiting. He is does still have pain down his left leg as described below. He has tried multiple injections as well as physical therapy.  10/29/19 David Jennings returns to see me. Physical therapy has been helping him. The second injection helped him significantly. He would like to avoid having surgery, so is interested in talking about the third shot.  09/10/2019 David Jennings is here today with a chief complaint of low back pain, left buttock pain, left anterior thigh pain.  He has been having symptoms since September 2020. He improved reports constant and achy pain as bad as 8 out of 10 that is worsened by standing or walking. He describes a feeling of quivering in his left anterior thigh. His symptoms are made better when he sits down. He can only walk approximately 100 feet currently. He does use a cart when he enters a grocery store. He starts having symptoms when he stands for more than a few minutes. He denies weakness. His pain is worst in the morning. He denies bowel or bladder dysfunction.  He has had a couple of injections as noted below. He has been previously referred for physical therapy but has not started.  Conservative measures:  Physical therapy: has been referred, but hasn't started Multimodal medical therapy including regular antiinflammatories: norco, gabapentin, tramadol,  prednisone Injections: has tried epidural steroid injections 08/25/19: left L2 and left L3 nerve root blocks and transforaminal epidurals at [place] (reports no relief) He is scheduled for a 2nd injection with Dr Sharlet Salina on 10/01/19  Past Surgery: lumbar fusion in 1980's  David Jennings has no symptoms of cervical myelopathy.  The symptoms are causing a significant impact on the patient's life.   Review of Systems:  A 10 point review of systems is negative, except for the pertinent positives and negatives detailed in the HPI.  Past Medical History: Past Medical History:  Diagnosis Date   Acute blood loss anemia   Asthma without status asthmaticus   Bilateral cataracts   Blood glucose elevated 05/24/2016   Cataracts, bilateral   Chicken pox   Colon polyp   Dermatitis   Diverticulosis   GERD (gastroesophageal reflux disease)   High cholesterol   Hypertension   Kidney stones   Left pontine stroke (CMS-HCC) 10/11/2017   Melanoma (CMS-HCC)  Status post facial skin removal for malignant melanomas 10/23/10   Obesity   Osteoarthritis   Pneumonia   Prostate cancer (CMS-HCC)  s/p seed implants 02/09   Pulmonary nodules  Bilateral   Sleep apnea   Total knee replacement status  bilateral, 2013   Past Surgical History: Past Surgical History:  Procedure Laterality Date   ARTHROPLASTY TOTAL ANKLE Right 05/25/2016  Procedure: ARTHROPLASTY, ANKLE; WITH IMPLANT (TOTAL ANKLE); Surgeon: Waynetta Pean, MD; Location: [place]; Service: Orthopedics; Laterality: Right;   ARTHROPLASTY TOTAL ANKLE Right 05/25/2016  Procedure: ARTHROPLASTY, ANKLE; WITH IMPLANT (TOTAL ANKLE); Surgeon: Andee Poles, MD; Location: [place]; Service: Orthopedics; Laterality: Right;   ARTHROTOMY ANKLE Right 08/16/2016  Procedure: ARTHROTOMY, ANKLE, INCLUDING EXPLORATION, DRAINAGE, OR REMOVAL OF FOREIGN BODY; Surgeon: Barbaraann Faster, MD; Location: [place]; Service: Orthopedics;  Laterality: Right;   CARDIAC CATHETERIZATION   CATARACTS REMOVED  BILATERAL EYES   COLONOSCOPY   COLONOSCOPY 6*19/2015 PYO  +TA /DIVERTICULOSIS IN [place] & DC/REPEAT 35YRS/[place]   DEBRIDEMENT SKIN/SUBCUTANEOUS TISSUE & MUSCLE ANKLE FOOT/TOE Right 01/04/2017  Procedure: DEBRIDEMENT, ANKLE/FOOT/TOE; SUBCUTANEOUS TISSUE (INCLUDES EPIDERMIS AND DERMIS, IF PERFORMED); FIRST 20 SQ CM OR LESS; Surgeon: Waynetta Pean, MD; Location: [place]; Service: Orthopedics; Laterality: Right;   EGD   INSERTION NON-BIODEGRADABLE DRUG DELIVERY IMPLANT Right 08/16/2016  Procedure: INSERTION, NON-BIODEGRADABLE DRUG DELIVERY IMPLANT; Surgeon: Barbaraann Faster, MD; Location: [place]; Service: Orthopedics; Laterality: Right;   INTRAOPERATIVE FLUOROSCOPY N/A 05/25/2016  Procedure: FLUOROSCOPY (SEPARATE PROCEDURE), UP TO 1 HOUR PHYSICIAN OR OTHER QUALIFIED HEALTH CARE PROFESSIONAL TIME, OTHER THAN [phone] OR [phone] (EG, CARDIAC FLUOROSCOPY); Surgeon: Waynetta Pean, MD; Location: [place]; Service: Orthopedics; Laterality: N/A;   JOINT REPLACEMENT Right 10/03/2011  Right total knee arthroplasty.   JOINT REPLACEMENT Left 02/18/2012  Left total knee arthroplasty using computer assisted navigation.   Lumbar spine fusion L4-L5 1980s   Prostate biopsy   REMOVAL HARDWARE ANKLE/FOOT/TOES Right 08/16/2016  Procedure: REMOVAL OF IMPLANT; ANKLE/FOOT/TOE, DEEP (EG, BURIED WIRE, PIN, SCREW, METAL BAND, NAIL, ROD OR PLATE); Surgeon: Barbaraann Faster, MD; Location: [place]; Service: Orthopedics; Laterality: Right;   SEED IMPLANTS 2009  For prostate cancer   TONSILLECTOMY   UPPER GASTROINTESTINAL ENDOSCOPY   VASECTOMY 1960   Wisdom Teeth Removed    Allergies  Allergen Reactions   Tape Rash    Band-Aid causes redness    Current Meds  Medication Sig   albuterol (VENTOLIN HFA) 108 (90 Base) MCG/ACT inhaler Inhale 2 puffs into the lungs every 6 (six) hours as needed for wheezing.   amLODipine (NORVASC) 5 MG  tablet Take 5 mg by mouth daily.   atenolol (TENORMIN) 25 MG tablet Take 25 mg by mouth daily.   atorvastatin (LIPITOR) 40 MG tablet Take 1 tablet (40 mg total) by mouth daily at 6 PM.   BREO ELLIPTA 100-25 MCG/INH AEPB Inhale 1 puff into the lungs daily.   clopidogrel (PLAVIX) 75 MG tablet Take 1 tablet (75 mg total) by mouth daily.   Docusate Calcium (STOOL SOFTENER PO) Take 1 tablet by mouth daily.   doxycycline (VIBRA-TABS) 100 MG tablet Take 100 mg by mouth daily. (Patient not taking: Reported on 04/18/2021)   fluocinonide cream (LIDEX) [id number] % Apply 1 application topically daily as needed.   fluticasone (FLONASE) 50 MCG/ACT nasal spray Place 2 sprays into both nostrils daily.   HYDROcodone-acetaminophen (NORCO) 10-325 MG tablet Take 0.5 tablets by mouth in the morning and at bedtime. As needed   HYDROMET 5-1.5 MG/5ML syrup Take 5 mLs by mouth daily as needed for cough.   ipratropium-albuterol (DUONEB) 0.5-2.5 (3) MG/3ML SOLN Inhale 3 mLs into the lungs 3 (three) times daily as needed.   losartan-hydrochlorothiazide (HYZAAR) 100-25 MG tablet Take 1 tablet by mouth daily.   montelukast (SINGULAIR) 10 MG tablet Take 10 mg by mouth daily.   Multiple Vitamin (MULTI-VITAMINS) TABS Take 1 tablet by mouth daily.   oxybutynin (DITROPAN) 5 MG tablet Take 5 mg by mouth 2 (two) times daily.   predniSONE (DELTASONE) 10 MG  tablet Take 10 mg by mouth daily. (Patient not taking: Reported on 04/18/2021)      Social History: Social History   Tobacco Use   Smoking status: Never Smoker   Smokeless tobacco: Never Used  Scientific laboratory technician Use: Never used  Substance Use Topics   Alcohol use: Not Currently  Alcohol/week: 1.0 standard drink  Types: 1 Standard drinks or equivalent per week  Comment: rare   Drug use: No   Family Medical History: Family History  Problem Relation Age of Onset   Angina Mother   Hypotension Father   Diabetes type II Maternal Grandmother   Asthma Maternal Grandfather    Anesthesia problems Neg Hx   Malignant hyperthermia Neg Hx   Physical Examination:  Vitals:   04/26/21 0627  BP: (!) 142/85  Pulse: (!) 59  Resp: 18  Temp: 98.4 F (36.9 C)  SpO2: 95%   Heart sounds normal no MRG. Chest Clear to Auscultation Bilaterally.    General: Patient is well developed, well nourished, calm, collected, and in no apparent distress. Attention to examination is appropriate.  Psychiatric: Patient is non-anxious.  Head: Pupils equal, round, and reactive to light.  ENT: Oral mucosa appears well hydrated.  Neck: Supple. Full range of motion.  Respiratory: Patient is breathing without any difficulty.  Extremities: No edema.  Vascular: Palpable dorsal pedal pulses.  Skin: On exposed skin, there are no abnormal skin lesions.  NEUROLOGICAL:   Awake, alert, oriented to person, place, and time. Speech is clear and fluent. Fund of knowledge is appropriate.   Cranial Nerves: Pupils equal round and reactive to light. Facial tone is symmetric. Facial sensation is symmetric. Shoulder shrug is symmetric. Tongue protrusion is midline. There is no pronator drift.  ROM of spine: diminished extension. Strength: Side Biceps Triceps Deltoid Interossei Grip Wrist Ext. Wrist Flex.  R '5 5 5 5 5 5 5  '$ L '5 5 5 5 5 5 5   '$ Side Iliopsoas Quads Hamstring PF DF EHL  R '5 5 5 5 5 5  '$ L '5 5 5 5 5 5   '$ Reflexes are 1+ and symmetric at the biceps, triceps, brachioradialis, patella and achilles. Hoffman's is absent. Clonus is not present. Toes are down-going.  Bilateral upper and lower extremity sensation is intact to light touch.  No evidence of dysmetria noted.  Medical Decision Making  Imaging: MRI L spine 08/12/2019 IMPRESSION: Moderate to severe spinal stenosis L2-3. Left-sided extruded disc fragment with impingement of the left L3 nerve root in the subarticular zone.  Moderate to severe spinal stenosis L3-4.  Mild to moderate spinal stenosis L4-5 with  subarticular stenosis right greater than left.  3 mm anterolisthesis L5-S1 with advanced facet degeneration.  Electronically Signed  By: Franchot Gallo M.D.  On: 08/12/2019 10:02  MRI L spine 11/22/20 IMPRESSION:  Decreased conspicuity of inferiorly migrated left L2-3 subarticular  extrusion. Moderate spinal canal and severe bilateral neural  foraminal narrowing at this level.   Severe spinal canal and bilateral neural foraminal narrowing at the  L3-4 level.   Moderate right L4-5 neural foraminal narrowing.   Electronically Signed    By: Primitivo Gauze M.D.    On: 11/22/2020 12:56  I have personally reviewed the images and agree with the above interpretation.  Assessment and Plan: Mr. Melhado is a pleasant 80 y.o. 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   We will proceed with L2-3 and L3-4 decompression with left L5-S1 foraminotomy.  Meade Maw MD, [facility] Department of Neurosurgery

## 2021-04-26 NOTE — Progress Notes (Signed)
Pt has home CPAP unit but has decided not to use it tonight. Pt aware that RT will come assist him with his unit if he changes his made.

## 2021-04-26 NOTE — Progress Notes (Signed)
Pt transferred from PACU in stable condition.  Pt oriented to room and unit.  Call bell explained and placed at the pt side

## 2021-04-26 NOTE — Anesthesia Preprocedure Evaluation (Signed)
Anesthesia Evaluation  Patient identified by MRN, date of birth, ID band Patient awake    Reviewed: Allergy & Precautions, H&P , NPO status , Patient's Chart, lab work & pertinent test results, reviewed documented beta blocker date and time   History of Anesthesia Complications Negative for: history of anesthetic complications  Airway Mallampati: I  TM Distance: >3 FB Neck ROM: full    Dental  (+) Dental Advidsory Given, Caps, Teeth Intact   Pulmonary shortness of breath (secondary to covid), asthma , sleep apnea and Continuous Positive Airway Pressure Ventilation , neg COPD, neg recent URI,    Pulmonary exam normal breath sounds clear to auscultation       Cardiovascular Exercise Tolerance: Good hypertension, (-) angina(-) Past MI and (-) Cardiac Stents Normal cardiovascular exam(-) dysrhythmias (-) Valvular Problems/Murmurs Rhythm:regular Rate:Normal     Neuro/Psych neg Seizures CVA, No Residual Symptoms negative psych ROS   GI/Hepatic negative GI ROS, Neg liver ROS,   Endo/Other  neg diabetesMorbid obesity  Renal/GU negative Renal ROS  negative genitourinary   Musculoskeletal   Abdominal   Peds  Hematology negative hematology ROS (+)   Anesthesia Other Findings Past Medical History: No date: Arthritis No date: Asthma 2010: Cancer ([place])     Comment:  prostate CA No date: COVID-19 No date: Dyspnea No date: Eczema No date: Hypertension No date: Pneumonia No date: Sleep apnea     Comment:  uses cpap No date: Stroke ([place])   Reproductive/Obstetrics negative OB ROS                             Anesthesia Physical Anesthesia Plan  ASA: 3  Anesthesia Plan: General   Post-op Pain Management:    Induction: Intravenous  PONV Risk Score and Plan: 2 and Ondansetron, Dexamethasone, Treatment may vary due to age or medical condition, TIVA and Propofol infusion  Airway Management  Planned: Oral ETT  Additional Equipment:   Intra-op Plan:   Post-operative Plan: Extubation in OR  Informed Consent: I have reviewed the patients History and Physical, chart, labs and discussed the procedure including the risks, benefits and alternatives for the proposed anesthesia with the patient or authorized representative who has indicated his/her understanding and acceptance.     Dental Advisory Given  Plan Discussed with: Anesthesiologist, CRNA and Surgeon  Anesthesia Plan Comments:         Anesthesia Quick Evaluation

## 2021-04-26 NOTE — Transfer of Care (Signed)
Immediate Anesthesia Transfer of Care Note  Patient: David Jennings  Procedure(s) Performed: L2-3 & L3-4 DECOMPRESSION; LEFT L5-S1 FORAMINOTOMY  Patient Location: PACU  Anesthesia Type:General  Level of Consciousness: drowsy  Airway & Oxygen Therapy: Patient Spontanous Breathing and Patient connected to face mask oxygen  Post-op Assessment: Report given to RN and Post -op Vital signs reviewed and stable  Post vital signs: Reviewed and stable  Last Vitals:  Vitals Value Taken Time  BP 129/78 04/26/21 1030  Temp 36 C 04/26/21 1028  Pulse 62 04/26/21 1030  Resp 14 04/26/21 1030  SpO2 98 % 04/26/21 1030  Vitals shown include unvalidated device data.  Last Pain:  Vitals:   04/26/21 0627  TempSrc: Oral  PainSc: 0-No pain         Complications: No notable events documented.

## 2021-04-26 NOTE — Op Note (Signed)
Indications: David Jennings is a 80 yo male who presented with neurogenic claudication and lumbar radiculopathy.  He failed conservative management prompting surgical intervention.  Findings: severe stenosis  Preoperative Diagnosis: Lumbar Stenosis with neurogenic claudication. Lumbar radiculopathy Postoperative Diagnosis: same   EBL: 50 ml IVF: see AR ml Drains: 1 placed Disposition: Extubated and Stable to PACU Complications: none  No foley catheter was placed.   Preoperative Note:   Risks of surgery discussed include: infection, bleeding, stroke, coma, death, paralysis, CSF leak, nerve/spinal cord injury, numbness, tingling, weakness, complex regional pain syndrome, recurrent stenosis and/or disc herniation, vascular injury, development of instability, neck/back pain, need for further surgery, persistent symptoms, development of deformity, and the risks of anesthesia. The patient understood these risks and agreed to proceed.  Operative Note:   1. L2-4 lumbar decompression including central laminectomy and bilateral medial facetectomies including foraminotomies 2. Left L5-S1 laminoforaminotomy  The patient was then brought from the preoperative center with intravenous access established.  The patient underwent general anesthesia and endotracheal tube intubation, and was then rotated on the Leith rail top where all pressure points were appropriately padded.  The skin was then thoroughly cleansed.  Perioperative antibiotic prophylaxis was administered.  Sterile prep and drapes were then applied and a timeout was then observed.  C-arm was brought into the field under sterile conditions and under lateral visualization the L5-S1 interspace was identified and marked.  The incision was marked on the midline and injected with local anesthetic. Once this was complete a 6 cm incision was opened with the use of a #10 blade knife.    The metrx tubes were sequentially advanced and confirmed in  position at L5-S1. An 72m by 773mtube was locked in place to the bed side attachment.  The microscope was then sterilely brought into the field and muscle creep was hemostased with a bipolar and resected with a pituitary rongeur.  A Bovie extender was then used to expose the spinous process and lamina.  Careful attention was placed to not violate the facet capsule. A 3 mm matchstick drill bit was then used to make a hemi-laminotomy trough until the ligamentum flavum was exposed.  This was extended to the base of the spinous process and to the contralateral side to remove all the central bone from each side.  Once this was complete and the underlying ligamentum flavum was visualized, it was dissected with a curette and resected with Kerrison rongeurs.  Extensive ligamentum hypertrophy was noted, requiring a substantial amount of time and care for removal.  The dura was identified and palpated. The kerrison rongeur was then used to remove the medial facet bilaterally until no compression was noted.  A balltip probe was used to confirm decompression of the ipsilateral S1 nerve root.  No CSF leak was noted.  A Depo-Medrol soaked Gelfoam pledget was placed in the defect.  The wound was copiously irrigated. The tube system was then removed under microscopic visualization and hemostasis was obtained with a bipolar.    After performing the decompression at L5-S1, the metrx tubes were sequentially advanced and confirmed in position at L3-4. An 1845my 35m88mbe was locked in place to the bed side attachment.  Fluoroscopy was then removed from the field.  The microscope was then sterilely brought into the field and muscle creep was hemostased with a bipolar and resected with a pituitary rongeur.  A Bovie extender was then used to expose the spinous process and lamina.  Careful attention was placed to  not violate the facet capsule. A 3 mm matchstick drill bit was then used to make a hemi-laminotomy trough until the  ligamentum flavum was exposed.  This was extended to the base of the spinous process and to the contralateral side to remove all the central bone from each side.  Once this was complete and the underlying ligamentum flavum was visualized, it was dissected with a curette and resected with Kerrison rongeurs.  Extensive ligamentum hypertrophy was noted, requiring a substantial amount of time and care for removal.  The dura was identified and palpated. The kerrison rongeur was then used to remove the medial facet bilaterally until no compression was noted.  A balltip probe was used to confirm decompression of the ipsilateral L4 nerve root.  Additional attention was paid to completion of the contralateral foraminotomy until the contralateral L4 nerve root was completely free.  Once this was complete, L3-4 central decompression including medial facetectomy and foraminotomy was confirmed and decompression on both sides was confirmed. No CSF leak was noted.  A Depo-Medrol soaked Gelfoam pledget was placed in the defect.  The wound was copiously irrigated. The tube system was then removed under microscopic visualization and hemostasis was obtained with a bipolar.   After performing the decompression at L3-4, the metrx tubes were sequentially advanced and confirmed in position at L2-3. An 60m by 750mtube was locked in place to the bed side attachment.  Fluoroscopy was then removed from the field.  The microscope was then sterilely brought into the field and muscle creep was hemostased with a bipolar and resected with a pituitary rongeur.  A Bovie extender was then used to expose the spinous process and lamina.  Careful attention was placed to not violate the facet capsule. A 3 mm matchstick drill bit was then used to make a hemi-laminotomy trough until the ligamentum flavum was exposed.  This was extended to the base of the spinous process and to the contralateral side to remove all the central bone from each side.   Once this was complete and the underlying ligamentum flavum was visualized, it was dissected with a curette and resected with Kerrison rongeurs.  Extensive ligamentum hypertrophy was noted, requiring a substantial amount of time and care for removal.  The dura was identified and palpated. The kerrison rongeur was then used to remove the medial facet bilaterally until no compression was noted.  A balltip probe was used to confirm decompression of the ipsilateral L3 nerve root.  Additional attention was paid to completion of the contralateral foraminotomy until the contralateral L3 nerve root was completely free.  Once this was complete, L2-3 central decompression including medial facetectomy and foraminotomy was confirmed and decompression on both sides was confirmed. No CSF leak was noted.  A Depo-Medrol soaked Gelfoam pledget was placed in the defect.  The wound was copiously irrigated. The tube system was then removed under microscopic visualization and hemostasis was obtained with a bipolar.    A subfascial drain was placed.    The fascial layer was reapproximated with the use of a 0 Vicryl suture.  Subcutaneous tissue layer was reapproximated using 2-0 Vicryl suture.  3-0 monocryl was placed in subcuticular fashion. The skin was then cleansed and Dermabond was used to close the skin opening.  Patient was then rotated back to the preoperative bed awakened from anesthesia and taken to recovery all counts are correct in this case.  I performed the entire procedure with the assistance of DaCooper RenderA as an asEnvironmental consultant  Psychologist, sport and exercise.  Kainoa Swoboda K. Izora Ribas MD

## 2021-04-26 NOTE — Anesthesia Postprocedure Evaluation (Signed)
Anesthesia Post Note  Patient: David Jennings  Procedure(s) Performed: L2-3 & L3-4 DECOMPRESSION; LEFT L5-S1 FORAMINOTOMY  Patient location during evaluation: PACU Anesthesia Type: General Level of consciousness: awake and alert and oriented Pain management: pain level controlled Vital Signs Assessment: post-procedure vital signs reviewed and stable Respiratory status: spontaneous breathing, nonlabored ventilation and respiratory function stable Cardiovascular status: blood pressure returned to baseline and stable Postop Assessment: no signs of nausea or vomiting Anesthetic complications: no   No notable events documented.   Last Vitals:  Vitals:   04/26/21 1130 04/26/21 1144  BP: 115/70 121/66  Pulse: (!) 51 97  Resp: 14 (!) 22  Temp:  36.5 C  SpO2: 91% 94%    Last Pain:  Vitals:   04/26/21 1130  TempSrc:   PainSc: 0-No pain                 Zaide Kardell

## 2021-04-26 NOTE — Anesthesia Procedure Notes (Signed)
Procedure Name: Intubation Date/Time: 04/26/2021 7:31 AM Performed by: Debe Coder, CRNA Pre-anesthesia Checklist: Patient identified, Emergency Drugs available, Suction available and Patient being monitored Patient Re-evaluated:Patient Re-evaluated prior to induction Oxygen Delivery Method: Circle system utilized Preoxygenation: Pre-oxygenation with 100% oxygen Induction Type: IV induction Ventilation: Mask ventilation without difficulty Laryngoscope Size: Mac and 3 Grade View: Grade I Tube type: Oral Tube size: 7.5 mm Number of attempts: 1 Airway Equipment and Method: Stylet Placement Confirmation: ETT inserted through vocal cords under direct vision, positive ETCO2 and breath sounds checked- equal and bilateral Secured at: 22 cm Tube secured with: Tape Dental Injury: Teeth and Oropharynx as per pre-operative assessment

## 2021-04-27 DIAGNOSIS — M48062 Spinal stenosis, lumbar region with neurogenic claudication: Secondary | ICD-10-CM | POA: Diagnosis not present

## 2021-04-27 MED ORDER — LOSARTAN POTASSIUM 100 MG PO TABS: 100.0000 mg | ORAL_TABLET | Freq: Every day | ORAL | Status: DC

## 2021-04-27 MED ORDER — MUPIROCIN 2 % EX OINT
TOPICAL_OINTMENT | Freq: Two times a day (BID) | CUTANEOUS | Status: DC
  Filled 2021-04-27: qty 22

## 2021-04-27 MED ORDER — CELECOXIB 200 MG PO CAPS: 200.0000 mg | ORAL_CAPSULE | Freq: Two times a day (BID) | ORAL | 0 refills | Status: DC

## 2021-04-27 MED ORDER — METHOCARBAMOL 500 MG PO TABS: 500.0000 mg | ORAL_TABLET | Freq: Four times a day (QID) | ORAL | 0 refills | Status: DC | PRN

## 2021-04-27 MED ORDER — ATORVASTATIN CALCIUM 40 MG PO TABS: 40.0000 mg | ORAL_TABLET | Freq: Every day | ORAL | Status: AC

## 2021-04-27 MED ORDER — ACETAMINOPHEN 500 MG PO TABS: 1000.0000 mg | ORAL_TABLET | Freq: Four times a day (QID) | ORAL | 0 refills | Status: DC

## 2021-04-27 MED ORDER — SENNA 8.6 MG PO TABS: 1.0000 | ORAL_TABLET | Freq: Two times a day (BID) | ORAL | 0 refills | Status: DC

## 2021-04-27 MED ORDER — HYDROCHLOROTHIAZIDE 25 MG PO TABS
25.0000 mg | ORAL_TABLET | Freq: Every day | ORAL | Status: DC
Start: 2021-04-28 — End: 2022-02-21

## 2021-04-27 MED ORDER — OXYCODONE HCL 5 MG PO TABS: 5.0000 mg | ORAL_TABLET | Freq: Four times a day (QID) | ORAL | 0 refills | Status: AC | PRN

## 2021-04-27 MED ORDER — ADULT MULTIVITAMIN W/MINERALS CH: 1.0000 | ORAL_TABLET | Freq: Every day | ORAL | Status: AC

## 2021-04-27 NOTE — Evaluation (Signed)
Occupational Therapy Evaluation Patient Details Name: David Jennings MRN: [id number] DOB: 03/13/1941 Today's Date: 04/27/2021    History of Present Illness David Jennings is an 85yoM who comes to [facility] on 04/26/21 for elective lumbar spine surgery s/p persistent Left Leg pain >33m PMH: OSA on CPAP, Rt total ankle arthroplasty 2017, Bilat TKA 2013. Pt is now s/p Lumbar decompression (central laminectomy, bilateral facetectomies, foraminoties) Lumbar-two-through-lumbar-four, Left laminoforaminotomy Lumbar-five-Sacral-one. Orders indicate "no brace needed."   Clinical Impression   Pt seen for OT evaluation this date, POD#1 from lumbar surgury above. Prior to hospital admission, pt was independent with mobility, ADL, and IADL, however experiencing pain in BLE. [facility] His spouse is able to provide 24/7 assist/support as needed for pt. Currently pt is modified independent with ADL mobility, requires PRN Min A for LB ADL tasks 2/2 back precautions. Pt educated in back precautions with handout provided, self care skills, bed mobility and functional transfer training, AE/DME for bathing, dressing, and toileting needs, car transfers, ergonomics/set up for desk and chair, compression stockings, and home/routines modifications and falls prevention strategies to maximize safety and functional independence while minimizing falls risk and maintaining precautions. Handout provided to maximize recall and carryover. Pt verbalized understanding of all education/training provided. No additional skilled OT needs at this time. Will discharge in house. Upon hospital discharge, pt safe to discharge home.      Follow Up Recommendations  No OT follow up    Equipment Recommendations  Other (comment) (reacher)    Recommendations for Other Services       Precautions / Restrictions Precautions Precautions: Fall;Back Precaution Booklet Issued: Yes (comment) Restrictions Weight  Bearing Restrictions: No      Mobility Bed Mobility Overal bed mobility: Modified Independent                  Transfers Overall transfer level: Independent               General transfer comment: educated in technique to minmize bending    Balance Overall balance assessment: No apparent balance deficits (not formally assessed)                                         ADL either performed or assessed with clinical judgement   ADL Overall ADL's : Needs assistance/impaired                                       General ADL Comments: PRN MIN A from spouse for LB ADL 2/2 back precautions, has reacher he can use at home.     Vision Baseline Vision/History: Wears glasses Wears Glasses: At all times Patient Visual Report: No change from baseline       Perception     Praxis      Pertinent Vitals/Pain Pain Assessment: No/denies pain     Hand Dominance Right   Extremity/Trunk Assessment Upper Extremity Assessment Upper Extremity Assessment: Overall WFL for tasks assessed   Lower Extremity Assessment Lower Extremity Assessment: Overall WFL for tasks assessed   Cervical / Trunk Assessment Cervical / Trunk Assessment: Other exceptions Cervical / Trunk Exceptions: s/p lumbar surgery   Communication Communication Communication: No difficulties   Cognition Arousal/Alertness: Awake/alert Behavior During Therapy: WFL for tasks assessed/performed Overall Cognitive Status:  Within Functional Limits for tasks assessed                                     General Comments  RN in at end of session to rebandage small skin tear on L forearm    Exercises Other Exercises Other Exercises: Pt educated in back precautions and how to maintain during ADL/IADL and bed mobility. Educated in falls prevention, AE/DME for ADL, car transfers, ergonomics/set up for desk and chair, and compression stockings. Handout provided to  maximize recall and carryover.   Shoulder Instructions      Home Living Family/patient expects to be discharged to:: Private residence Living Arrangements: Spouse/significant other Available Help at Discharge: Family Type of Home:  ([place]age at [facility]) Home Access: Level entry     [place]: [place]: Environmental consultant - 2 wheels;Cane - single point;Bedside commode;Grab bars - tub/shower;Hand held shower head;Adaptive equipment Adaptive Equipment: Reacher        Prior Functioning/Environment Level of Independence: Independent        Comments: Has RW from remote TKA        OT Problem List: Decreased range of motion      OT Treatment/Interventions:      OT Goals(Current goals can be found in the care plan section) Acute Rehab OT Goals Patient Stated Goal: go home and recover OT Goal Formulation: All assessment and education complete, DC therapy  OT Frequency:     Barriers to D/C:            Co-evaluation              AM-PAC OT "6 Clicks" Daily Activity     Outcome Measure Help from another person eating meals?: None Help from another person taking care of personal grooming?: None Help from another person toileting, which includes using toliet, bedpan, or urinal?: None Help from another person bathing (including washing, rinsing, drying)?: A Little Help from another person to put on and taking off regular upper body clothing?: None Help from another person to put on and taking off regular lower body clothing?: A Little 6 Click Score: 22   End of Session    Activity Tolerance: Patient tolerated treatment well Patient left: in chair;with nursing/sitter in room  OT Visit Diagnosis: Other abnormalities of gait and mobility (R26.89)                [id number] OT Time Calculation (min): 36 min Charges:  OT General Charges $OT Visit: 1 Visit OT Evaluation $OT Eval Low Complexity: 1 Low OT  Treatments $Self Care/Home Management : 23-37 mins  Hanley Hays, MPH, MS, OTR/L ascom [phone] 04/27/21, 10:44 AM

## 2021-04-27 NOTE — Evaluation (Signed)
Physical Therapy Evaluation Patient Details Name: David Jennings MRN: [id number] DOB: [birth year] Today's Date: 04/27/2021   History of Present Illness  David Jennings is an 25yoM who comes to [facility] on 04/26/21 for elective lumbar spine surgery s/p persistent Left Leg pain >48m PMH: OSA on CPAP, Rt total ankle arthroplasty 2017, Bilat TKA 2013. Pt is now s/p Lumbar decompression (central laminectomy, bilateral facetectomies, foraminoties) Lumbar-two-through-lumbar-four, Left laminoforaminotomy Lumbar-five-Sacral-one. Orders indicate "no brace needed."  Clinical Impression  Pt admitted with above diagnosis. Pt currently with functional limitations due to the deficits listed below (see "PT Problem List"). Upon entry, pt in bed, awake and agreeable to participate, already AMB with RW out in hallway, denies any pain, reports improved LLE radicular pain. Pt performing all mobility with modified independence, no difficulty, no needed assistance, no safety concerns, moving well with safe RW use. Pt ready for DC from PT standpoint- do no anticipate any need for OPPT services at this time.      Follow Up Recommendations Follow surgeon's recommendation for DC plan and follow-up therapies    Equipment Recommendations  None recommended by PT    Recommendations for Other Services       Precautions / Restrictions Precautions Precautions: Fall;Back Precaution Booklet Issued: No Restrictions Weight Bearing Restrictions: No      Mobility  Bed Mobility               General bed mobility comments: walking in hallway upon arrival    Transfers Overall transfer level: Modified independent Equipment used: Rolling walker (2 wheeled);None                Ambulation/Gait Ambulation/Gait assistance: Modified independent (Device/Increase time) Gait Distance (Feet): 275 Feet Assistive device: Rolling walker (2 wheeled) Gait Pattern/deviations: WFL(Within Functional Limits) Gait velocity:  0.955m   General Gait Details: already AMB around unit upon arrival, no pain.  Stairs            Wheelchair Mobility    Modified Rankin (Stroke Patients Only)       Balance                                             Pertinent Vitals/Pain Pain Assessment: No/denies pain    Home Living Family/patient expects to be discharged to:: Private residence Living Arrangements: Spouse/significant other Available Help at Discharge: Family Type of Home: House Home Access: Level entry     [place] consultant 2 wheels;Cane - single point;Bedside commode;Grab bars - tub/shower;Hand held shower head;Adaptive equipment      Prior Function Level of Independence: Independent         Comments: Has RW from remote TKA     Hand Dominance   Dominant Hand: Right    Extremity/Trunk Assessment                Communication   Communication: No difficulties  Cognition                                              General Comments      Exercises Other Exercises Other Exercises: Pt educated in back precautions and how to maintain during ADL/IADL and bed mobility. Educated in falls prevention, AE/DME for ADL, car  transfers, ergonomics/set up for desk and chair, and compression stockings. Handout provided to maximize recall and carryover.   Assessment/Plan    PT Assessment Patent does not need any further PT services  PT Problem List         PT Treatment Interventions      PT Goals (Current goals can be found in the Care Plan section)  Acute Rehab PT Goals Patient Stated Goal: go home and recover PT Goal Formulation: With patient Time For Goal Achievement: 05/11/21 Potential to Achieve Goals: Good    Frequency     Barriers to discharge        Co-evaluation               AM-PAC PT "6 Clicks" Mobility  Outcome Measure Help needed turning from your back to your side while in a flat bed  without using bedrails?: None Help needed moving from lying on your back to sitting on the side of a flat bed without using bedrails?: None Help needed moving to and from a bed to a chair (including a wheelchair)?: None Help needed standing up from a chair using your arms (e.g., wheelchair or bedside chair)?: None Help needed to walk in hospital room?: A Little Help needed climbing 3-5 steps with a railing? : A Little 6 Click Score: 22    End of Session   Activity Tolerance: Patient tolerated treatment well;No increased pain Patient left: in chair;with call bell/phone within reach Nurse Communication: Mobility status PT Visit Diagnosis: Difficulty in walking, not elsewhere classified (R26.2);Other abnormalities of gait and mobility (R26.89)    Time: [id number] PT Time Calculation (min) (ACUTE ONLY): 18 min   Charges:   PT Evaluation $PT Eval High Complexity: 1 High         2:39 PM, 04/27/21 Etta Grandchild, PT, DPT Physical Therapist - [facility]  [phone] ([place])    [place] 04/27/2021, 2:38 PM

## 2021-04-27 NOTE — Progress Notes (Signed)
  Progress Note  History: David Jennings is POD#1 from L2-4 lumbar decompression including central laminectomy and bilateral medial facetectomies including foraminotomies. Left L5-S1 laminoforaminotomy. He reports good pain control overnight and resolution thus far of his pre-op leg pain.  Physical Exam: Vitals:   04/26/21 2227 04/27/21 0433  BP: 140/76 127/71  Pulse: 80 80  Resp: 18 18  Temp: 98.3 F (36.8 C) 98 F (36.7 C)  SpO2: 93% 96%    AA Ox3 CNI Strength:5/5 throughout Incision c/d/I with dressing in place Drain out 47.5 since surgery.   Data:  No results for input(s): NA, K, CL, CO2, BUN, CREATININE, LABGLOM, GLUCOSE, CALCIUM in the last 168 hours. No results for input(s): AST, ALT, ALKPHOS in the last 168 hours.  Invalid input(s): TBILI   No results for input(s): WBC, HGB, HCT, PLT in the last 168 hours. No results for input(s): APTT, INR in the last 168 hours.       Assessment/Plan:  David Jennings is a 80 y.o male POD#1 from L2-4 lumbar decompression including central laminectomy and bilateral medial facetectomies including foraminotomies. Left L5-S1 laminoforaminotomy. He reports overall improvement since surgery yesterday   - drain removed this morning and dressing placed over drain site. - mobilize - pain control: requiring little pain medication overnight  - DVT prophylaxis: SCDs only  - dispo pending PT evaluation   Cooper Render PA-C Department of Neurosurgery

## 2021-04-27 NOTE — Discharge Instructions (Addendum)
Your surgeon has performed an operation on your lumbar spine (low back) to relieve pressure on one or more nerves. Many times, patients feel better immediately after surgery and can "overdo it." Even if you feel well, it is important that you follow these activity guidelines. If you do not let your back heal properly from the surgery, you can increase the chance of a disc herniation and/or return of your symptoms. The following are instructions to help in your recovery once you have been discharged from the hospital.  * Do not take anti-inflammatory medications for 3 days after surgery (naproxen [Aleve], ibuprofen [Advil, Motrin], etc.)  *You resume your home Plavix 7 days after surgery.    Activity    No bending, lifting, or twisting ("BLT"). Avoid lifting objects heavier than 10 pounds (gallon milk jug).  Where possible, avoid household activities that involve lifting, bending, pushing, or pulling such as laundry, vacuuming, grocery shopping, and childcare. Try to arrange for help from friends and family for these activities while your back heals.  Increase physical activity slowly as tolerated.  Taking short walks is encouraged, but avoid strenuous exercise. Do not jog, run, bicycle, lift weights, or participate in any other exercises unless specifically allowed by your doctor. Avoid prolonged sitting, including car rides.  Talk to your doctor before resuming sexual activity.  You should not drive until cleared by your doctor.  Until released by your doctor, you should not return to work or school.  You should rest at home and let your body heal.   You may shower two days after your surgery.  After showering, lightly dab your incision dry. Do not take a tub bath or go swimming for 3 weeks, or until approved by your doctor at your follow-up appointment.  If you smoke, we strongly recommend that you quit.  Smoking has been proven to interfere with normal healing in your back and will  dramatically reduce the success rate of your surgery. Please contact [facility] (800-QUIT-NOW) and use the resources at www.[facility].com for assistance in stopping smoking.  Surgical Incision   If you have a dressing on your incision, you may remove it three days after your surgery. Keep your incision area clean and dry.  You do not have staples or stitches, you will have steri-strips (small pieces of surgical tape) or Dermabond glue. The steri-strips/glue should begin to peel away within about a week (it is fine if the steri-strips fall off before then). If the strips are still in place one week after your surgery, you may gently remove them.  Diet            You may return to your usual diet. Be sure to stay hydrated.  When to Contact us  Although your surgery and recovery will likely be uneventful, you may have some residual numbness, aches, and pains in your back and/or legs. This is normal and should improve in the next few weeks.  However, should you experience any of the following, contact us immediately: New numbness or weakness Pain that is progressively getting worse, and is not relieved by your pain medications or rest Bleeding, redness, swelling, pain, or drainage from surgical incision Chills or flu-like symptoms Fever greater than 101.0 F (38.3 C) Problems with bowel or bladder functions Difficulty breathing or shortness of breath Warmth, tenderness, or swelling in your calf  Contact Information During office hours (Monday-Friday 9 am to 5 pm), please call your physician at [phone] After hours and weekends, please call [phone]  and speak with the answering service, who will contact the doctor on call.  If that fails, call the [place] Operator at [phone] and ask for the Neurosurgery Resident On Call  For a life-threatening emergency, call 911

## 2021-04-27 NOTE — Plan of Care (Signed)
Pt denies pain this shift. Expresses concern on how he is going to be able to take care of his wound at home. He is also very anxious about getting back on his home meds. Pt reassured that doctor will answer all his questions today and his home medications will be addressed. Problem: Education: Goal: Knowledge of General Education information will improve Description: Including pain rating scale, medication(s)/side effects and non-pharmacologic comfort measures Outcome: Progressing   Problem: Health Behavior/Discharge Planning: Goal: Ability to manage health-related needs will improve Outcome: Progressing   Problem: Clinical Measurements: Goal: Ability to maintain clinical measurements within normal limits will improve Outcome: Progressing Goal: Will remain free from infection Outcome: Progressing Goal: Respiratory complications will improve Outcome: Progressing Goal: Cardiovascular complication will be avoided Outcome: Progressing   Problem: Activity: Goal: Risk for activity intolerance will decrease Outcome: Progressing   Problem: Nutrition: Goal: Adequate nutrition will be maintained Outcome: Progressing   Problem: Pain Managment: Goal: General experience of comfort will improve Outcome: Progressing   Problem: Safety: Goal: Ability to remain free from injury will improve Outcome: Progressing   Problem: Skin Integrity: Goal: Risk for impaired skin integrity will decrease Outcome: Progressing

## 2021-04-27 NOTE — Discharge Summary (Signed)
Physician Discharge Summary  Patient ID: David Jennings MRN: [id number] DOB/AGE: 1941-06-07 80 y.o.  Admit date: 04/26/2021 Discharge date: 04/27/2021  Admission Diagnoses: Neurogenic Claudication and lumbar radiculopathy  Procedure: 04/26/21 L2-3, L3-4 decompression and L L5-S1 foraminotomy   Discharge Diagnoses:  Active Problems:   Neurogenic claudication [facility])   Discharged Condition: good  Hospital Course: David Jennings is an 80 y.o admitted for the above surgical procedure. His hospital course has been uncomplicated.  He was seen and evaluated by PT and OT who recommended discharge home without any additional therapeutic needs.  Consults: None  Significant Diagnostic Studies:   Treatments: IV hydration and therapies: PT and OT  Discharge Exam: Blood pressure 117/72, pulse 70, temperature 98.3 F (36.8 C), resp. rate 18, height '5\' 8"'$  (1.727 m), weight 119.7 kg, SpO2 96 %. Recent Results (from the past 2160 hour(s))  APTT     Status: None   Collection Time: 04/18/21  9:52 AM  Result Value Ref Range   aPTT 32 24 - 36 seconds    Comment: Performed at [facility], [address] [place], [place] [id number]  Basic metabolic panel     Status: None   Collection Time: 04/18/21  9:52 AM  Result Value Ref Range   Sodium 139 135 - 145 mmol/L   Potassium 3.5 3.5 - 5.1 mmol/L   Chloride 102 98 - 111 mmol/L   CO2 28 22 - 32 mmol/L   Glucose, Bld 93 70 - 99 mg/dL    Comment: Glucose reference range applies only to samples taken after fasting for at least 8 hours.   BUN 18 8 - 23 mg/dL   Creatinine, Ser 0.71 0.61 - 1.24 mg/dL   Calcium 9.1 8.9 - 10.3 mg/dL   GFR, Estimated >60 >60 mL/min    Comment: (NOTE) Calculated using the CKD-EPI Creatinine Equation (2021)    Anion gap 9 5 - 15    Comment: Performed at [facility], [address] [place], [place] [address]  CBC     Status: None   Collection Time: 04/18/21  9:52 AM  Result Value Ref Range   WBC 6.3  4.0 - 10.5 K/uL   RBC 4.88 4.22 - 5.81 MIL/uL   Hemoglobin 15.4 13.0 - 17.0 g/dL   HCT 45.3 39.0 - 52.0 %   MCV 92.8 80.0 - 100.0 fL   MCH 31.6 26.0 - 34.0 pg   MCHC 34.0 30.0 - 36.0 g/dL   RDW 12.7 11.5 - 15.5 %   Platelets 216 150 - 400 K/uL   nRBC 0.0 0.0 - 0.2 %    Comment: Performed at [facility], [address] [place], [place] [address]  Protime-INR     Status: None   Collection Time: 04/18/21  9:52 AM  Result Value Ref Range   Prothrombin Time 13.6 11.4 - 15.2 seconds   INR 1.0 0.8 - 1.2    Comment: (NOTE) INR goal varies based on device and disease states. Performed at [facility], [address], [place], [place] [address]   Surgical pcr screen     Status: Abnormal   Collection Time: 04/18/21  9:52 AM   Specimen: Nasal Mucosa; Nasal Swab  Result Value Ref Range   MRSA, PCR POSITIVE (A) NEGATIVE    Comment: BRIAN GRAY AT 1325 ON 04/18/21 BY [facility] RESULT CALLED TO, READ BACK BY AND VERIFIED WITH:    Staphylococcus aureus POSITIVE (A) NEGATIVE    Comment: (NOTE) The Xpert  SA Assay (FDA approved for NASAL specimens in patients 64 years of age and older), is one component of a comprehensive surveillance program. It is not intended to diagnose infection nor to guide or monitor treatment. Performed at [facility], [address] [place], [place] [address]   Type and screen     Status: None   Collection Time: 04/18/21  9:52 AM  Result Value Ref Range   ABO/RH(D) AB POS    Antibody Screen NEG    Sample Expiration 05/02/2021,2359    Extend sample reason      NO TRANSFUSIONS OR PREGNANCY IN THE PAST 3 MONTHS Performed at [facility], [address] [place], [place] [address]   Urinalysis, Routine w reflex microscopic     Status: Abnormal   Collection Time: 04/18/21  9:52 AM  Result Value Ref Range   Color, Urine YELLOW (A) YELLOW   APPearance CLEAR (A) CLEAR   Specific Gravity, Urine 1.012 1.005 - 1.030   pH 7.0 5.0 -  8.0   Glucose, UA NEGATIVE NEGATIVE mg/dL   Hgb urine dipstick SMALL (A) NEGATIVE   Bilirubin Urine NEGATIVE NEGATIVE   Ketones, ur NEGATIVE NEGATIVE mg/dL   Protein, ur NEGATIVE NEGATIVE mg/dL   Nitrite NEGATIVE NEGATIVE   Leukocytes,Ua NEGATIVE NEGATIVE   RBC / HPF 11-20 0 - 5 RBC/hpf   WBC, UA 0-5 0 - 5 WBC/hpf   Bacteria, UA RARE (A) NONE SEEN   Squamous Epithelial / LPF NONE SEEN 0 - 5   Mucus PRESENT     Comment: Performed at [facility], [address], [place], [place] [address]  ABO/Rh     Status: None   Collection Time: 04/26/21  6:26 AM  Result Value Ref Range   ABO/RH(D)      AB POS Performed at [facility], [address] [place], [place] [address]     General appearance: alert, cooperative, and no distress CNI Incision c/di  Strength: 5/5 throughout BLE  Disposition: Discharge disposition: 01-Home or Self Care      Discharge Instructions     Diet - low sodium heart healthy   Complete by: As directed    Incentive spirometry RT   Complete by: As directed    Increase activity slowly   Complete by: As directed    Remove dressing in 24 hours   Complete by: As directed       Allergies as of 04/27/2021       Reactions   Tape Rash   Band-Aid causes redness        Medication List     STOP taking these medications    clopidogrel 75 MG tablet Commonly known as: PLAVIX   doxycycline 100 MG tablet Commonly known as: VIBRA-TABS   predniSONE 10 MG tablet Commonly known as: DELTASONE       TAKE these medications    acetaminophen 500 MG tablet Commonly known as: TYLENOL Take 2 tablets (1,000 mg total) by mouth every 6 (six) hours.   albuterol 108 (90 Base) MCG/ACT inhaler Commonly known as: VENTOLIN HFA Inhale 2 puffs into the lungs every 6 (six) hours as needed for wheezing.   amLODipine 5 MG tablet Commonly known as: NORVASC Take 5 mg by mouth daily.   atenolol 25 MG tablet Commonly known as: TENORMIN Take  25 mg by mouth daily.   atorvastatin 40 MG tablet Commonly known as: LIPITOR Take 1 tablet (40 mg total) by mouth daily at 6 PM.  What changed: Another medication with the same name was added. Make sure you understand how and when to take each.   atorvastatin 40 MG tablet Commonly known as: LIPITOR Take 1 tablet (40 mg total) by mouth daily. Start taking on: April 28, 2021 What changed: You were already taking a medication with the same name, and this prescription was added. Make sure you understand how and when to take each.   Breo Ellipta 100-25 MCG/INH Aepb Generic drug: fluticasone furoate-vilanterol Inhale 1 puff into the lungs daily.   celecoxib 200 MG capsule Commonly known as: CELEBREX Take 1 capsule (200 mg total) by mouth every 12 (twelve) hours.   fluocinonide cream 0.05 % Commonly known as: LIDEX Apply 1 application topically daily as needed.   fluticasone 50 MCG/ACT nasal spray Commonly known as: FLONASE Place 2 sprays into both nostrils daily.   hydrochlorothiazide 25 MG tablet Commonly known as: HYDRODIURIL Take 1 tablet (25 mg total) by mouth daily. Start taking on: April 28, 2021   HYDROcodone-acetaminophen 10-325 MG tablet Commonly known as: NORCO Take 0.5 tablets by mouth in the morning and at bedtime. As needed   Hydromet 5-1.5 MG/5ML syrup Generic drug: HYDROcodone bit-homatropine Take 5 mLs by mouth daily as needed for cough.   ipratropium-albuterol 0.5-2.5 (3) MG/3ML Soln Commonly known as: DUONEB Inhale 3 mLs into the lungs 3 (three) times daily as needed.   losartan 100 MG tablet Commonly known as: COZAAR Take 1 tablet (100 mg total) by mouth daily. Start taking on: April 28, 2021   losartan-hydrochlorothiazide 100-25 MG tablet Commonly known as: HYZAAR Take 1 tablet by mouth daily.   methocarbamol 500 MG tablet Commonly known as: ROBAXIN Take 1 tablet (500 mg total) by mouth every 6 (six) hours as needed for muscle spasms.   montelukast  10 MG tablet Commonly known as: SINGULAIR Take 10 mg by mouth daily.   multivitamin with minerals Tabs tablet Take 1 tablet by mouth daily. Start taking on: April 28, 2021   oxybutynin 5 MG tablet Commonly known as: DITROPAN Take 5 mg by mouth 2 (two) times daily.   oxyCODONE 5 MG immediate release tablet Commonly known as: Oxy IR/ROXICODONE Take 1 tablet (5 mg total) by mouth every 6 (six) hours as needed for up to 5 days for severe pain (pain refractory to home medication).   senna 8.6 MG Tabs tablet Commonly known as: SENOKOT Take 1 tablet (8.6 mg total) by mouth 2 (two) times daily.   STOOL SOFTENER PO Take 1 tablet by mouth daily.        Follow-up Information     Loleta Dicker, PA Follow up in 2 week(s).   Contact information: [place] [address] [phone]                 Signed: Loleta Dicker 04/27/2021, 12:24 PM

## 2021-09-11 ENCOUNTER — Other Ambulatory Visit
Admission: RE | Admit: 2021-09-11 | Discharge: 2021-09-11 | Disposition: A | Discharge status: Home / Self Care | Payer: Medicare PPO | Source: Ambulatory Visit | Attending: Internal Medicine | Admitting: Internal Medicine

## 2021-09-11 DIAGNOSIS — Z79899 Other long term (current) drug therapy: Secondary | ICD-10-CM | POA: Insufficient documentation

## 2021-09-11 DIAGNOSIS — I10 Essential (primary) hypertension: Secondary | ICD-10-CM | POA: Diagnosis present

## 2021-09-11 DIAGNOSIS — R0602 Shortness of breath: Secondary | ICD-10-CM | POA: Insufficient documentation

## 2021-09-11 DIAGNOSIS — I208 Other forms of angina pectoris: Secondary | ICD-10-CM | POA: Insufficient documentation

## 2021-09-11 DIAGNOSIS — I679 Cerebrovascular disease, unspecified: Secondary | ICD-10-CM | POA: Diagnosis present

## 2021-09-11 DIAGNOSIS — Z01818 Encounter for other preprocedural examination: Secondary | ICD-10-CM | POA: Insufficient documentation

## 2021-09-11 LAB — BRAIN NATRIURETIC PEPTIDE: B Natriuretic Peptide: 206 pg/mL — ABNORMAL HIGH (ref 0.0–100.0)

## 2021-09-18 DIAGNOSIS — R0602 Shortness of breath: Secondary | ICD-10-CM

## 2021-09-18 DIAGNOSIS — I509 Heart failure, unspecified: Secondary | ICD-10-CM

## 2021-09-18 DIAGNOSIS — I200 Unstable angina: Secondary | ICD-10-CM

## 2021-09-18 DIAGNOSIS — I272 Pulmonary hypertension, unspecified: Secondary | ICD-10-CM

## 2021-10-05 ENCOUNTER — Other Ambulatory Visit: Payer: Self-pay

## 2021-10-05 ENCOUNTER — Ambulatory Visit
Admission: RE | Admit: 2021-10-05 | Discharge: 2021-10-05 | Disposition: A | Discharge status: Home / Self Care | Payer: Medicare PPO | Attending: Internal Medicine | Admitting: Internal Medicine

## 2021-10-05 ENCOUNTER — Encounter
Admission: RE | Disposition: A | Discharge status: Home / Self Care | Payer: Self-pay | Source: Home / Self Care | Attending: Internal Medicine

## 2021-10-05 ENCOUNTER — Encounter: Payer: Self-pay | Admitting: Internal Medicine

## 2021-10-05 DIAGNOSIS — I509 Heart failure, unspecified: Secondary | ICD-10-CM | POA: Insufficient documentation

## 2021-10-05 DIAGNOSIS — I25110 Atherosclerotic heart disease of native coronary artery with unstable angina pectoris: Secondary | ICD-10-CM | POA: Insufficient documentation

## 2021-10-05 DIAGNOSIS — R0602 Shortness of breath: Secondary | ICD-10-CM | POA: Insufficient documentation

## 2021-10-05 DIAGNOSIS — I200 Unstable angina: Secondary | ICD-10-CM

## 2021-10-05 DIAGNOSIS — I272 Pulmonary hypertension, unspecified: Secondary | ICD-10-CM | POA: Diagnosis not present

## 2021-10-05 HISTORY — PX: RIGHT/LEFT HEART CATH AND CORONARY ANGIOGRAPHY: CATH118266

## 2021-10-05 SURGERY — RIGHT/LEFT HEART CATH AND CORONARY ANGIOGRAPHY
Anesthesia: Moderate Sedation

## 2021-10-05 MED ORDER — SODIUM CHLORIDE 0.9 % WEIGHT BASED INFUSION
1.0000 mL/kg/h | INTRAVENOUS | Status: DC
Start: 2021-10-05 — End: 2021-10-05
  Administered 2021-10-05: 1 mL/kg/h via INTRAVENOUS

## 2021-10-05 MED ORDER — FENTANYL CITRATE (PF) 100 MCG/2ML IJ SOLN
INTRAMUSCULAR | Status: AC
  Filled 2021-10-05: qty 2

## 2021-10-05 MED ORDER — HEPARIN (PORCINE) IN NACL 1000-0.9 UT/500ML-% IV SOLN
INTRAVENOUS | Status: DC | PRN
  Administered 2021-10-05: 1000 mL

## 2021-10-05 MED ORDER — SODIUM CHLORIDE 0.9% FLUSH: 3.0000 mL | INTRAVENOUS | Status: DC | PRN

## 2021-10-05 MED ORDER — SODIUM CHLORIDE 0.9 % IV SOLN
INTRAVENOUS | Status: DC | PRN
  Administered 2021-10-05: 20 mL/h via INTRAVENOUS

## 2021-10-05 MED ORDER — HEPARIN SODIUM (PORCINE) 1000 UNIT/ML IJ SOLN
INTRAMUSCULAR | Status: DC | PRN
  Administered 2021-10-05: 5000 [IU] via INTRAVENOUS

## 2021-10-05 MED ORDER — ONDANSETRON HCL 4 MG/2ML IJ SOLN: 4.0000 mg | Freq: Four times a day (QID) | INTRAMUSCULAR | Status: DC | PRN

## 2021-10-05 MED ORDER — ASPIRIN 81 MG PO CHEW: 81.0000 mg | CHEWABLE_TABLET | ORAL | Status: AC

## 2021-10-05 MED ORDER — ACETAMINOPHEN 325 MG PO TABS: 650.0000 mg | ORAL_TABLET | ORAL | Status: DC | PRN

## 2021-10-05 MED ORDER — LIDOCAINE HCL (PF) 1 % IJ SOLN
INTRAMUSCULAR | Status: DC | PRN
  Administered 2021-10-05: 5 mL

## 2021-10-05 MED ORDER — SODIUM CHLORIDE 0.9 % IV SOLN: 250.0000 mL | INTRAVENOUS | Status: DC | PRN

## 2021-10-05 MED ORDER — LABETALOL HCL 5 MG/ML IV SOLN: 10.0000 mg | INTRAVENOUS | Status: DC | PRN

## 2021-10-05 MED ORDER — MIDAZOLAM HCL 2 MG/2ML IJ SOLN
INTRAMUSCULAR | Status: AC
  Filled 2021-10-05: qty 2

## 2021-10-05 MED ORDER — FENTANYL CITRATE (PF) 100 MCG/2ML IJ SOLN
INTRAMUSCULAR | Status: DC | PRN
  Administered 2021-10-05 (×2): 25 ug via INTRAVENOUS

## 2021-10-05 MED ORDER — SODIUM CHLORIDE 0.9 % WEIGHT BASED INFUSION: 1.0000 mL/kg/h | INTRAVENOUS | Status: DC

## 2021-10-05 MED ORDER — ASPIRIN 81 MG PO CHEW
CHEWABLE_TABLET | ORAL | Status: AC
  Administered 2021-10-05: 81 mg via ORAL
  Filled 2021-10-05: qty 1

## 2021-10-05 MED ORDER — SODIUM CHLORIDE 0.9% FLUSH: 3.0000 mL | Freq: Two times a day (BID) | INTRAVENOUS | Status: DC

## 2021-10-05 MED ORDER — IOHEXOL 300 MG/ML  SOLN
INTRAMUSCULAR | Status: DC | PRN
  Administered 2021-10-05: 258 mL

## 2021-10-05 MED ORDER — HEPARIN SODIUM (PORCINE) 1000 UNIT/ML IJ SOLN
INTRAMUSCULAR | Status: AC
  Filled 2021-10-05: qty 10

## 2021-10-05 MED ORDER — MIDAZOLAM HCL 2 MG/2ML IJ SOLN
INTRAMUSCULAR | Status: DC | PRN
  Administered 2021-10-05 (×2): 1 mg via INTRAVENOUS

## 2021-10-05 MED ORDER — VERAPAMIL HCL 2.5 MG/ML IV SOLN
INTRAVENOUS | Status: DC | PRN
  Administered 2021-10-05 (×2): 2.5 mg via INTRA_ARTERIAL

## 2021-10-05 MED ORDER — SODIUM CHLORIDE 0.9 % WEIGHT BASED INFUSION
3.0000 mL/kg/h | INTRAVENOUS | Status: AC
  Administered 2021-10-05: 3 mL/kg/h via INTRAVENOUS

## 2021-10-05 MED ORDER — HEPARIN (PORCINE) IN NACL 1000-0.9 UT/500ML-% IV SOLN
INTRAVENOUS | Status: AC
  Filled 2021-10-05: qty 1000

## 2021-10-05 MED ORDER — VERAPAMIL HCL 2.5 MG/ML IV SOLN
INTRAVENOUS | Status: AC
  Filled 2021-10-05: qty 2

## 2021-10-05 MED ORDER — HYDRALAZINE HCL 20 MG/ML IJ SOLN: 10.0000 mg | INTRAMUSCULAR | Status: DC | PRN

## 2021-10-05 SURGICAL SUPPLY — 18 items
CATH 5FR JL3.5 JR4 ANG PIG MP (CATHETERS) ×1 IMPLANT
CATH BALLN WEDGE 5F 110CM (CATHETERS) ×1 IMPLANT
CATH INFINITI 5F JL4 125CM (CATHETERS) ×1 IMPLANT
CATH INFINITI 5F MPA2 125 (CATHETERS) ×1 IMPLANT
CATH INFINITI 5FR JR4 125CM (CATHETERS) ×1 IMPLANT
CATH OPTITORQUE JACKY 4.0 5F (CATHETERS) ×1 IMPLANT
DEVICE RAD TR BAND REGULAR (VASCULAR PRODUCTS) ×1 IMPLANT
DRAPE BRACHIAL (DRAPES) ×2 IMPLANT
GLIDESHEATH SLEND SS 6F .021 (SHEATH) ×1 IMPLANT
GUIDEWIRE EMER 3M J .025X150CM (WIRE) ×1 IMPLANT
GUIDEWIRE INQWIRE 1.5J.035X260 (WIRE) IMPLANT
INQWIRE 1.5J .035X260CM (WIRE) ×2
PACK CARDIAC CATH (CUSTOM PROCEDURE TRAY) ×2 IMPLANT
PROTECTION STATION PRESSURIZED (MISCELLANEOUS) ×2
SET ATX SIMPLICITY (MISCELLANEOUS) ×1 IMPLANT
SHEATH 6FR 85 DEST SLENDER (SHEATH) ×1 IMPLANT
SHEATH GLIDE SLENDER 4/5FR (SHEATH) ×1 IMPLANT
STATION PROTECTION PRESSURIZED (MISCELLANEOUS) IMPLANT

## 2021-10-09 ENCOUNTER — Encounter: Payer: Self-pay | Admitting: Internal Medicine

## 2021-10-18 ENCOUNTER — Encounter: Payer: Self-pay | Admitting: Internal Medicine

## 2021-10-18 LAB — CARDIAC CATHETERIZATION: Cath EF Quantitative: 60 %

## 2021-11-09 ENCOUNTER — Encounter: Payer: Medicare PPO | Attending: Internal Medicine | Admitting: *Deleted

## 2021-11-09 ENCOUNTER — Encounter: Payer: Self-pay | Admitting: *Deleted

## 2021-11-09 ENCOUNTER — Other Ambulatory Visit: Payer: Self-pay

## 2021-11-09 DIAGNOSIS — Z48812 Encounter for surgical aftercare following surgery on the circulatory system: Secondary | ICD-10-CM | POA: Insufficient documentation

## 2021-11-09 DIAGNOSIS — Z955 Presence of coronary angioplasty implant and graft: Secondary | ICD-10-CM | POA: Insufficient documentation

## 2021-11-09 NOTE — Progress Notes (Signed)
Virtual orientation call completed today. he has an appointment on Date: 11/23/2021  for EP eval and gym Orientation.  Documentation of diagnosis can be found in [facility] Date: 10/30/2021 .

## 2021-11-23 ENCOUNTER — Other Ambulatory Visit: Payer: Self-pay

## 2021-11-23 VITALS — Ht 69.5 in | Wt 252.3 lb

## 2021-11-23 DIAGNOSIS — Z955 Presence of coronary angioplasty implant and graft: Secondary | ICD-10-CM

## 2021-11-23 DIAGNOSIS — Z48812 Encounter for surgical aftercare following surgery on the circulatory system: Secondary | ICD-10-CM | POA: Diagnosis not present

## 2021-11-23 NOTE — Patient Instructions (Signed)
Patient Instructions  Patient Details  Name: David Jennings MRN: [id number] Date of Birth: 11/28/1940 Referring Provider:  Yolonda Kida, MD  Below are your personal goals for exercise, nutrition, and risk factors. Our goal is to help you stay on track towards obtaining and maintaining these goals. We will be discussing your progress on these goals with you throughout the program.  Initial Exercise Prescription:  Initial Exercise Prescription - 11/23/21 1500       Date of Initial Exercise RX and Referring Provider   Date 11/23/21    Referring Provider Lujean Amel MD      Oxygen   Maintain Oxygen Saturation 88% or higher      NuStep   Level 1    SPM 80    Minutes 15    METs 1.5      REL-XR   Level 1    Speed 50    Minutes 15    METs 1.5      T5 Nustep   Level 1    SPM 80    Minutes 15    METs 1.5      Track   Laps 23    Minutes 15    METs 2.25      Prescription Details   Frequency (times per week) 3    Duration Progress to 30 minutes of continuous aerobic without signs/symptoms of physical distress      Intensity   THRR 40-80% of Max Heartrate 93- 123    Ratings of Perceived Exertion 11-13    Perceived Dyspnea 0-4      Progression   Progression Continue to progress workloads to maintain intensity without signs/symptoms of physical distress.      Resistance Training   Training Prescription Yes    Weight 3 lb    Reps 10-15             Exercise Goals: Frequency: Be able to perform aerobic exercise two to three times per week in program working toward 2-5 days per week of home exercise.  Intensity: Work with a perceived exertion of 11 (fairly light) - 15 (hard) while following your exercise prescription.  We will make changes to your prescription with you as you progress through the program.   Duration: Be able to do 30 to 45 minutes of continuous aerobic exercise in addition to a 5 minute warm-up and a 5 minute cool-down routine.    Nutrition Goals: Your personal nutrition goals will be established when you do your nutrition analysis with the dietician.  The following are general nutrition guidelines to follow: Cholesterol < 200mg /day Sodium < 1500mg /day Fiber: Men over 50 yrs - 30 grams per day  Personal Goals:  Personal Goals and Risk Factors at Admission - 11/23/21 1529       Core Components/Risk Factors/Patient Goals on Admission    Weight Management Yes;Weight Loss    Intervention Weight Management: Develop a combined nutrition and exercise program designed to reach desired caloric intake, while maintaining appropriate intake of nutrient and fiber, sodium and fats, and appropriate energy expenditure required for the weight goal.;Weight Management/Obesity: Establish reasonable short term and long term weight goals.;Weight Management: Provide education and appropriate resources to help participant work on and attain dietary goals.    Admit Weight 252 lb (114.3 kg)    Goal Weight: Short Term 247 lb (112 kg)    Goal Weight: Long Term 220 lb (99.8 kg)    Expected Outcomes Long Term: Adherence to nutrition and  physical activity/exercise program aimed toward attainment of established weight goal;Short Term: Continue to assess and modify interventions until short term weight is achieved;Weight Loss: Understanding of general recommendations for a balanced deficit meal plan, which promotes 1-2 lb weight loss per week and includes a negative energy balance of [phone] kcal/d;Understanding recommendations for meals to include 15-35% energy as protein, 25-35% energy from fat, 35-60% energy from carbohydrates, less than 200mg  of dietary cholesterol, 20-35 gm of total fiber daily;Understanding of distribution of calorie intake throughout the day with the consumption of 4-5 meals/snacks    Hypertension Yes    Intervention Provide education on lifestyle modifcations including regular physical activity/exercise, weight management,  moderate sodium restriction and increased consumption of fresh fruit, vegetables, and low fat dairy, alcohol moderation, and smoking cessation.;Monitor prescription use compliance.    Expected Outcomes Short Term: Continued assessment and intervention until BP is < 140/82mm HG in hypertensive participants. < 130/53mm HG in hypertensive participants with diabetes, heart failure or chronic kidney disease.;Long Term: Maintenance of blood pressure at goal levels.    Lipids Yes    Intervention Provide education and support for participant on nutrition & aerobic/resistive exercise along with prescribed medications to achieve LDL 70mg , HDL >40mg .    Expected Outcomes Short Term: Participant states understanding of desired cholesterol values and is compliant with medications prescribed. Participant is following exercise prescription and nutrition guidelines.;Long Term: Cholesterol controlled with medications as prescribed, with individualized exercise RX and with personalized nutrition plan. Value goals: LDL < 70mg , HDL > 40 mg.             Tobacco Use Initial Evaluation: Social History   Tobacco Use  Smoking Status Never  Smokeless Tobacco Never    Exercise Goals and Review:  Exercise Goals     Row Name 11/23/21 1528             Exercise Goals   Increase Physical Activity Yes       Intervention Provide advice, education, support and counseling about physical activity/exercise needs.;Develop an individualized exercise prescription for aerobic and resistive training based on initial evaluation findings, risk stratification, comorbidities and participant's personal goals.       Expected Outcomes Short Term: Attend rehab on a regular basis to increase amount of physical activity.;Long Term: Add in home exercise to make exercise part of routine and to increase amount of physical activity.;Long Term: Exercising regularly at least 3-5 days a week.       Increase Strength and Stamina Yes        Intervention Provide advice, education, support and counseling about physical activity/exercise needs.;Develop an individualized exercise prescription for aerobic and resistive training based on initial evaluation findings, risk stratification, comorbidities and participant's personal goals.       Expected Outcomes Short Term: Increase workloads from initial exercise prescription for resistance, speed, and METs.;Short Term: Perform resistance training exercises routinely during rehab and add in resistance training at home;Long Term: Improve cardiorespiratory fitness, muscular endurance and strength as measured by increased METs and functional capacity (6MWT)       Able to understand and use rate of perceived exertion (RPE) scale Yes       Intervention Provide education and explanation on how to use RPE scale       Expected Outcomes Short Term: Able to use RPE daily in rehab to express subjective intensity level;Long Term:  Able to use RPE to guide intensity level when exercising independently       Able to understand and  use Dyspnea scale Yes       Intervention Provide education and explanation on how to use Dyspnea scale       Expected Outcomes Short Term: Able to use Dyspnea scale daily in rehab to express subjective sense of shortness of breath during exertion;Long Term: Able to use Dyspnea scale to guide intensity level when exercising independently       Knowledge and understanding of Target Heart Rate Range (THRR) Yes       Intervention Provide education and explanation of THRR including how the numbers were predicted and where they are located for reference       Expected Outcomes Short Term: Able to state/look up THRR;Long Term: Able to use THRR to govern intensity when exercising independently;Short Term: Able to use daily as guideline for intensity in rehab       Able to check pulse independently Yes       Intervention Provide education and demonstration on how to check pulse in carotid and  radial arteries.;Review the importance of being able to check your own pulse for safety during independent exercise       Expected Outcomes Short Term: Able to explain why pulse checking is important during independent exercise;Long Term: Able to check pulse independently and accurately       Understanding of Exercise Prescription Yes       Intervention Provide education, explanation, and written materials on patient's individual exercise prescription       Expected Outcomes Short Term: Able to explain program exercise prescription;Long Term: Able to explain home exercise prescription to exercise independently                Copy of goals given to participant.

## 2021-11-23 NOTE — Progress Notes (Signed)
Cardiac Individual Treatment Plan  Patient Details  Name: David Jennings MRN: [id number] Date of Birth: 11-16-1940 Referring Provider:   Flowsheet Row Cardiac Rehab from 11/23/2021 in [facility] Cardiac and Pulmonary Rehab  Referring Provider Lujean Amel MD       Initial Encounter Date:  Flowsheet Row Cardiac Rehab from 11/23/2021 in [facility] Cardiac and Pulmonary Rehab  Date 11/23/21       Visit Diagnosis: Status post coronary artery stent placement  Patient's Home Medications on Admission:  Current Outpatient Medications:    acetaminophen (TYLENOL) 500 MG tablet, Take 2 tablets (1,000 mg total) by mouth every 6 (six) hours. (Patient not taking: Reported on 10/02/2021), Disp: 30 tablet, Rfl: 0   albuterol (VENTOLIN HFA) 108 (90 Base) MCG/ACT inhaler, Inhale 2 puffs into the lungs every 6 (six) hours as needed for wheezing. Only takes PRN, Disp: , Rfl:    amLODipine (NORVASC) 5 MG tablet, Take 5 mg by mouth daily., Disp: , Rfl:    atenolol (TENORMIN) 25 MG tablet, Take 25 mg by mouth daily., Disp: , Rfl:    atorvastatin (LIPITOR) 40 MG tablet, Take 1 tablet (40 mg total) by mouth daily at 6 PM., Disp: 30 tablet, Rfl: 0   atorvastatin (LIPITOR) 40 MG tablet, Take 1 tablet (40 mg total) by mouth daily., Disp: , Rfl:    Budeson-Glycopyrrol-Formoterol (BREZTRI AEROSPHERE) 160-9-4.8 MCG/ACT AERO, Inhale 2 puffs into the lungs in the morning and at bedtime., Disp: , Rfl:    celecoxib (CELEBREX) 200 MG capsule, Take 1 capsule (200 mg total) by mouth every 12 (twelve) hours. (Patient not taking: Reported on 10/02/2021), Disp: 60 capsule, Rfl: 0   clopidogrel (PLAVIX) 75 MG tablet, Take 75 mg by mouth daily., Disp: , Rfl:    Docusate Calcium (STOOL SOFTENER PO), Take 1 tablet by mouth every other day., Disp: , Rfl:    dupilumab (DUPIXENT) 300 MG/2ML prefilled syringe, Inject 300 mg into the skin every 14 (fourteen) days., Disp: , Rfl:    fluocinonide cream (LIDEX) 2.35 %, Apply 1 application  topically daily as needed (irritation)., Disp: , Rfl:    fluticasone (FLONASE) 50 MCG/ACT nasal spray, Place 2 sprays into both nostrils daily., Disp: , Rfl:    hydrochlorothiazide (HYDRODIURIL) 25 MG tablet, Take 1 tablet (25 mg total) by mouth daily. (Patient not taking: Reported on 11/09/2021), Disp: , Rfl:    ipratropium (ATROVENT HFA) 17 MCG/ACT inhaler, Inhale 2 puffs into the lungs every 6 (six) hours as needed for wheezing., Disp: , Rfl:    ipratropium-albuterol (DUONEB) 0.5-2.5 (3) MG/3ML SOLN, Inhale 3 mLs into the lungs every 6 (six) hours as needed (shortness of breath or wheezing)., Disp: , Rfl:    losartan (COZAAR) 100 MG tablet, Take 1 tablet (100 mg total) by mouth daily. (Patient not taking: Reported on 10/02/2021), Disp: , Rfl:    losartan-hydrochlorothiazide (HYZAAR) 100-25 MG tablet, Take 1 tablet by mouth daily., Disp: , Rfl:    methocarbamol (ROBAXIN) 500 MG tablet, Take 1 tablet (500 mg total) by mouth every 6 (six) hours as needed for muscle spasms. (Patient not taking: Reported on 10/02/2021), Disp: 60 tablet, Rfl: 0   montelukast (SINGULAIR) 10 MG tablet, Take 10 mg by mouth daily., Disp: , Rfl:    Multiple Vitamin (MULTIVITAMIN WITH MINERALS) TABS tablet, Take 1 tablet by mouth daily., Disp: , Rfl:    nitroGLYCERIN (NITROSTAT) 0.4 MG SL tablet, Place under the tongue., Disp: , Rfl:    oxybutynin (DITROPAN) 5 MG tablet, Take 5  mg by mouth 2 (two) times daily., Disp: , Rfl:    prednisoLONE acetate (PRED FORTE) 1 % ophthalmic suspension, Place 1 drop into both eyes as needed., Disp: , Rfl:    senna (SENOKOT) 8.6 MG TABS tablet, Take 1 tablet (8.6 mg total) by mouth 2 (two) times daily. (Patient not taking: Reported on 10/02/2021), Disp: 120 tablet, Rfl: 0  Past Medical History: Past Medical History:  Diagnosis Date   Arthritis    Asthma    Cancer ([place]) 2010   prostate CA   COVID-19    Dyspnea    Eczema    Hypertension    Pneumonia    Sleep apnea    uses cpap   Stroke  ([place])     Tobacco Use: Social History   Tobacco Use  Smoking Status Never  Smokeless Tobacco Never    Labs: Recent Review Flowsheet Data     Labs for ITP Cardiac and Pulmonary Rehab Latest Ref Rng & Units 10/06/2017   Cholestrol 0 - 200 mg/dL 166   LDLCALC 0 - 99 mg/dL 103(H)   HDL >40 mg/dL 26(L)   Trlycerides <150 mg/dL 184(H)        Exercise Target Goals: Exercise Program Goal: Individual exercise prescription set using results from initial 6 min walk test and THRR while considering  patients activity barriers and safety.   Exercise Prescription Goal: Initial exercise prescription builds to 30-45 minutes a day of aerobic activity, 2-3 days per week.  Home exercise guidelines will be given to patient during program as part of exercise prescription that the participant will acknowledge.   Education: Aerobic Exercise: - Group verbal and visual presentation on the components of exercise prescription. Introduces F.I.T.T principle from ACSM for exercise prescriptions.  Reviews F.I.T.T. principles of aerobic exercise including progression. Written material given at graduation.   Education: Resistance Exercise: - Group verbal and visual presentation on the components of exercise prescription. Introduces F.I.T.T principle from ACSM for exercise prescriptions  Reviews F.I.T.T. principles of resistance exercise including progression. Written material given at graduation.    Education: Exercise & Equipment Safety: - Individual verbal instruction and demonstration of equipment use and safety with use of the equipment. Flowsheet Row Cardiac Rehab from 11/23/2021 in [facility] Cardiac and Pulmonary Rehab  Education need identified 11/23/21  Date 11/23/21  Educator [place]  Instruction Review Code 1- Verbalizes Understanding       Education: Exercise Physiology & General Exercise Guidelines: - Group verbal and written instruction with models to review the exercise physiology of the  cardiovascular system and associated critical values. Provides general exercise guidelines with specific guidelines to those with heart or lung disease.  Flowsheet Row Cardiac Rehab from 11/23/2021 in [facility] Cardiac and Pulmonary Rehab  Education need identified 11/23/21       Education: Flexibility, Balance, Mind/Body Relaxation: - Group verbal and visual presentation with interactive activity on the components of exercise prescription. Introduces F.I.T.T principle from ACSM for exercise prescriptions. Reviews F.I.T.T. principles of flexibility and balance exercise training including progression. Also discusses the mind body connection.  Reviews various relaxation techniques to help reduce and manage stress (i.e. Deep breathing, progressive muscle relaxation, and visualization). Balance handout provided to take home. Written material given at graduation.   Activity Barriers & Risk Stratification:  Activity Barriers & Cardiac Risk Stratification - 11/23/21 1508       Activity Barriers & Cardiac Risk Stratification   Activity Barriers Right Knee Replacement;Left Knee Replacement;Joint Problems;Other (comment);Deconditioning;Muscular Weakness    Comments  hip pain, surgery to help. started walking again outside- asthma flared.  Right shoulder problem-rotator cuff shot    Cardiac Risk Stratification High             6 Minute Walk:  6 Minute Walk     Row Name 11/23/21 1515         6 Minute Walk   Phase Initial     Distance 1212 feet     Walk Time 6 minutes     # of Rest Breaks 0     MPH 2.29     METS 1.56     RPE 11     Perceived Dyspnea  0     VO2 Peak 5.47     Symptoms No     Resting HR 64 bpm     Resting BP 124/68     Resting Oxygen Saturation  94 %     Exercise Oxygen Saturation  during 6 min walk 89 %     Max Ex. HR 93 bpm     Max Ex. BP 142/68     2 Minute Post BP 118/68              Oxygen Initial Assessment:   Oxygen Re-Evaluation:   Oxygen Discharge  (Final Oxygen Re-Evaluation):   Initial Exercise Prescription:  Initial Exercise Prescription - 11/23/21 1500       Date of Initial Exercise RX and Referring Provider   Date 11/23/21    Referring Provider Lujean Amel MD      Oxygen   Maintain Oxygen Saturation 88% or higher      NuStep   Level 1    SPM 80    Minutes 15    METs 1.5      REL-XR   Level 1    Speed 50    Minutes 15    METs 1.5      T5 Nustep   Level 1    SPM 80    Minutes 15    METs 1.5      Track   Laps 23    Minutes 15    METs 2.25      Prescription Details   Frequency (times per week) 3    Duration Progress to 30 minutes of continuous aerobic without signs/symptoms of physical distress      Intensity   THRR 40-80% of Max Heartrate 93- 123    Ratings of Perceived Exertion 11-13    Perceived Dyspnea 0-4      Progression   Progression Continue to progress workloads to maintain intensity without signs/symptoms of physical distress.      Resistance Training   Training Prescription Yes    Weight 3 lb    Reps 10-15             Perform Capillary Blood Glucose checks as needed.  Exercise Prescription Changes:   Exercise Prescription Changes     Row Name 11/23/21 1500             Response to Exercise   Blood Pressure (Admit) 124/68       Blood Pressure (Exercise) 142/68       Blood Pressure (Exit) 118/68       Heart Rate (Admit) 64 bpm       Heart Rate (Exercise) 93 bpm       Heart Rate (Exit) 63 bpm       Oxygen Saturation (Admit) 94 %  Oxygen Saturation (Exercise) 89 %  wear pulse ox during exercise       Oxygen Saturation (Exit) 92 %       Rating of Perceived Exertion (Exercise) 11       Perceived Dyspnea (Exercise) 0       Symptoms none       Comments walk test results                Exercise Comments:   Exercise Goals and Review:   Exercise Goals     Row Name 11/23/21 1528             Exercise Goals   Increase Physical Activity Yes        Intervention Provide advice, education, support and counseling about physical activity/exercise needs.;Develop an individualized exercise prescription for aerobic and resistive training based on initial evaluation findings, risk stratification, comorbidities and participant's personal goals.       Expected Outcomes Short Term: Attend rehab on a regular basis to increase amount of physical activity.;Long Term: Add in home exercise to make exercise part of routine and to increase amount of physical activity.;Long Term: Exercising regularly at least 3-5 days a week.       Increase Strength and Stamina Yes       Intervention Provide advice, education, support and counseling about physical activity/exercise needs.;Develop an individualized exercise prescription for aerobic and resistive training based on initial evaluation findings, risk stratification, comorbidities and participant's personal goals.       Expected Outcomes Short Term: Increase workloads from initial exercise prescription for resistance, speed, and METs.;Short Term: Perform resistance training exercises routinely during rehab and add in resistance training at home;Long Term: Improve cardiorespiratory fitness, muscular endurance and strength as measured by increased METs and functional capacity (6MWT)       Able to understand and use rate of perceived exertion (RPE) scale Yes       Intervention Provide education and explanation on how to use RPE scale       Expected Outcomes Short Term: Able to use RPE daily in rehab to express subjective intensity level;Long Term:  Able to use RPE to guide intensity level when exercising independently       Able to understand and use Dyspnea scale Yes       Intervention Provide education and explanation on how to use Dyspnea scale       Expected Outcomes Short Term: Able to use Dyspnea scale daily in rehab to express subjective sense of shortness of breath during exertion;Long Term: Able to use Dyspnea scale to  guide intensity level when exercising independently       Knowledge and understanding of Target Heart Rate Range (THRR) Yes       Intervention Provide education and explanation of THRR including how the numbers were predicted and where they are located for reference       Expected Outcomes Short Term: Able to state/look up THRR;Long Term: Able to use THRR to govern intensity when exercising independently;Short Term: Able to use daily as guideline for intensity in rehab       Able to check pulse independently Yes       Intervention Provide education and demonstration on how to check pulse in carotid and radial arteries.;Review the importance of being able to check your own pulse for safety during independent exercise       Expected Outcomes Short Term: Able to explain why pulse checking is important during independent exercise;Long Term: Able  to check pulse independently and accurately       Understanding of Exercise Prescription Yes       Intervention Provide education, explanation, and written materials on patient's individual exercise prescription       Expected Outcomes Short Term: Able to explain program exercise prescription;Long Term: Able to explain home exercise prescription to exercise independently                Exercise Goals Re-Evaluation :   Discharge Exercise Prescription (Final Exercise Prescription Changes):  Exercise Prescription Changes - 11/23/21 1500       Response to Exercise   Blood Pressure (Admit) 124/68    Blood Pressure (Exercise) 142/68    Blood Pressure (Exit) 118/68    Heart Rate (Admit) 64 bpm    Heart Rate (Exercise) 93 bpm    Heart Rate (Exit) 63 bpm    Oxygen Saturation (Admit) 94 %    Oxygen Saturation (Exercise) 89 %   wear pulse ox during exercise   Oxygen Saturation (Exit) 92 %    Rating of Perceived Exertion (Exercise) 11    Perceived Dyspnea (Exercise) 0    Symptoms none    Comments walk test results             Nutrition:  Target  Goals: Understanding of nutrition guidelines, daily intake of sodium 1500mg , cholesterol 200mg , calories 30% from fat and 7% or less from saturated fats, daily to have 5 or more servings of fruits and vegetables.  Education: All About Nutrition: -Group instruction provided by verbal, written material, interactive activities, discussions, models, and posters to present general guidelines for heart healthy nutrition including fat, fiber, MyPlate, the role of sodium in heart healthy nutrition, utilization of the nutrition label, and utilization of this knowledge for meal planning. Follow up email sent as well. Written material given at graduation.   Biometrics:  Pre Biometrics - 11/23/21 1507       Pre Biometrics   Height 5' 9.5" (1.765 m)    Weight 252 lb 4.8 oz (114.4 kg)    BMI (Calculated) 36.74    Single Leg Stand 3.6 seconds              Nutrition Therapy Plan and Nutrition Goals:   Nutrition Assessments:  MEDIFICTS Score Key: ?70 Need to make dietary changes  40-70 Heart Healthy Diet ? 40 Therapeutic Level Cholesterol Diet   Picture Your Plate Scores: <44 Unhealthy dietary pattern with much room for improvement. 41-50 Dietary pattern unlikely to meet recommendations for good health and room for improvement. 51-60 More healthful dietary pattern, with some room for improvement.  >60 Healthy dietary pattern, although there may be some specific behaviors that could be improved.    Nutrition Goals Re-Evaluation:   Nutrition Goals Discharge (Final Nutrition Goals Re-Evaluation):   Psychosocial: Target Goals: Acknowledge presence or absence of significant depression and/or stress, maximize coping skills, provide positive support system. Participant is able to verbalize types and ability to use techniques and skills needed for reducing stress and depression.   Education: Stress, Anxiety, and Depression - Group verbal and visual presentation to define topics covered.   Reviews how body is impacted by stress, anxiety, and depression.  Also discusses healthy ways to reduce stress and to treat/manage anxiety and depression.  Written material given at graduation.   Education: Sleep Hygiene -Provides group verbal and written instruction about how sleep can affect your health.  Define sleep hygiene, discuss sleep cycles and impact of sleep habits.  Review good sleep hygiene tips.    Initial Review & Psychosocial Screening:  Initial Psych Review & Screening - 11/09/21 0924       Initial Review   Current issues with None Identified      Family Dynamics   Good Support System? Yes   wife     Barriers   Psychosocial barriers to participate in program There are no identifiable barriers or psychosocial needs.;The patient should benefit from training in stress management and relaxation.      Screening Interventions   Interventions Encouraged to exercise;Provide feedback about the scores to participant;To provide support and resources with identified psychosocial needs    Expected Outcomes Short Term goal: Utilizing psychosocial counselor, staff and physician to assist with identification of specific Stressors or current issues interfering with healing process. Setting desired goal for each stressor or current issue identified.;Long Term Goal: Stressors or current issues are controlled or eliminated.;Short Term goal: Identification and review with participant of any Quality of Life or Depression concerns found by scoring the questionnaire.;Long Term goal: The participant improves quality of Life and PHQ9 Scores as seen by post scores and/or verbalization of changes             Quality of Life Scores:   Quality of Life - 11/23/21 1434       Quality of Life   Select Quality of Life      Quality of Life Scores   Health/Function Pre 12 %    Socioeconomic Pre 20.75 %    Psych/Spiritual Pre 21.43 %    Family Pre 24 %    GLOBAL Pre 17.6 %             Scores of 19 and below usually indicate a poorer quality of life in these areas.  A difference of  2-3 points is a clinically meaningful difference.  A difference of 2-3 points in the total score of the Quality of Life Index has been associated with significant improvement in overall quality of life, self-image, physical symptoms, and general health in studies assessing change in quality of life.  PHQ-9: Recent Review Flowsheet Data     Depression screen [facility] 2/9 11/23/2021   Decreased Interest 0   Down, Depressed, Hopeless 0   PHQ - 2 Score 0   Altered sleeping 0   Tired, decreased energy 0   Change in appetite 0   Feeling bad or failure about yourself  0   Trouble concentrating 0   Moving slowly or fidgety/restless 0   Suicidal thoughts 0   PHQ-9 Score 0   Difficult doing work/chores Not difficult at all      Interpretation of Total Score  Total Score Depression Severity:  1-4 = Minimal depression, 5-9 = Mild depression, 10-14 = Moderate depression, 15-19 = Moderately severe depression, 20-27 = Severe depression   Psychosocial Evaluation and Intervention:  Psychosocial Evaluation - 11/09/21 0931       Psychosocial Evaluation & Interventions   Interventions Encouraged to exercise with the program and follow exercise prescription    Comments Timmothy Sours has no barriers to attending the program. He lives with his wife at [facility], she is his support. Timmothy Sours would like to create the habit of exercise that he can continue the routine after discharge. He is excited to join the program and a bit apprehensive because it is something new.  He does have some joint problems that at this moment he does not feel will hinder his activity here.  Expected Outcomes STG Timmothy Sours will attend all scheduled sessions, he will establish an exercise routine. LTG Timmothy Sours will continue his progress from the program after discharge    Continue Psychosocial Services  Follow up required by staff              Psychosocial Re-Evaluation:   Psychosocial Discharge (Final Psychosocial Re-Evaluation):   Vocational Rehabilitation: Provide vocational rehab assistance to qualifying candidates.   Vocational Rehab Evaluation & Intervention:  Vocational Rehab - 11/09/21 2248       Initial Vocational Rehab Evaluation & Intervention   Assessment shows need for Vocational Rehabilitation No      Vocational Rehab Re-Evaulation   Comments retired             Education: Education Goals: Education classes will be provided on a variety of topics geared toward better understanding of heart health and risk factor modification. Participant will state understanding/return demonstration of topics presented as noted by education test scores.  Learning Barriers/Preferences:  Learning Barriers/Preferences - 11/09/21 0926       Learning Barriers/Preferences   Learning Barriers None    Learning Preferences None             General Cardiac Education Topics:  AED/CPR: - Group verbal and written instruction with the use of models to demonstrate the basic use of the AED with the basic ABC's of resuscitation.   Anatomy and Cardiac Procedures: - Group verbal and visual presentation and models provide information about basic cardiac anatomy and function. Reviews the testing methods done to diagnose heart disease and the outcomes of the test results. Describes the treatment choices: Medical Management, Angioplasty, or Coronary Bypass Surgery for treating various heart conditions including Myocardial Infarction, Angina, Valve Disease, and Cardiac Arrhythmias.  Written material given at graduation. Flowsheet Row Cardiac Rehab from 11/23/2021 in [facility] Cardiac and Pulmonary Rehab  Education need identified 11/23/21       Medication Safety: - Group verbal and visual instruction to review commonly prescribed medications for heart and lung disease. Reviews the medication, class of the drug, and side  effects. Includes the steps to properly store meds and maintain the prescription regimen.  Written material given at graduation.   Intimacy: - Group verbal instruction through game format to discuss how heart and lung disease can affect sexual intimacy. Written material given at graduation..   Know Your Numbers and Heart Failure: - Group verbal and visual instruction to discuss disease risk factors for cardiac and pulmonary disease and treatment options.  Reviews associated critical values for Overweight/Obesity, Hypertension, Cholesterol, and Diabetes.  Discusses basics of heart failure: signs/symptoms and treatments.  Introduces Heart Failure Zone chart for action plan for heart failure.  Written material given at graduation. Flowsheet Row Cardiac Rehab from 11/23/2021 in [facility] Cardiac and Pulmonary Rehab  Education need identified 11/23/21       Infection Prevention: - Provides verbal and written material to individual with discussion of infection control including proper hand washing and proper equipment cleaning during exercise session. Flowsheet Row Cardiac Rehab from 11/23/2021 in [facility] Cardiac and Pulmonary Rehab  Education need identified 11/23/21  Date 11/23/21  Educator [place]  Instruction Review Code 1- Verbalizes Understanding       Falls Prevention: - Provides verbal and written material to individual with discussion of falls prevention and safety. Flowsheet Row Cardiac Rehab from 11/23/2021 in [facility] Cardiac and Pulmonary Rehab  Education need identified 11/23/21  Date 11/23/21  Educator [place]  Instruction Review Code 1- Verbalizes Understanding  Other: -Provides group and verbal instruction on various topics (see comments)   Knowledge Questionnaire Score:  Knowledge Questionnaire Score - 11/23/21 1431       Knowledge Questionnaire Score   Pre Score 23/26: Angina, Chest pain, Exercise             Core Components/Risk Factors/Patient Goals at Admission:   Personal Goals and Risk Factors at Admission - 11/23/21 1529       Core Components/Risk Factors/Patient Goals on Admission    Weight Management Yes;Weight Loss    Intervention Weight Management: Develop a combined nutrition and exercise program designed to reach desired caloric intake, while maintaining appropriate intake of nutrient and fiber, sodium and fats, and appropriate energy expenditure required for the weight goal.;Weight Management/Obesity: Establish reasonable short term and long term weight goals.;Weight Management: Provide education and appropriate resources to help participant work on and attain dietary goals.    Admit Weight 252 lb (114.3 kg)    Goal Weight: Short Term 247 lb (112 kg)    Goal Weight: Long Term 220 lb (99.8 kg)    Expected Outcomes Long Term: Adherence to nutrition and physical activity/exercise program aimed toward attainment of established weight goal;Short Term: Continue to assess and modify interventions until short term weight is achieved;Weight Loss: Understanding of general recommendations for a balanced deficit meal plan, which promotes 1-2 lb weight loss per week and includes a negative energy balance of [id number] kcal/d;Understanding recommendations for meals to include 15-35% energy as protein, 25-35% energy from fat, 35-60% energy from carbohydrates, less than 200mg  of dietary cholesterol, 20-35 gm of total fiber daily;Understanding of distribution of calorie intake throughout the day with the consumption of 4-5 meals/snacks    Hypertension Yes    Intervention Provide education on lifestyle modifcations including regular physical activity/exercise, weight management, moderate sodium restriction and increased consumption of fresh fruit, vegetables, and low fat dairy, alcohol moderation, and smoking cessation.;Monitor prescription use compliance.    Expected Outcomes Short Term: Continued assessment and intervention until BP is < 140/14mm HG in hypertensive  participants. < 130/14mm HG in hypertensive participants with diabetes, heart failure or chronic kidney disease.;Long Term: Maintenance of blood pressure at goal levels.    Lipids Yes    Intervention Provide education and support for participant on nutrition & aerobic/resistive exercise along with prescribed medications to achieve LDL 70mg , HDL >40mg .    Expected Outcomes Short Term: Participant states understanding of desired cholesterol values and is compliant with medications prescribed. Participant is following exercise prescription and nutrition guidelines.;Long Term: Cholesterol controlled with medications as prescribed, with individualized exercise RX and with personalized nutrition plan. Value goals: LDL < 70mg , HDL > 40 mg.             Education:Diabetes - Individual verbal and written instruction to review signs/symptoms of diabetes, desired ranges of glucose level fasting, after meals and with exercise. Acknowledge that pre and post exercise glucose checks will be done for 3 sessions at entry of program.   Core Components/Risk Factors/Patient Goals Review:    Core Components/Risk Factors/Patient Goals at Discharge (Final Review):    ITP Comments:  ITP Comments     Row Name 11/09/21 0953 11/23/21 1310 11/23/21 1311       ITP Comments Virtual orientation call completed today. he has an appointment on Date: 11/23/2021  for EP eval and gym Orientation.  Documentation of diagnosis can be found in [facility] Date: 10/30/2021 . Completed 6MWT and gym orientation. Initial ITP created and sent for review  to Dr. Emily Filbert, Medical Director. Sent EKG strips to Dr. Etta Quill office to look at rhythm.              Comments: Initial ITP

## 2021-11-27 ENCOUNTER — Encounter: Payer: Medicare PPO | Admitting: *Deleted

## 2021-11-27 ENCOUNTER — Other Ambulatory Visit: Payer: Self-pay

## 2021-11-27 DIAGNOSIS — Z955 Presence of coronary angioplasty implant and graft: Secondary | ICD-10-CM

## 2021-11-27 DIAGNOSIS — Z48812 Encounter for surgical aftercare following surgery on the circulatory system: Secondary | ICD-10-CM | POA: Diagnosis not present

## 2021-11-27 NOTE — Progress Notes (Signed)
Daily Session Note  Patient Details  Name: David Jennings MRN: [id number] Date of Birth: June 18, 1941 Referring Provider:   Flowsheet Row Cardiac Rehab from 11/23/2021 in [facility] Cardiac and Pulmonary Rehab  Referring Provider Lujean Amel MD       Encounter Date: 11/27/2021  Check In:  Session Check In - 11/27/21 0814       Check-In   Supervising physician immediately available to respond to emergencies See telemetry face sheet for immediately available ER MD    Location [facility]-Cardiac & Pulmonary Rehab    Staff Present Heath Lark, RN, BSN, CCRP;Joseph [place], RCP,RRT,BSRT;Kelly [place], [place], ACSM CEP, Exercise Physiologist    Virtual Visit No    Medication changes reported     Yes    Comments atenolol increased from 25 mg a day to 25 mg twice a day    Fall or balance concerns reported    No    Warm-up and Cool-down Performed on first and last piece of equipment    Resistance Training Performed Yes    VAD Patient? No    PAD/SET Patient? No      Pain Assessment   Currently in Pain? No/denies                Social History   Tobacco Use  Smoking Status Never  Smokeless Tobacco Never    Goals Met:  Exercise tolerated well Personal goals reviewed No report of concerns or symptoms today  Goals Unmet:  Not Applicable  Comments: First full day of exercise!  Patient was oriented to gym and equipment including functions, settings, policies, and procedures.  Patient's individual exercise prescription and treatment plan were reviewed.  All starting workloads were established based on the results of the 6 minute walk test done at initial orientation visit.  The plan for exercise progression was also introduced and progression will be customized based on patient's performance and goals.    Dr. Emily Filbert is Medical Director for [place].  Dr. Ottie Glazier is Medical Director for [facility] Pulmonary Rehabilitation.

## 2021-11-29 ENCOUNTER — Other Ambulatory Visit: Payer: Self-pay

## 2021-11-29 ENCOUNTER — Encounter: Payer: Medicare PPO | Attending: Internal Medicine

## 2021-11-29 DIAGNOSIS — Z955 Presence of coronary angioplasty implant and graft: Secondary | ICD-10-CM | POA: Diagnosis present

## 2021-11-29 NOTE — Progress Notes (Signed)
Daily Session Note ? ?Patient Details  ?Name: David Jennings ?MRN: [id number] ?Date of Birth: 1941-08-20 ?Referring Provider:   ?Flowsheet Row Cardiac Rehab from 11/23/2021 in [facility] Cardiac and Pulmonary Rehab  ?Referring Provider Lujean Amel MD  ? ?  ? ? ?Encounter Date: 11/29/2021 ? ?Check In: ? Session Check In - 11/29/21 0734   ? ?  ? Check-In  ? Supervising physician immediately available to respond to emergencies See telemetry face sheet for immediately available ER MD   ? Location [facility]-Cardiac & Pulmonary Rehab   ? Staff Present Birdie Sons, MPA, RN;Joseph [place], RCP,RRT,BSRT;Amanda Sommer, BA, ACSM CEP, Exercise Physiologist   ? Virtual Visit No   ? Medication changes reported     No   ? Fall or balance concerns reported    No   ? Warm-up and Cool-down Performed on first and last piece of equipment   ? Resistance Training Performed Yes   ? VAD Patient? No   ? PAD/SET Patient? No   ?  ? Pain Assessment  ? Currently in Pain? No/denies   ? ?  ?  ? ?  ? ? ? ? ? ?Social History  ? ?Tobacco Use  ?Smoking Status Never  ?Smokeless Tobacco Never  ? ? ?Goals Met:  ?Independence with exercise equipment ?Exercise tolerated well ?No report of concerns or symptoms today ?Strength training completed today ? ?Goals Unmet:  ?Not Applicable ? ?Comments: Pt able to follow exercise prescription today without complaint.  Will continue to monitor for progression. ? ? ? ?Dr. Emily Filbert is Medical Director for [place].  ?Dr. Ottie Glazier is Medical Director for [facility] Pulmonary Rehabilitation. ?

## 2021-12-01 ENCOUNTER — Other Ambulatory Visit: Payer: Self-pay

## 2021-12-01 ENCOUNTER — Encounter: Payer: Medicare PPO | Admitting: *Deleted

## 2021-12-01 DIAGNOSIS — Z955 Presence of coronary angioplasty implant and graft: Secondary | ICD-10-CM

## 2021-12-01 NOTE — Progress Notes (Signed)
Daily Session Note ? ?Patient Details  ?Name: David Jennings ?MRN: [id number] ?Date of Birth: 02-08-1941 ?Referring Provider:   ?Flowsheet Row Cardiac Rehab from 11/23/2021 in [facility] Cardiac and Pulmonary Rehab  ?Referring Provider Lujean Amel MD  ? ?  ? ? ?Encounter Date: 12/01/2021 ? ?Check In: ? Session Check In - 12/01/21 0739   ? ?  ? Check-In  ? Supervising physician immediately available to respond to emergencies See telemetry face sheet for immediately available ER MD   ? Location [facility]-Cardiac & Pulmonary Rehab   ? Staff Present Heath Lark, RN, BSN, CCRP;Joseph [place], [place], [place], [place], [place], CCET   ? Virtual Visit No   ? Medication changes reported     No   ? Fall or balance concerns reported    No   ? Warm-up and Cool-down Performed on first and last piece of equipment   ? Resistance Training Performed Yes   ? VAD Patient? No   ? PAD/SET Patient? No   ?  ? Pain Assessment  ? Currently in Pain? No/denies   ? ?  ?  ? ?  ? ? ? ? ? ?Social History  ? ?Tobacco Use  ?Smoking Status Never  ?Smokeless Tobacco Never  ? ? ?Goals Met:  ?Independence with exercise equipment ?Exercise tolerated well ?No report of concerns or symptoms today ? ?Goals Unmet:  ?Not Applicable ? ?Comments: Pt able to follow exercise prescription today without complaint.  Will continue to monitor for progression. ? ? ? ?Dr. Emily Filbert is Medical Director for [place].  ?Dr. Ottie Glazier is Medical Director for [facility] Pulmonary Rehabilitation. ?

## 2021-12-04 ENCOUNTER — Other Ambulatory Visit: Payer: Self-pay

## 2021-12-04 ENCOUNTER — Encounter: Payer: Medicare PPO | Admitting: *Deleted

## 2021-12-04 DIAGNOSIS — Z955 Presence of coronary angioplasty implant and graft: Secondary | ICD-10-CM

## 2021-12-04 NOTE — Progress Notes (Signed)
Completed initial RD consultation ?

## 2021-12-04 NOTE — Progress Notes (Signed)
Daily Session Note ? ?Patient Details  ?Name: David Jennings ?MRN: [id number] ?Date of Birth: 08/11/1941 ?Referring Provider:   ?Flowsheet Row Cardiac Rehab from 11/23/2021 in [facility] Cardiac and Pulmonary Rehab  ?Referring Provider [place], Dwayne MD  ? ?  ? ? ?Encounter Date: 12/04/2021 ? ?Check In: ? Session Check In - 12/04/21 0806   ? ?  ? Check-In  ? Supervising physician immediately available to respond to emergencies See telemetry face sheet for immediately available ER MD   ? Location [facility]-Cardiac & Pulmonary Rehab   ? Staff Present Susanne Bice, RN, BSN, CCRP;Joseph [place], RCP,RRT,BSRT;Kelly [place], BS, ACSM CEP, Exercise Physiologist   ? Virtual Visit No   ? Medication changes reported     No   ? Fall or balance concerns reported    No   ? Warm-up and Cool-down Performed on first and last piece of equipment   ? Resistance Training Performed Yes   ? VAD Patient? No   ? PAD/SET Patient? No   ?  ? Pain Assessment  ? Currently in Pain? No/denies   ? ?  ?  ? ?  ? ? ? ? ? ?Social History  ? ?Tobacco Use  ?Smoking Status Never  ?Smokeless Tobacco Never  ? ? ?Goals Met:  ?Exercise tolerated well ?No report of concerns or symptoms today ? ?Goals Unmet:  ?Not Applicable ? ?Comments: Pt able to follow exercise prescription today without complaint.  Will continue to monitor for progression. ? ? ? ?Dr. Mark Miller is Medical Director for [facility] Cardiac Rehabilitation.  ?Dr. Fuad Aleskerov is Medical Director for [facility] Pulmonary Rehabilitation. ?

## 2021-12-06 ENCOUNTER — Other Ambulatory Visit: Payer: Self-pay

## 2021-12-06 DIAGNOSIS — Z955 Presence of coronary angioplasty implant and graft: Secondary | ICD-10-CM | POA: Diagnosis not present

## 2021-12-06 NOTE — Progress Notes (Signed)
Daily Session Note ? ?Patient Details  ?Name: David Jennings ?MRN: [id number] ?Date of Birth: July 28, 1941 ?Referring Provider:   ?Flowsheet Row Cardiac Rehab from 11/23/2021 in [facility] Cardiac and Pulmonary Rehab  ?Referring Provider Lujean Amel MD  ? ?  ? ? ?Encounter Date: 12/06/2021 ? ?Check In: ? Session Check In - 12/06/21 0749   ? ?  ? Check-In  ? Supervising physician immediately available to respond to emergencies See telemetry face sheet for immediately available ER MD   ? Location [facility]-Cardiac & Pulmonary Rehab   ? Staff Present Birdie Sons, MPA, RN;Jessica Luan [place], MA, RCEP, CCRP, CCET;Joseph [place], [place]   ? Virtual Visit No   ? Medication changes reported     No   ? Fall or balance concerns reported    No   ? Warm-up and Cool-down Performed on first and last piece of equipment   ? Resistance Training Performed Yes   ? VAD Patient? No   ? PAD/SET Patient? No   ?  ? Pain Assessment  ? Currently in Pain? No/denies   ? ?  ?  ? ?  ? ? ? ? ? ?Social History  ? ?Tobacco Use  ?Smoking Status Never  ?Smokeless Tobacco Never  ? ? ?Goals Met:  ?Independence with exercise equipment ?Exercise tolerated well ?No report of concerns or symptoms today ?Strength training completed today ? ?Goals Unmet:  ?Not Applicable ? ?Comments: Pt able to follow exercise prescription today without complaint.  Will continue to monitor for progression. ? ? ? ?Dr. Emily Filbert is Medical Director for [place].  ?Dr. Ottie Glazier is Medical Director for [facility] Pulmonary Rehabilitation. ?

## 2021-12-08 ENCOUNTER — Other Ambulatory Visit: Payer: Self-pay

## 2021-12-08 ENCOUNTER — Encounter: Payer: Medicare PPO | Admitting: *Deleted

## 2021-12-08 DIAGNOSIS — Z955 Presence of coronary angioplasty implant and graft: Secondary | ICD-10-CM

## 2021-12-08 NOTE — Progress Notes (Signed)
Daily Session Note ? ?Patient Details  ?Name: David Jennings ?MRN: [id number] ?Date of Birth: [birth year] ?Referring Provider:   ?Flowsheet Row Cardiac Rehab from 11/23/2021 in [facility] Cardiac and Pulmonary Rehab  ?Referring Provider Lujean Amel MD  ? ?  ? ? ?Encounter Date: 12/08/2021 ? ?Check In: ? Session Check In - 12/08/21 0752   ? ?  ? Check-In  ? Supervising physician immediately available to respond to emergencies See telemetry face sheet for immediately available ER MD   ? Location [facility]-Cardiac & Pulmonary Rehab   ? Staff Present Heath Lark, RN, BSN, CCRP;Joseph [place], [place], [place], [place], [place], CCET   ? Virtual Visit No   ? Medication changes reported     No   ? Fall or balance concerns reported    No   ? Warm-up and Cool-down Performed on first and last piece of equipment   ? Resistance Training Performed Yes   ? VAD Patient? No   ? PAD/SET Patient? No   ?  ? Pain Assessment  ? Currently in Pain? No/denies   ? ?  ?  ? ?  ? ? ? ? ? ?Social History  ? ?Tobacco Use  ?Smoking Status Never  ?Smokeless Tobacco Never  ? ? ?Goals Met:  ?Independence with exercise equipment ?Exercise tolerated well ?No report of concerns or symptoms today ? ?Goals Unmet:  ?Not Applicable ? ?Comments: Pt able to follow exercise prescription today without complaint.  Will continue to monitor for progression. ? ? ? ?Dr. Emily Filbert is Medical Director for [place].  ?Dr. Ottie Glazier is Medical Director for [facility] Pulmonary Rehabilitation. ?

## 2021-12-11 ENCOUNTER — Other Ambulatory Visit: Payer: Self-pay

## 2021-12-11 ENCOUNTER — Encounter: Payer: Medicare PPO | Admitting: *Deleted

## 2021-12-11 DIAGNOSIS — Z955 Presence of coronary angioplasty implant and graft: Secondary | ICD-10-CM | POA: Diagnosis not present

## 2021-12-11 NOTE — Progress Notes (Signed)
Daily Session Note ? ?Patient Details  ?Name: David Jennings ?MRN: [id number] ?Date of Birth: 13-Sep-1941 ?Referring Provider:   ?Flowsheet Row Cardiac Rehab from 11/23/2021 in [facility] Cardiac and Pulmonary Rehab  ?Referring Provider Lujean Amel MD  ? ?  ? ? ?Encounter Date: 12/11/2021 ? ?Check In: ? Session Check In - 12/11/21 0815   ? ?  ? Check-In  ? Supervising physician immediately available to respond to emergencies See telemetry face sheet for immediately available ER MD   ? Location [facility]-Cardiac & Pulmonary Rehab   ? Staff Present Heath Lark, RN, BSN, CCRP;Joseph [place], RCP,RRT,BSRT;Kelly [place], [place], ACSM CEP, Exercise Physiologist   ? Virtual Visit No   ? Medication changes reported     No   ? Fall or balance concerns reported    No   ? Warm-up and Cool-down Performed on first and last piece of equipment   ? Resistance Training Performed Yes   ? VAD Patient? No   ? PAD/SET Patient? No   ?  ? Pain Assessment  ? Currently in Pain? No/denies   ? ?  ?  ? ?  ? ? ? ? ? ?Social History  ? ?Tobacco Use  ?Smoking Status Never  ?Smokeless Tobacco Never  ? ? ?Goals Met:  ?Independence with exercise equipment ?Exercise tolerated well ?No report of concerns or symptoms today ? ?Goals Unmet:  ?Not Applicable ? ?Comments: Pt able to follow exercise prescription today without complaint.  Will continue to monitor for progression. ? ? ? ?Dr. Emily Filbert is Medical Director for [place].  ?Dr. Ottie Glazier is Medical Director for [facility] Pulmonary Rehabilitation. ?

## 2021-12-13 ENCOUNTER — Other Ambulatory Visit: Payer: Self-pay

## 2021-12-13 DIAGNOSIS — Z955 Presence of coronary angioplasty implant and graft: Secondary | ICD-10-CM

## 2021-12-13 NOTE — Progress Notes (Signed)
Daily Session Note ? ?Patient Details  ?Name: David Jennings ?MRN: [id number] ?Date of Birth: 02/14/1941 ?Referring Provider:   ?Flowsheet Row Cardiac Rehab from 11/23/2021 in [facility] Cardiac and Pulmonary Rehab  ?Referring Provider Lujean Amel MD  ? ?  ? ? ?Encounter Date: 12/13/2021 ? ?Check In: ? Session Check In - 12/13/21 0730   ? ?  ? Check-In  ? Supervising physician immediately available to respond to emergencies See telemetry face sheet for immediately available ER MD   ? Location [facility]-Cardiac & Pulmonary Rehab   ? Staff Present Birdie Sons, MPA, RN;Joseph [place], RCP,RRT,BSRT;Jessica [place], MA, RCEP, CCRP, CCET   ? Virtual Visit No   ? Medication changes reported     No   ? Fall or balance concerns reported    No   ? Warm-up and Cool-down Performed on first and last piece of equipment   ? Resistance Training Performed Yes   ? VAD Patient? No   ? PAD/SET Patient? No   ?  ? Pain Assessment  ? Currently in Pain? No/denies   ? ?  ?  ? ?  ? ? ? ? ? ?Social History  ? ?Tobacco Use  ?Smoking Status Never  ?Smokeless Tobacco Never  ? ? ?Goals Met:  ?Independence with exercise equipment ?Exercise tolerated well ?No report of concerns or symptoms today ?Strength training completed today ? ?Goals Unmet:  ?Not Applicable ? ?Comments: Pt able to follow exercise prescription today without complaint.  Will continue to monitor for progression. ? ? ? ?Dr. Emily Filbert is Medical Director for [place].  ?Dr. Ottie Glazier is Medical Director for [facility] Pulmonary Rehabilitation. ?

## 2021-12-15 ENCOUNTER — Encounter: Payer: Medicare PPO | Admitting: *Deleted

## 2021-12-15 ENCOUNTER — Other Ambulatory Visit: Payer: Self-pay

## 2021-12-15 DIAGNOSIS — Z955 Presence of coronary angioplasty implant and graft: Secondary | ICD-10-CM

## 2021-12-15 NOTE — Progress Notes (Signed)
Daily Session Note ? ?Patient Details  ?Name: David Jennings ?MRN: [id number] ?Date of Birth: [birth year] ?Referring Provider:   ?Flowsheet Row Cardiac Rehab from 11/23/2021 in [facility] Cardiac and Pulmonary Rehab  ?Referring Provider Lujean Amel MD  ? ?  ? ? ?Encounter Date: 12/15/2021 ? ?Check In: ? Session Check In - 12/15/21 0810   ? ?  ? Check-In  ? Supervising physician immediately available to respond to emergencies See telemetry face sheet for immediately available ER MD   ? Location [facility]-Cardiac & Pulmonary Rehab   ? Staff Present Heath Lark, RN, BSN, CCRP;Joseph [place], [place], [place], [place], [place], CCET   ? Virtual Visit No   ? Medication changes reported     No   ? Fall or balance concerns reported    No   ? Warm-up and Cool-down Performed on first and last piece of equipment   ? Resistance Training Performed Yes   ? VAD Patient? No   ? PAD/SET Patient? No   ?  ? Pain Assessment  ? Currently in Pain? No/denies   ? ?  ?  ? ?  ? ? ? ? ? ?Social History  ? ?Tobacco Use  ?Smoking Status Never  ?Smokeless Tobacco Never  ? ? ?Goals Met:  ?Independence with exercise equipment ?Exercise tolerated well ?No report of concerns or symptoms today ? ?Goals Unmet:  ?Not Applicable ? ?Comments: Pt able to follow exercise prescription today without complaint.  Will continue to monitor for progression. ? ? ? ?Dr. Emily Filbert is Medical Director for [place].  ?Dr. Ottie Glazier is Medical Director for [facility] Pulmonary Rehabilitation. ?

## 2021-12-18 ENCOUNTER — Encounter: Payer: Medicare PPO | Admitting: *Deleted

## 2021-12-18 ENCOUNTER — Other Ambulatory Visit: Payer: Self-pay

## 2021-12-18 DIAGNOSIS — Z955 Presence of coronary angioplasty implant and graft: Secondary | ICD-10-CM | POA: Diagnosis not present

## 2021-12-18 NOTE — Progress Notes (Signed)
Daily Session Note ? ?Patient Details  ?Name: David Jennings ?MRN: [id number] ?Date of Birth: 04/12/1941 ?Referring Provider:   ?Flowsheet Row Cardiac Rehab from 11/23/2021 in [facility] Cardiac and Pulmonary Rehab  ?Referring Provider Lujean Amel MD  ? ?  ? ? ?Encounter Date: 12/18/2021 ? ?Check In: ? Session Check In - 12/18/21 0805   ? ?  ? Check-In  ? Supervising physician immediately available to respond to emergencies See telemetry face sheet for immediately available ER MD   ? Location [facility]-Cardiac & Pulmonary Rehab   ? Staff Present Heath Lark, RN, BSN, [place], BS, ACSM CEP, Exercise Physiologist;Joseph [place], [place]   ? Virtual Visit No   ? Medication changes reported     No   ? Fall or balance concerns reported    No   ? Warm-up and Cool-down Performed on first and last piece of equipment   ? Resistance Training Performed Yes   ? VAD Patient? No   ? PAD/SET Patient? No   ?  ? Pain Assessment  ? Currently in Pain? No/denies   ? ?  ?  ? ?  ? ? ? ? ? ?Social History  ? ?Tobacco Use  ?Smoking Status Never  ?Smokeless Tobacco Never  ? ? ?Goals Met:  ?Independence with exercise equipment ?Exercise tolerated well ?No report of concerns or symptoms today ? ?Goals Unmet:  ?Not Applicable ? ?Comments: Pt able to follow exercise prescription today without complaint.  Will continue to monitor for progression. ? ? ? ?Dr. Emily Filbert is Medical Director for [place].  ?Dr. Ottie Glazier is Medical Director for [facility] Pulmonary Rehabilitation. ?

## 2021-12-20 ENCOUNTER — Encounter: Payer: Self-pay | Admitting: *Deleted

## 2021-12-20 ENCOUNTER — Other Ambulatory Visit: Payer: Self-pay

## 2021-12-20 DIAGNOSIS — Z955 Presence of coronary angioplasty implant and graft: Secondary | ICD-10-CM

## 2021-12-20 NOTE — Progress Notes (Signed)
Cardiac Individual Treatment Plan ? ?Patient Details  ?Name: David Jennings ?MRN: [id number] ?Date of Birth: Oct 01, 1941 ?Referring Provider:   ?Flowsheet Row Cardiac Rehab from 11/23/2021 in [facility] Cardiac and Pulmonary Rehab  ?Referring Provider Lujean Amel MD  ? ?  ? ? ?Initial Encounter Date:  ?Flowsheet Row Cardiac Rehab from 11/23/2021 in [facility] Cardiac and Pulmonary Rehab  ?Date 11/23/21  ? ?  ? ? ?Visit Diagnosis: Status post coronary artery stent placement ? ?Patient's Home Medications on Admission: ? ?Current Outpatient Medications:  ?  acetaminophen (TYLENOL) 500 MG tablet, Take 2 tablets (1,000 mg total) by mouth every 6 (six) hours. (Patient not taking: Reported on 10/02/2021), Disp: 30 tablet, Rfl: 0 ?  albuterol (VENTOLIN HFA) 108 (90 Base) MCG/ACT inhaler, Inhale 2 puffs into the lungs every 6 (six) hours as needed for wheezing. Only takes PRN, Disp: , Rfl:  ?  amLODipine (NORVASC) 5 MG tablet, Take 5 mg by mouth daily., Disp: , Rfl:  ?  atenolol (TENORMIN) 25 MG tablet, Take 25 mg by mouth daily., Disp: , Rfl:  ?  atorvastatin (LIPITOR) 40 MG tablet, Take 1 tablet (40 mg total) by mouth daily at 6 PM., Disp: 30 tablet, Rfl: 0 ?  atorvastatin (LIPITOR) 40 MG tablet, Take 1 tablet (40 mg total) by mouth daily., Disp: , Rfl:  ?  Budeson-Glycopyrrol-Formoterol (BREZTRI AEROSPHERE) 160-9-4.8 MCG/ACT AERO, Inhale 2 puffs into the lungs in the morning and at bedtime., Disp: , Rfl:  ?  celecoxib (CELEBREX) 200 MG capsule, Take 1 capsule (200 mg total) by mouth every 12 (twelve) hours. (Patient not taking: Reported on 10/02/2021), Disp: 60 capsule, Rfl: 0 ?  clopidogrel (PLAVIX) 75 MG tablet, Take 75 mg by mouth daily., Disp: , Rfl:  ?  Docusate Calcium (STOOL SOFTENER PO), Take 1 tablet by mouth every other day., Disp: , Rfl:  ?  dupilumab (DUPIXENT) 300 MG/2ML prefilled syringe, Inject 300 mg into the skin every 14 (fourteen) days., Disp: , Rfl:  ?  fluocinonide cream (LIDEX) 8.24 %, Apply 1 application  topically daily as needed (irritation)., Disp: , Rfl:  ?  fluticasone (FLONASE) 50 MCG/ACT nasal spray, Place 2 sprays into both nostrils daily., Disp: , Rfl:  ?  hydrochlorothiazide (HYDRODIURIL) 25 MG tablet, Take 1 tablet (25 mg total) by mouth daily. (Patient not taking: Reported on 11/09/2021), Disp: , Rfl:  ?  ipratropium (ATROVENT HFA) 17 MCG/ACT inhaler, Inhale 2 puffs into the lungs every 6 (six) hours as needed for wheezing., Disp: , Rfl:  ?  ipratropium-albuterol (DUONEB) 0.5-2.5 (3) MG/3ML SOLN, Inhale 3 mLs into the lungs every 6 (six) hours as needed (shortness of breath or wheezing)., Disp: , Rfl:  ?  losartan (COZAAR) 100 MG tablet, Take 1 tablet (100 mg total) by mouth daily. (Patient not taking: Reported on 10/02/2021), Disp: , Rfl:  ?  losartan-hydrochlorothiazide (HYZAAR) 100-25 MG tablet, Take 1 tablet by mouth daily., Disp: , Rfl:  ?  methocarbamol (ROBAXIN) 500 MG tablet, Take 1 tablet (500 mg total) by mouth every 6 (six) hours as needed for muscle spasms. (Patient not taking: Reported on 10/02/2021), Disp: 60 tablet, Rfl: 0 ?  montelukast (SINGULAIR) 10 MG tablet, Take 10 mg by mouth daily., Disp: , Rfl:  ?  Multiple Vitamin (MULTIVITAMIN WITH MINERALS) TABS tablet, Take 1 tablet by mouth daily., Disp: , Rfl:  ?  nitroGLYCERIN (NITROSTAT) 0.4 MG SL tablet, Place under the tongue., Disp: , Rfl:  ?  oxybutynin (DITROPAN) 5 MG tablet, Take 5  mg by mouth 2 (two) times daily., Disp: , Rfl:  ?  prednisoLONE acetate (PRED FORTE) 1 % ophthalmic suspension, Place 1 drop into both eyes as needed., Disp: , Rfl:  ?  senna (SENOKOT) 8.6 MG TABS tablet, Take 1 tablet (8.6 mg total) by mouth 2 (two) times daily. (Patient not taking: Reported on 10/02/2021), Disp: 120 tablet, Rfl: 0 ? ?Past Medical History: ?Past Medical History:  ?Diagnosis Date  ? Arthritis   ? Asthma   ? Cancer [facility]) 2010  ? prostate CA  ? COVID-19   ? Dyspnea   ? Eczema   ? Hypertension   ? Pneumonia   ? Sleep apnea   ? uses cpap  ? Stroke  [facility])   ? ? ?Tobacco Use: ?Social History  ? ?Tobacco Use  ?Smoking Status Never  ?Smokeless Tobacco Never  ? ? ?Labs: ?Recent Review Flowsheet Data   ? ? Labs for [facility] Cardiac and Pulmonary Rehab Latest Ref Rng & Units 10/06/2017  ? Cholestrol 0 - 200 mg/dL 166  ? LDLCALC 0 - 99 mg/dL 103(H)  ? HDL >40 mg/dL 26(L)  ? Trlycerides <150 mg/dL 184(H)  ? ?  ? ? ? ?Exercise Target Goals: ?Exercise Program Goal: ?Individual exercise prescription set using results from initial 6 min walk test and THRR while considering  patient?s activity barriers and safety.  ? ?Exercise Prescription Goal: ?Initial exercise prescription builds to 30-45 minutes a day of aerobic activity, 2-3 days per week.  Home exercise guidelines will be given to patient during program as part of exercise prescription that the participant will acknowledge. ? ? ?Education: Aerobic Exercise: ?- Group verbal and visual presentation on the components of exercise prescription. Introduces F.I.T.T principle from ACSM for exercise prescriptions.  Reviews F.I.T.T. principles of aerobic exercise including progression. Written material given at graduation. ?Flowsheet Row Cardiac Rehab from 12/13/2021 in [facility] Cardiac and Pulmonary Rehab  ?Date 11/29/21  ?Educator [facility]  ?Instruction Review Code 1- Verbalizes Understanding  ? ?  ? ? ?Education: Resistance Exercise: ?- Group verbal and visual presentation on the components of exercise prescription. Introduces F.I.T.T principle from ACSM for exercise prescriptions  Reviews F.I.T.T. principles of resistance exercise including progression. Written material given at graduation. ?Flowsheet Row Cardiac Rehab from 12/13/2021 in [facility] Cardiac and Pulmonary Rehab  ?Date 12/06/21  ?Educator AS  ?Instruction Review Code 1- Verbalizes Understanding  ? ?  ? ?  ?Education: Exercise & Equipment Safety: ?- Individual verbal instruction and demonstration of equipment use and safety with use of the equipment. ?Flowsheet Row Cardiac Rehab from  12/13/2021 in [facility] Cardiac and Pulmonary Rehab  ?Education need identified 11/23/21  ?Date 11/23/21  ?Educator KL  ?Instruction Review Code 1- Verbalizes Understanding  ? ?  ? ? ?Education: Exercise Physiology & General Exercise Guidelines: ?- Group verbal and written instruction with models to review the exercise physiology of the cardiovascular system and associated critical values. Provides general exercise guidelines with specific guidelines to those with heart or lung disease.  ?Flowsheet Row Cardiac Rehab from 12/13/2021 in [facility] Cardiac and Pulmonary Rehab  ?Education need identified 11/23/21  ? ?  ? ? ?Education: Flexibility, Balance, Mind/Body Relaxation: ?- Group verbal and visual presentation with interactive activity on the components of exercise prescription. Introduces F.I.T.T principle from ACSM for exercise prescriptions. Reviews F.I.T.T. principles of flexibility and balance exercise training including progression. Also discusses the mind body connection.  Reviews various relaxation techniques to help reduce and manage stress (i.e. Deep breathing, progressive muscle relaxation,  and visualization). Balance handout provided to take home. Written material given at graduation. ? ? ?Activity Barriers & Risk Stratification: ? Activity Barriers & Cardiac Risk Stratification - 11/23/21 1508   ? ?  ? Activity Barriers & Cardiac Risk Stratification  ? Activity Barriers Right Knee Replacement;Left Knee Replacement;Joint Problems;Other (comment);Deconditioning;Muscular Weakness   ? Comments hip pain, surgery to help. started walking again outside- asthma flared.  Right shoulder problem-rotator cuff shot   ? Cardiac Risk Stratification High   ? ?  ?  ? ?  ? ? ?6 Minute Walk: ? 6 Minute Walk   ? ? [place] Name 11/23/21 1515  ?  ?  ?  ? 6 Minute Walk  ? Phase Initial    ? Distance 1212 feet    ? Walk Time 6 minutes    ? # of Rest Breaks 0    ? MPH 2.29    ? METS 1.56    ? RPE 11    ? Perceived Dyspnea  0    ? VO2 Peak  5.47    ? Symptoms No    ? Resting HR 64 bpm    ? Resting BP 124/68    ? Resting Oxygen Saturation  94 %    ? Exercise Oxygen Saturation  during 6 min walk 89 %    ? Max Ex. HR 93 bpm    ? Max Ex. BP 142/68    ? 2

## 2021-12-20 NOTE — Progress Notes (Signed)
Daily Session Note ? ?Patient Details  ?Name: David Jennings ?MRN: [id number] ?Date of Birth: September 22, 1941 ?Referring Provider:   ?Flowsheet Row Cardiac Rehab from 11/23/2021 in [facility] Cardiac and Pulmonary Rehab  ?Referring Provider Lujean Amel MD  ? ?  ? ? ?Encounter Date: 12/20/2021 ? ?Check In: ? Session Check In - 12/20/21 0735   ? ?  ? Check-In  ? Supervising physician immediately available to respond to emergencies See telemetry face sheet for immediately available ER MD   ? Location [facility]-Cardiac & Pulmonary Rehab   ? Staff Present Birdie Sons, MPA, RN;Melissa [employer], RDN, LDN;Joseph [place], RCP,RRT,BSRT   ? Virtual Visit No   ? Medication changes reported     No   ? Fall or balance concerns reported    No   ? Warm-up and Cool-down Performed on first and last piece of equipment   ? Resistance Training Performed Yes   ? VAD Patient? No   ? PAD/SET Patient? No   ?  ? Pain Assessment  ? Currently in Pain? No/denies   ? ?  ?  ? ?  ? ? ? ? ? ?Social History  ? ?Tobacco Use  ?Smoking Status Never  ?Smokeless Tobacco Never  ? ? ?Goals Met:  ?Independence with exercise equipment ?Exercise tolerated well ?No report of concerns or symptoms today ?Strength training completed today ? ?Goals Unmet:  ?Not Applicable ? ?Comments: Pt able to follow exercise prescription today without complaint.  Will continue to monitor for progression. ? ? ? ?Dr. Emily Filbert is Medical Director for [place].  ?Dr. Ottie Glazier is Medical Director for [facility] Pulmonary Rehabilitation. ?

## 2021-12-22 ENCOUNTER — Other Ambulatory Visit: Payer: Self-pay

## 2021-12-22 ENCOUNTER — Encounter: Payer: Medicare PPO | Admitting: *Deleted

## 2021-12-22 DIAGNOSIS — Z955 Presence of coronary angioplasty implant and graft: Secondary | ICD-10-CM | POA: Diagnosis not present

## 2021-12-22 NOTE — Progress Notes (Signed)
Daily Session Note ? ?Patient Details  ?Name: David Jennings ?MRN: [id number] ?Date of Birth: 08-26-1941 ?Referring Provider:   ?Flowsheet Row Cardiac Rehab from 11/23/2021 in [facility] Cardiac and Pulmonary Rehab  ?Referring Provider Lujean Amel MD  ? ?  ? ? ?Encounter Date: 12/22/2021 ? ?Check In: ? Session Check In - 12/22/21 0814   ? ?  ? Check-In  ? Supervising physician immediately available to respond to emergencies See telemetry face sheet for immediately available ER MD   ? Location [facility]-Cardiac & Pulmonary Rehab   ? Staff Present Heath Lark, RN, BSN, CCRP;Jessica Callaway, MA, RCEP, CCRP, CCET;Joseph [place], [place]   ? Virtual Visit No   ? Medication changes reported     No   ? Fall or balance concerns reported    No   ? Warm-up and Cool-down Performed on first and last piece of equipment   ? Resistance Training Performed Yes   ? VAD Patient? No   ? PAD/SET Patient? No   ?  ? Pain Assessment  ? Currently in Pain? No/denies   ? ?  ?  ? ?  ? ? ? ? ? ?Social History  ? ?Tobacco Use  ?Smoking Status Never  ?Smokeless Tobacco Never  ? ? ?Goals Met:  ?Independence with exercise equipment ?Exercise tolerated well ?No report of concerns or symptoms today ? ?Goals Unmet:  ?Not Applicable ? ?Comments: Pt able to follow exercise prescription today without complaint.  Will continue to monitor for progression. ? ? ? ?Dr. Emily Filbert is Medical Director for [place].  ?Dr. Ottie Glazier is Medical Director for [facility] Pulmonary Rehabilitation. ?

## 2021-12-25 ENCOUNTER — Encounter: Payer: Medicare PPO | Admitting: *Deleted

## 2021-12-25 ENCOUNTER — Other Ambulatory Visit: Payer: Self-pay

## 2021-12-25 DIAGNOSIS — Z955 Presence of coronary angioplasty implant and graft: Secondary | ICD-10-CM

## 2021-12-25 NOTE — Progress Notes (Signed)
Daily Session Note ? ?Patient Details  ?Name: David Jennings ?MRN: [id number] ?Date of Birth: 1941/07/28 ?Referring Provider:   ?Flowsheet Row Cardiac Rehab from 11/23/2021 in [facility] Cardiac and Pulmonary Rehab  ?Referring Provider Lujean Amel MD  ? ?  ? ? ?Encounter Date: 12/25/2021 ? ?Check In: ? Session Check In - 12/25/21 0806   ? ?  ? Check-In  ? Supervising physician immediately available to respond to emergencies See telemetry face sheet for immediately available ER MD   ? Location [facility]-Cardiac & Pulmonary Rehab   ? Staff Present Heath Lark, RN, BSN, CCRP;Joseph [place], RCP,RRT,BSRT;Kelly [place], [place], ACSM CEP, Exercise Physiologist   ? Virtual Visit No   ? Medication changes reported     No   ? Fall or balance concerns reported    No   ? Warm-up and Cool-down Performed on first and last piece of equipment   ? Resistance Training Performed Yes   ? VAD Patient? No   ? PAD/SET Patient? No   ?  ? Pain Assessment  ? Currently in Pain? No/denies   ? ?  ?  ? ?  ? ? ? ? ? ?Social History  ? ?Tobacco Use  ?Smoking Status Never  ?Smokeless Tobacco Never  ? ? ?Goals Met:  ?Independence with exercise equipment ?Exercise tolerated well ?No report of concerns or symptoms today ? ?Goals Unmet:  ?Not Applicable ? ?Comments: Pt able to follow exercise prescription today without complaint.  Will continue to monitor for progression. ? ? ? ?Dr. Emily Filbert is Medical Director for [place].  ?Dr. Ottie Glazier is Medical Director for [facility] Pulmonary Rehabilitation. ?

## 2021-12-27 ENCOUNTER — Other Ambulatory Visit: Payer: Self-pay

## 2021-12-27 DIAGNOSIS — Z955 Presence of coronary angioplasty implant and graft: Secondary | ICD-10-CM | POA: Diagnosis not present

## 2021-12-27 NOTE — Progress Notes (Signed)
Daily Session Note ? ?Patient Details  ?Name: David Jennings ?MRN: [id number] ?Date of Birth: 1940-11-02 ?Referring Provider:   ?Flowsheet Row Cardiac Rehab from 11/23/2021 in [facility] Cardiac and Pulmonary Rehab  ?Referring Provider Lujean Amel MD  ? ?  ? ? ?Encounter Date: 12/27/2021 ? ?Check In: ? Session Check In - 12/27/21 0724   ? ?  ? Check-In  ? Supervising physician immediately available to respond to emergencies See telemetry face sheet for immediately available ER MD   ? Location [facility]-Cardiac & Pulmonary Rehab   ? Staff Present Birdie Sons, MPA, RN;Jessica [place], MA, RCEP, CCRP, CCET;Amanda Sommer, BA, ACSM CEP, Exercise Physiologist;Joseph [place], [place]   ? Virtual Visit No   ? Medication changes reported     No   ? Fall or balance concerns reported    No   ? Warm-up and Cool-down Performed on first and last piece of equipment   ? Resistance Training Performed Yes   ? VAD Patient? No   ? PAD/SET Patient? No   ?  ? Pain Assessment  ? Currently in Pain? No/denies   ? ?  ?  ? ?  ? ? ? ? ? ?Social History  ? ?Tobacco Use  ?Smoking Status Never  ?Smokeless Tobacco Never  ? ? ?Goals Met:  ?Independence with exercise equipment ?Exercise tolerated well ?No report of concerns or symptoms today ?Strength training completed today ? ?Goals Unmet:  ?Not Applicable ? ?Comments: Pt able to follow exercise prescription today without complaint.  Will continue to monitor for progression. ? ? ? ?Dr. Emily Filbert is Medical Director for [place].  ?Dr. Ottie Glazier is Medical Director for [facility] Pulmonary Rehabilitation. ?

## 2021-12-29 ENCOUNTER — Encounter: Payer: Medicare PPO | Admitting: *Deleted

## 2021-12-29 DIAGNOSIS — Z955 Presence of coronary angioplasty implant and graft: Secondary | ICD-10-CM

## 2021-12-29 NOTE — Progress Notes (Signed)
Daily Session Note ? ?Patient Details  ?Name: David Jennings ?MRN: [id number] ?Date of Birth: [birth year] ?Referring Provider:   ?Flowsheet Row Cardiac Rehab from 11/23/2021 in [facility] Cardiac and Pulmonary Rehab  ?Referring Provider Lujean Amel MD  ? ?  ? ? ?Encounter Date: 12/29/2021 ? ?Check In: ? Session Check In - 12/29/21 0810   ? ?  ? Check-In  ? Supervising physician immediately available to respond to emergencies See telemetry face sheet for immediately available ER MD   ? Location [facility]-Cardiac & Pulmonary Rehab   ? Staff Present Hope Budds, RDN, LDN;Nicky Kras, RN, BSN, CCRP;Joseph [place], RCP,RRT,BSRT   ? Virtual Visit No   ? Medication changes reported     No   ? Fall or balance concerns reported    No   ? Warm-up and Cool-down Performed on first and last piece of equipment   ? Resistance Training Performed Yes   ? VAD Patient? No   ? PAD/SET Patient? No   ?  ? Pain Assessment  ? Currently in Pain? No/denies   ? ?  ?  ? ?  ? ? ? ? ? ?Social History  ? ?Tobacco Use  ?Smoking Status Never  ?Smokeless Tobacco Never  ? ? ?Goals Met:  ?Independence with exercise equipment ?Exercise tolerated well ?No report of concerns or symptoms today ? ?Goals Unmet:  ?Not Applicable ? ?Comments: Pt able to follow exercise prescription today without complaint.  Will continue to monitor for progression. ? ? ? ?Dr. Emily Filbert is Medical Director for [place].  ?Dr. Ottie Glazier is Medical Director for [facility] Pulmonary Rehabilitation. ?

## 2022-01-02 ENCOUNTER — Encounter: Payer: Medicare PPO | Attending: Internal Medicine

## 2022-01-02 DIAGNOSIS — Z4889 Encounter for other specified surgical aftercare: Secondary | ICD-10-CM | POA: Insufficient documentation

## 2022-01-02 DIAGNOSIS — Z955 Presence of coronary angioplasty implant and graft: Secondary | ICD-10-CM | POA: Insufficient documentation

## 2022-01-02 NOTE — Progress Notes (Signed)
Completed follow up RD appointment ?

## 2022-01-03 DIAGNOSIS — Z955 Presence of coronary angioplasty implant and graft: Secondary | ICD-10-CM

## 2022-01-03 DIAGNOSIS — Z4889 Encounter for other specified surgical aftercare: Secondary | ICD-10-CM | POA: Diagnosis not present

## 2022-01-03 NOTE — Progress Notes (Signed)
Daily Session Note ? ?Patient Details  ?Name: David Jennings ?MRN: [id number] ?Date of Birth: [birth year] ?Referring Provider:   ?Flowsheet Row Cardiac Rehab from 11/23/2021 in [facility] Cardiac and Pulmonary Rehab  ?Referring Provider Lujean Amel MD  ? ?  ? ? ?Encounter Date: 01/03/2022 ? ?Check In: ? Session Check In - 01/03/22 0729   ? ?  ? Check-In  ? Supervising physician immediately available to respond to emergencies See telemetry face sheet for immediately available ER MD   ? Location [facility]-Cardiac & Pulmonary Rehab   ? Staff Present Birdie Sons, MPA, RN;Jessica Luan [place], MA, RCEP, CCRP, CCET;Joseph [place], [place]   ? Virtual Visit No   ? Medication changes reported     No   ? Fall or balance concerns reported    No   ? Warm-up and Cool-down Performed on first and last piece of equipment   ? Resistance Training Performed Yes   ? VAD Patient? No   ? PAD/SET Patient? No   ?  ? Pain Assessment  ? Currently in Pain? No/denies   ? ?  ?  ? ?  ? ? ? ? ? ?Social History  ? ?Tobacco Use  ?Smoking Status Never  ?Smokeless Tobacco Never  ? ? ?Goals Met:  ?Independence with exercise equipment ?Exercise tolerated well ?No report of concerns or symptoms today ?Strength training completed today ? ?Goals Unmet:  ?Not Applicable ? ?Comments: Pt able to follow exercise prescription today without complaint.  Will continue to monitor for progression. ? ? ? ?Dr. Emily Filbert is Medical Director for [place].  ?Dr. Ottie Glazier is Medical Director for [facility] Pulmonary Rehabilitation. ?

## 2022-01-05 ENCOUNTER — Encounter: Payer: Medicare PPO | Admitting: *Deleted

## 2022-01-05 DIAGNOSIS — Z955 Presence of coronary angioplasty implant and graft: Secondary | ICD-10-CM

## 2022-01-05 DIAGNOSIS — Z4889 Encounter for other specified surgical aftercare: Secondary | ICD-10-CM | POA: Diagnosis not present

## 2022-01-05 NOTE — Progress Notes (Signed)
Daily Session Note ? ?Patient Details  ?Name: David Jennings ?MRN: [id number] ?Date of Birth: 1941/06/27 ?Referring Provider:   ?Flowsheet Row Cardiac Rehab from 11/23/2021 in [facility] Cardiac and Pulmonary Rehab  ?Referring Provider Lujean Amel MD  ? ?  ? ? ?Encounter Date: 01/05/2022 ? ?Check In: ? Session Check In - 01/05/22 0818   ? ?  ? Check-In  ? Supervising physician immediately available to respond to emergencies See telemetry face sheet for immediately available ER MD   ? Location [facility]-Cardiac & Pulmonary Rehab   ? Staff Present Heath Lark, RN, BSN, CCRP;Laureen Owens Shark, BS, RRT, CPFT;Joseph [place], [place]   ? Virtual Visit No   ? Medication changes reported     No   ? Fall or balance concerns reported    No   ? Warm-up and Cool-down Performed on first and last piece of equipment   ? Resistance Training Performed Yes   ? VAD Patient? No   ? PAD/SET Patient? No   ?  ? Pain Assessment  ? Currently in Pain? No/denies   ? ?  ?  ? ?  ? ? ? ? ? ?Social History  ? ?Tobacco Use  ?Smoking Status Never  ?Smokeless Tobacco Never  ? ? ?Goals Met:  ?Independence with exercise equipment ?Exercise tolerated well ?No report of concerns or symptoms today ? ?Goals Unmet:  ?Not Applicable ? ?Comments: Pt able to follow exercise prescription today without complaint.  Will continue to monitor for progression. ? ? ? ?Dr. Emily Filbert is Medical Director for [place].  ?Dr. Ottie Glazier is Medical Director for [facility] Pulmonary Rehabilitation. ?

## 2022-01-08 ENCOUNTER — Encounter: Payer: Medicare PPO | Admitting: *Deleted

## 2022-01-08 DIAGNOSIS — Z4889 Encounter for other specified surgical aftercare: Secondary | ICD-10-CM | POA: Diagnosis not present

## 2022-01-08 DIAGNOSIS — Z955 Presence of coronary angioplasty implant and graft: Secondary | ICD-10-CM

## 2022-01-08 NOTE — Progress Notes (Signed)
Daily Session Note ? ?Patient Details  ?Name: David Jennings ?MRN: [id number] ?Date of Birth: [birth year] ?Referring Provider:   ?Flowsheet Row Cardiac Rehab from 11/23/2021 in [facility] Cardiac and Pulmonary Rehab  ?Referring Provider Lujean Amel MD  ? ?  ? ? ?Encounter Date: 01/08/2022 ? ?Check In: ? Session Check In - 01/08/22 0804   ? ?  ? Check-In  ? Supervising physician immediately available to respond to emergencies See telemetry face sheet for immediately available ER MD   ? Staff Present Heath Lark, RN, BSN, CCRP;Joseph [place], RCP,RRT,BSRT;Kelly [place], BS, ACSM CEP, Exercise Physiologist;Laureen Owens Shark, BS, RRT, CPFT   ? Virtual Visit No   ? Medication changes reported     No   ? Fall or balance concerns reported    No   ? Warm-up and Cool-down Performed on first and last piece of equipment   ? Resistance Training Performed Yes   ? VAD Patient? No   ? PAD/SET Patient? No   ?  ? Pain Assessment  ? Currently in Pain? No/denies   ? ?  ?  ? ?  ? ? ? ? ? ?Social History  ? ?Tobacco Use  ?Smoking Status Never  ?Smokeless Tobacco Never  ? ? ?Goals Met:  ?Independence with exercise equipment ?Exercise tolerated well ?No report of concerns or symptoms today ? ?Goals Unmet:  ?Not Applicable ? ?Comments: Pt able to follow exercise prescription today without complaint.  Will continue to monitor for progression. ? ? ? ?Dr. Emily Filbert is Medical Director for [place].  ?Dr. Ottie Glazier is Medical Director for [facility] Pulmonary Rehabilitation. ?

## 2022-01-10 ENCOUNTER — Encounter: Payer: Medicare PPO | Admitting: *Deleted

## 2022-01-10 DIAGNOSIS — Z955 Presence of coronary angioplasty implant and graft: Secondary | ICD-10-CM

## 2022-01-10 DIAGNOSIS — Z4889 Encounter for other specified surgical aftercare: Secondary | ICD-10-CM | POA: Diagnosis not present

## 2022-01-10 NOTE — Progress Notes (Signed)
Daily Session Note ? ?Patient Details  ?Name: David Jennings ?MRN: [id number] ?Date of Birth: [birth year] ?Referring Provider:   ?Flowsheet Row Cardiac Rehab from 11/23/2021 in [facility] Cardiac and Pulmonary Rehab  ?Referring Provider Lujean Amel MD  ? ?  ? ? ?Encounter Date: 01/10/2022 ? ?Check In: ? Session Check In - 01/10/22 0926   ? ?  ? Check-In  ? Supervising physician immediately available to respond to emergencies See telemetry face sheet for immediately available ER MD   ? Location [facility]-Cardiac & Pulmonary Rehab   ? Staff Present Heath Lark, RN, BSN, CCRP;Amanda Sommer, BA, ACSM CEP, Exercise Physiologist;Melissa [place], RDN, LDN   ? Virtual Visit No   ? Medication changes reported     No   ? Fall or balance concerns reported    No   ? Warm-up and Cool-down Performed on first and last piece of equipment   ? Resistance Training Performed Yes   ? VAD Patient? No   ? PAD/SET Patient? No   ?  ? Pain Assessment  ? Currently in Pain? No/denies   ? ?  ?  ? ?  ? ? ? ? ? ?Social History  ? ?Tobacco Use  ?Smoking Status Never  ?Smokeless Tobacco Never  ? ? ?Goals Met:  ?Independence with exercise equipment ?Exercise tolerated well ?No report of concerns or symptoms today ? ?Goals Unmet:  ?Not Applicable ? ?Comments: Pt able to follow exercise prescription today without complaint.  Will continue to monitor for progression. ? ? ? ?Dr. Emily Filbert is Medical Director for [place].  ?Dr. Ottie Glazier is Medical Director for [facility] Pulmonary Rehabilitation. ?

## 2022-01-12 ENCOUNTER — Encounter: Payer: Medicare PPO | Admitting: *Deleted

## 2022-01-12 DIAGNOSIS — Z955 Presence of coronary angioplasty implant and graft: Secondary | ICD-10-CM

## 2022-01-12 DIAGNOSIS — Z4889 Encounter for other specified surgical aftercare: Secondary | ICD-10-CM | POA: Diagnosis not present

## 2022-01-12 NOTE — Progress Notes (Signed)
Daily Session Note ? ?Patient Details  ?Name: David Jennings ?MRN: [id number] ?Date of Birth: 1940/11/21 ?Referring Provider:   ?Flowsheet Row Cardiac Rehab from 11/23/2021 in [facility] Cardiac and Pulmonary Rehab  ?Referring Provider Lujean Amel MD  ? ?  ? ? ?Encounter Date: 01/12/2022 ? ?Check In: ? Session Check In - 01/12/22 0828   ? ?  ? Check-In  ? Supervising physician immediately available to respond to emergencies See telemetry face sheet for immediately available ER MD   ? Location [facility]-Cardiac & Pulmonary Rehab   ? Staff Present Heath Lark, RN, BSN, CCRP;Melissa Callao, RDN, LDN;Joseph [place], [place]   ? Virtual Visit No   ? Medication changes reported     No   ? Fall or balance concerns reported    No   ? Warm-up and Cool-down Performed on first and last piece of equipment   ? Resistance Training Performed Yes   ? VAD Patient? No   ? PAD/SET Patient? No   ?  ? Pain Assessment  ? Currently in Pain? No/denies   ? ?  ?  ? ?  ? ? ? ? ? ?Social History  ? ?Tobacco Use  ?Smoking Status Never  ?Smokeless Tobacco Never  ? ? ?Goals Met:  ?Independence with exercise equipment ?Exercise tolerated well ?No report of concerns or symptoms today ? ?Goals Unmet:  ?Not Applicable ? ?Comments: Pt able to follow exercise prescription today without complaint.  Will continue to monitor for progression. ? ? ? ?Dr. Emily Filbert is Medical Director for [place].  ?Dr. Ottie Glazier is Medical Director for [facility] Pulmonary Rehabilitation. ?

## 2022-01-15 ENCOUNTER — Encounter: Payer: Medicare PPO | Admitting: *Deleted

## 2022-01-15 DIAGNOSIS — Z955 Presence of coronary angioplasty implant and graft: Secondary | ICD-10-CM

## 2022-01-15 DIAGNOSIS — Z4889 Encounter for other specified surgical aftercare: Secondary | ICD-10-CM | POA: Diagnosis not present

## 2022-01-15 NOTE — Progress Notes (Signed)
Daily Session Note ? ?Patient Details  ?Name: David Jennings ?MRN: [id number] ?Date of Birth: 07/27/1941 ?Referring Provider:   ?Flowsheet Row Cardiac Rehab from 11/23/2021 in [facility] Cardiac and Pulmonary Rehab  ?Referring Provider Lujean Amel MD  ? ?  ? ? ?Encounter Date: 01/15/2022 ? ?Check In: ? Session Check In - 01/15/22 0820   ? ?  ? Check-In  ? Supervising physician immediately available to respond to emergencies See telemetry face sheet for immediately available ER MD   ? Location [facility]-Cardiac & Pulmonary Rehab   ? Staff Present Heath Lark, RN, BSN, Laveda Norman, BS, ACSM CEP, Exercise Physiologist;Joseph [place], [place]   ? Virtual Visit No   ? Medication changes reported     No   ? Fall or balance concerns reported    No   ? Warm-up and Cool-down Performed on first and last piece of equipment   ? Resistance Training Performed Yes   ? VAD Patient? No   ? PAD/SET Patient? No   ?  ? Pain Assessment  ? Currently in Pain? No/denies   ? ?  ?  ? ?  ? ? ? ? ? ?Social History  ? ?Tobacco Use  ?Smoking Status Never  ?Smokeless Tobacco Never  ? ? ?Goals Met:  ?Independence with exercise equipment ?Exercise tolerated well ?No report of concerns or symptoms today ? ?Goals Unmet:  ?Not Applicable ? ?Comments: Pt able to follow exercise prescription today without complaint.  Will continue to monitor for progression. ? ? ? ?Dr. Emily Filbert is Medical Director for [place].  ?Dr. Ottie Glazier is Medical Director for [facility] Pulmonary Rehabilitation. ?

## 2022-01-17 ENCOUNTER — Encounter: Payer: Self-pay | Admitting: *Deleted

## 2022-01-17 DIAGNOSIS — Z4889 Encounter for other specified surgical aftercare: Secondary | ICD-10-CM | POA: Diagnosis not present

## 2022-01-17 DIAGNOSIS — Z955 Presence of coronary angioplasty implant and graft: Secondary | ICD-10-CM

## 2022-01-17 NOTE — Progress Notes (Signed)
Daily Session Note ? ?Patient Details  ?Name: David Jennings ?MRN: [id number] ?Date of Birth: 1941-07-23 ?Referring Provider:   ?Flowsheet Row Cardiac Rehab from 11/23/2021 in [facility] Cardiac and Pulmonary Rehab  ?Referring Provider Lujean Amel MD  ? ?  ? ? ?Encounter Date: 01/17/2022 ? ?Check In: ? Session Check In - 01/17/22 0746   ? ?  ? Check-In  ? Supervising physician immediately available to respond to emergencies See telemetry face sheet for immediately available ER MD   ? Location [facility]-Cardiac & Pulmonary Rehab   ? Staff Present Birdie Sons, MPA, RN;Joseph [place], RCP,RRT,BSRT;Amanda Sommer, BA, ACSM CEP, Exercise Physiologist   ? Virtual Visit No   ? Medication changes reported     No   ? Fall or balance concerns reported    No   ? Warm-up and Cool-down Performed on first and last piece of equipment   ? Resistance Training Performed Yes   ? VAD Patient? No   ? PAD/SET Patient? No   ?  ? Pain Assessment  ? Currently in Pain? No/denies   ? ?  ?  ? ?  ? ? ? ? ? ?Social History  ? ?Tobacco Use  ?Smoking Status Never  ?Smokeless Tobacco Never  ? ? ?Goals Met:  ?Independence with exercise equipment ?Exercise tolerated well ?No report of concerns or symptoms today ?Strength training completed today ? ?Goals Unmet:  ?Not Applicable ? ?Comments: Pt able to follow exercise prescription today without complaint.  Will continue to monitor for progression. ? ? ? ?Dr. Emily Filbert is Medical Director for [place].  ?Dr. Ottie Glazier is Medical Director for [facility] Pulmonary Rehabilitation. ?

## 2022-01-17 NOTE — Progress Notes (Signed)
Cardiac Individual Treatment Plan ? ?Patient Details  ?Name: David Jennings ?MRN: [id number] ?Date of Birth: June 24, 1941 ?Referring Provider:   ?Flowsheet Row Cardiac Rehab from 11/23/2021 in [facility] Cardiac and Pulmonary Rehab  ?Referring Provider Lujean Amel MD  ? ?  ? ? ?Initial Encounter Date:  ?Flowsheet Row Cardiac Rehab from 11/23/2021 in [facility] Cardiac and Pulmonary Rehab  ?Date 11/23/21  ? ?  ? ? ?Visit Diagnosis: Status post coronary artery stent placement ? ?Patient's Home Medications on Admission: ? ?Current Outpatient Medications:  ?  acetaminophen (TYLENOL) 500 MG tablet, Take 2 tablets (1,000 mg total) by mouth every 6 (six) hours. (Patient not taking: Reported on 10/02/2021), Disp: 30 tablet, Rfl: 0 ?  albuterol (VENTOLIN HFA) 108 (90 Base) MCG/ACT inhaler, Inhale 2 puffs into the lungs every 6 (six) hours as needed for wheezing. Only takes PRN, Disp: , Rfl:  ?  amLODipine (NORVASC) 5 MG tablet, Take 5 mg by mouth daily., Disp: , Rfl:  ?  atenolol (TENORMIN) 25 MG tablet, Take 25 mg by mouth daily., Disp: , Rfl:  ?  atorvastatin (LIPITOR) 40 MG tablet, Take 1 tablet (40 mg total) by mouth daily at 6 PM., Disp: 30 tablet, Rfl: 0 ?  atorvastatin (LIPITOR) 40 MG tablet, Take 1 tablet (40 mg total) by mouth daily., Disp: , Rfl:  ?  Budeson-Glycopyrrol-Formoterol (BREZTRI AEROSPHERE) 160-9-4.8 MCG/ACT AERO, Inhale 2 puffs into the lungs in the morning and at bedtime., Disp: , Rfl:  ?  celecoxib (CELEBREX) 200 MG capsule, Take 1 capsule (200 mg total) by mouth every 12 (twelve) hours. (Patient not taking: Reported on 10/02/2021), Disp: 60 capsule, Rfl: 0 ?  clopidogrel (PLAVIX) 75 MG tablet, Take 75 mg by mouth daily., Disp: , Rfl:  ?  Docusate Calcium (STOOL SOFTENER PO), Take 1 tablet by mouth every other day., Disp: , Rfl:  ?  dupilumab (DUPIXENT) 300 MG/2ML prefilled syringe, Inject 300 mg into the skin every 14 (fourteen) days., Disp: , Rfl:  ?  fluocinonide cream (LIDEX) 5.36 %, Apply 1 application  topically daily as needed (irritation)., Disp: , Rfl:  ?  fluticasone (FLONASE) 50 MCG/ACT nasal spray, Place 2 sprays into both nostrils daily., Disp: , Rfl:  ?  hydrochlorothiazide (HYDRODIURIL) 25 MG tablet, Take 1 tablet (25 mg total) by mouth daily. (Patient not taking: Reported on 11/09/2021), Disp: , Rfl:  ?  ipratropium (ATROVENT HFA) 17 MCG/ACT inhaler, Inhale 2 puffs into the lungs every 6 (six) hours as needed for wheezing., Disp: , Rfl:  ?  ipratropium-albuterol (DUONEB) 0.5-2.5 (3) MG/3ML SOLN, Inhale 3 mLs into the lungs every 6 (six) hours as needed (shortness of breath or wheezing)., Disp: , Rfl:  ?  losartan (COZAAR) 100 MG tablet, Take 1 tablet (100 mg total) by mouth daily. (Patient not taking: Reported on 10/02/2021), Disp: , Rfl:  ?  losartan-hydrochlorothiazide (HYZAAR) 100-25 MG tablet, Take 1 tablet by mouth daily., Disp: , Rfl:  ?  methocarbamol (ROBAXIN) 500 MG tablet, Take 1 tablet (500 mg total) by mouth every 6 (six) hours as needed for muscle spasms. (Patient not taking: Reported on 10/02/2021), Disp: 60 tablet, Rfl: 0 ?  montelukast (SINGULAIR) 10 MG tablet, Take 10 mg by mouth daily., Disp: , Rfl:  ?  Multiple Vitamin (MULTIVITAMIN WITH MINERALS) TABS tablet, Take 1 tablet by mouth daily., Disp: , Rfl:  ?  nitroGLYCERIN (NITROSTAT) 0.4 MG SL tablet, Place under the tongue., Disp: , Rfl:  ?  oxybutynin (DITROPAN) 5 MG tablet, Take 5  mg by mouth 2 (two) times daily., Disp: , Rfl:  ?  prednisoLONE acetate (PRED FORTE) 1 % ophthalmic suspension, Place 1 drop into both eyes as needed., Disp: , Rfl:  ?  senna (SENOKOT) 8.6 MG TABS tablet, Take 1 tablet (8.6 mg total) by mouth 2 (two) times daily. (Patient not taking: Reported on 10/02/2021), Disp: 120 tablet, Rfl: 0 ? ?Past Medical History: ?Past Medical History:  ?Diagnosis Date  ? Arthritis   ? Asthma   ? Cancer [facility]) 2010  ? prostate CA  ? COVID-19   ? Dyspnea   ? Eczema   ? Hypertension   ? Pneumonia   ? Sleep apnea   ? uses cpap  ? Stroke  [facility])   ? ? ?Tobacco Use: ?Social History  ? ?Tobacco Use  ?Smoking Status Never  ?Smokeless Tobacco Never  ? ? ?Labs: ?Review Flowsheet   ? ?  ?  Latest Ref Rng & Units 10/06/2017  ?Labs for [facility] Cardiac and Pulmonary Rehab  ?Cholestrol 0 - 200 mg/dL 166    ?LDL (calc) 0 - 99 mg/dL 103    ?HDL-C >40 mg/dL 26    ?Trlycerides <150 mg/dL 184    ?  ? ? Multiple values from one day are sorted in reverse-chronological order  ?  ?  ? ? ? ?Exercise Target Goals: ?Exercise Program Goal: ?Individual exercise prescription set using results from initial 6 min walk test and THRR while considering  patient?s activity barriers and safety.  ? ?Exercise Prescription Goal: ?Initial exercise prescription builds to 30-45 minutes a day of aerobic activity, 2-3 days per week.  Home exercise guidelines will be given to patient during program as part of exercise prescription that the participant will acknowledge. ? ? ?Education: Aerobic Exercise: ?- Group verbal and visual presentation on the components of exercise prescription. Introduces F.I.T.T principle from ACSM for exercise prescriptions.  Reviews F.I.T.T. principles of aerobic exercise including progression. Written material given at graduation. ?Flowsheet Row Cardiac Rehab from 01/17/2022 in [facility] Cardiac and Pulmonary Rehab  ?Date 11/29/21  ?Educator [facility]  ?Instruction Review Code 1- Verbalizes Understanding  ? ?  ? ? ?Education: Resistance Exercise: ?- Group verbal and visual presentation on the components of exercise prescription. Introduces F.I.T.T principle from ACSM for exercise prescriptions  Reviews F.I.T.T. principles of resistance exercise including progression. Written material given at graduation. ?Flowsheet Row Cardiac Rehab from 01/17/2022 in [facility] Cardiac and Pulmonary Rehab  ?Date 12/06/21  ?Educator AS  ?Instruction Review Code 1- Verbalizes Understanding  ? ?  ? ?  ?Education: Exercise & Equipment Safety: ?- Individual verbal instruction and demonstration of equipment use  and safety with use of the equipment. ?Flowsheet Row Cardiac Rehab from 01/17/2022 in [facility] Cardiac and Pulmonary Rehab  ?Education need identified 11/23/21  ?Date 11/23/21  ?Educator [place]  ?Instruction Review Code 1- Verbalizes Understanding  ? ?  ? ? ?Education: Exercise Physiology & General Exercise Guidelines: ?- Group verbal and written instruction with models to review the exercise physiology of the cardiovascular system and associated critical values. Provides general exercise guidelines with specific guidelines to those with heart or lung disease.  ?Flowsheet Row Cardiac Rehab from 01/17/2022 in [facility] Cardiac and Pulmonary Rehab  ?Education need identified 11/23/21  ? ?  ? ? ?Education: Flexibility, Balance, Mind/Body Relaxation: ?- Group verbal and visual presentation with interactive activity on the components of exercise prescription. Introduces F.I.T.T principle from ACSM for exercise prescriptions. Reviews F.I.T.T. principles of flexibility and balance exercise training including progression.  Also discusses the mind body connection.  Reviews various relaxation techniques to help reduce and manage stress (i.e. Deep breathing, progressive muscle relaxation, and visualization). Balance handout provided to take home. Written material given at graduation. ?Flowsheet Row Cardiac Rehab from 01/17/2022 in [facility] Cardiac and Pulmonary Rehab  ?Date 12/20/21  ?Educator AS  ?Instruction Review Code 1- Verbalizes Understanding  ? ?  ? ? ?Activity Barriers & Risk Stratification: ? Activity Barriers & Cardiac Risk Stratification - 11/23/21 1508   ? ?  ? Activity Barriers & Cardiac Risk Stratification  ? Activity Barriers Right Knee Replacement;Left Knee Replacement;Joint Problems;Other (comment);Deconditioning;Muscular Weakness   ? Comments hip pain, surgery to help. started walking again outside- asthma flared.  Right shoulder problem-rotator cuff shot   ? Cardiac Risk Stratification High   ? ?  ?  ? ?  ? ? ?6 Minute Walk: ?  6 Minute Walk   ? ? [place] Name 11/23/21 1515  ?  ?  ?  ? 6 Minute Walk  ? Phase Initial    ? Distance 1212 feet    ? Walk Time 6 minutes    ? # of Rest Breaks 0    ? MPH 2.29    ? METS 1.56    ? RPE 11    ? [place]

## 2022-01-19 ENCOUNTER — Encounter: Payer: Medicare PPO | Admitting: *Deleted

## 2022-01-19 DIAGNOSIS — Z4889 Encounter for other specified surgical aftercare: Secondary | ICD-10-CM | POA: Diagnosis not present

## 2022-01-19 DIAGNOSIS — Z955 Presence of coronary angioplasty implant and graft: Secondary | ICD-10-CM

## 2022-01-19 NOTE — Progress Notes (Signed)
Daily Session Note ? ?Patient Details  ?Name: David Jennings ?MRN: [id number] ?Date of Birth: [birth year] ?Referring Provider:   ?Flowsheet Row Cardiac Rehab from 11/23/2021 in [facility] Cardiac and Pulmonary Rehab  ?Referring Provider Lujean Amel MD  ? ?  ? ? ?Encounter Date: 01/19/2022 ? ?Check In: ? Session Check In - 01/19/22 0801   ? ?  ? Check-In  ? Supervising physician immediately available to respond to emergencies See telemetry face sheet for immediately available ER MD   ? Location [facility]-Cardiac & Pulmonary Rehab   ? Staff Present Heath Lark, RN, BSN, CCRP;Jessica [employer], MA, RCEP, CCRP, CCET;Joseph [place], [place]   ? Virtual Visit No   ? Medication changes reported     No   ? Fall or balance concerns reported    No   ? Warm-up and Cool-down Performed on first and last piece of equipment   ? Resistance Training Performed Yes   ? VAD Patient? No   ? PAD/SET Patient? No   ?  ? Pain Assessment  ? Currently in Pain? No/denies   ? ?  ?  ? ?  ? ? ? ? ? ?Social History  ? ?Tobacco Use  ?Smoking Status Never  ?Smokeless Tobacco Never  ? ? ?Goals Met:  ?Independence with exercise equipment ?Exercise tolerated well ?No report of concerns or symptoms today ? ?Goals Unmet:  ?Not Applicable ? ?Comments: Pt able to follow exercise prescription today without complaint.  Will continue to monitor for progression. ? ? ? ?Dr. Emily Filbert is Medical Director for [place].  ?Dr. Ottie Glazier is Medical Director for [facility] Pulmonary Rehabilitation. ?

## 2022-01-22 ENCOUNTER — Encounter: Payer: Medicare PPO | Admitting: *Deleted

## 2022-01-22 VITALS — Ht 69.5 in | Wt 248.6 lb

## 2022-01-22 DIAGNOSIS — Z4889 Encounter for other specified surgical aftercare: Secondary | ICD-10-CM | POA: Diagnosis not present

## 2022-01-22 DIAGNOSIS — Z955 Presence of coronary angioplasty implant and graft: Secondary | ICD-10-CM

## 2022-01-22 NOTE — Progress Notes (Signed)
Daily Session Note ? ?Patient Details  ?Name: David Jennings ?MRN: [id number] ?Date of Birth: 1941-07-04 ?Referring Provider:   ?Flowsheet Row Cardiac Rehab from 11/23/2021 in [facility] Cardiac and Pulmonary Rehab  ?Referring Provider Lujean Amel MD  ? ?  ? ? ?Encounter Date: 01/22/2022 ? ?Check In: ? Session Check In - 01/22/22 0811   ? ?  ? Check-In  ? Supervising physician immediately available to respond to emergencies See telemetry face sheet for immediately available ER MD   ? Location [facility]-Cardiac & Pulmonary Rehab   ? Staff Present Heath Lark, RN, BSN, Laveda Norman, BS, ACSM CEP, Exercise Physiologist;Joseph [place], [place]   ? Virtual Visit No   ? Medication changes reported     No   ? Fall or balance concerns reported    No   ? Warm-up and Cool-down Performed on first and last piece of equipment   ? Resistance Training Performed Yes   ? VAD Patient? No   ? PAD/SET Patient? No   ?  ? Pain Assessment  ? Currently in Pain? No/denies   ? ?  ?  ? ?  ? ? ? ? ? ?Social History  ? ?Tobacco Use  ?Smoking Status Never  ?Smokeless Tobacco Never  ? ? ?Goals Met:  ?Independence with exercise equipment ?Exercise tolerated well ?No report of concerns or symptoms today ? ?Goals Unmet:  ?Not Applicable ? ?Comments: Pt able to follow exercise prescription today without complaint.  Will continue to monitor for progression. ? ? ? ?Dr. Emily Filbert is Medical Director for [place].  ?Dr. Ottie Glazier is Medical Director for [facility] Pulmonary Rehabilitation. ?

## 2022-01-24 DIAGNOSIS — Z955 Presence of coronary angioplasty implant and graft: Secondary | ICD-10-CM

## 2022-01-24 DIAGNOSIS — Z4889 Encounter for other specified surgical aftercare: Secondary | ICD-10-CM | POA: Diagnosis not present

## 2022-01-24 NOTE — Progress Notes (Signed)
Daily Session Note ? ?Patient Details  ?Name: David Jennings ?MRN: [id number] ?Date of Birth: 1941-01-07 ?Referring Provider:   ?Flowsheet Row Cardiac Rehab from 11/23/2021 in [facility] Cardiac and Pulmonary Rehab  ?Referring Provider Lujean Amel MD  ? ?  ? ? ?Encounter Date: 01/24/2022 ? ?Check In: ? Session Check In - 01/24/22 0752   ? ?  ? Check-In  ? Supervising physician immediately available to respond to emergencies See telemetry face sheet for immediately available ER MD   ? Location [facility]-Cardiac & Pulmonary Rehab   ? Staff Present Birdie Sons, MPA, RN;Joseph [place], [place]ren [place], MS, ASCM CEP, Exercise Physiologist;Jessica Luan [place], MA, RCEP, CCRP, CCET   ? Virtual Visit No   ? Medication changes reported     No   ? Fall or balance concerns reported    No   ? Warm-up and Cool-down Performed on first and last piece of equipment   ? Resistance Training Performed Yes   ? VAD Patient? No   ? PAD/SET Patient? No   ?  ? Pain Assessment  ? Currently in Pain? No/denies   ? ?  ?  ? ?  ? ? ? ? ? ?Social History  ? ?Tobacco Use  ?Smoking Status Never  ?Smokeless Tobacco Never  ? ? ?Goals Met:  ?Independence with exercise equipment ?Exercise tolerated well ?No report of concerns or symptoms today ?Strength training completed today ? ?Goals Unmet:  ?Not Applicable ? ?Comments: Pt able to follow exercise prescription today without complaint.  Will continue to monitor for progression. ? ? ? ?Dr. Emily Filbert is Medical Director for [place].  ?Dr. Ottie Glazier is Medical Director for [facility] Pulmonary Rehabilitation. ?

## 2022-01-25 NOTE — Patient Instructions (Signed)
Discharge Patient Instructions ? ?Patient Details  ?Name: David Jennings ?MRN: [id number] ?Date of Birth: 1940-12-11 ?Referring Provider:  Yolonda Kida, MD ? ? ?Number of Visits: 67 ? ?Reason for Discharge:  ?Patient reached a stable level of exercise. ?Patient independent in their exercise. ?Patient has met program and personal goals. ? ?Smoking History:  ?Social History  ? ?Tobacco Use  ?Smoking Status Never  ?Smokeless Tobacco Never  ? ? ?Diagnosis:  ?Status post coronary artery stent placement ? ?Initial Exercise Prescription: ? Initial Exercise Prescription - 11/23/21 1500   ? ?  ? Date of Initial Exercise RX and Referring Provider  ? Date 11/23/21   ? Referring Provider Lujean Amel MD   ?  ? Oxygen  ? Maintain Oxygen Saturation 88% or higher   ?  ? NuStep  ? Level 1   ? SPM 80   ? Minutes 15   ? METs 1.5   ?  ? REL-XR  ? Level 1   ? Speed 50   ? Minutes 15   ? METs 1.5   ?  ? T5 Nustep  ? Level 1   ? SPM 80   ? Minutes 15   ? METs 1.5   ?  ? Track  ? Laps 23   ? Minutes 15   ? METs 2.25   ?  ? Prescription Details  ? Frequency (times per week) 3   ? Duration Progress to 30 minutes of continuous aerobic without signs/symptoms of physical distress   ?  ? Intensity  ? THRR 40-80% of Max Heartrate 93- 123   ? Ratings of Perceived Exertion 11-13   ? Perceived Dyspnea 0-4   ?  ? Progression  ? Progression Continue to progress workloads to maintain intensity without signs/symptoms of physical distress.   ?  ? Resistance Training  ? Training Prescription Yes   ? Weight 3 lb   ? Reps 10-15   ? ?  ?  ? ?  ? ? ?Discharge Exercise Prescription (Final Exercise Prescription Changes): ? Exercise Prescription Changes - 01/15/22 1600   ? ?  ? Response to Exercise  ? Blood Pressure (Admit) 136/60   ? Blood Pressure (Exit) 122/82   ? Heart Rate (Admit) 59 bpm   ? Heart Rate (Exercise) 93 bpm   ? Heart Rate (Exit) 78 bpm   ? Oxygen Saturation (Admit) 89 %   ? Oxygen Saturation (Exercise) 91 %   ? Oxygen Saturation  (Exit) 93 %   ? Rating of Perceived Exertion (Exercise) 11   ? Symptoms none   ? Duration Continue with 30 min of aerobic exercise without signs/symptoms of physical distress.   ? Intensity THRR unchanged   ?  ? Progression  ? Progression Continue to progress workloads to maintain intensity without signs/symptoms of physical distress.   ? Average METs 2.6   ?  ? Resistance Training  ? Training Prescription Yes   ? Weight 4 lb   ? Reps 10-15   ?  ? Interval Training  ? Interval Training No   ?  ? NuStep  ? Level 4   ? Minutes 15   ? METs 2.9   ?  ? Track  ? Laps 31   ? Minutes 15   ? METs 2.3   ?  ? Home Exercise Plan  ? Plans to continue exercise at [facility] (comment)   [place]  ? Frequency Add 2 additional days to program  exercise sessions.   ? Initial Home Exercises Provided 01/05/22   ?  ? Oxygen  ? Maintain Oxygen Saturation 88% or higher   ? ?  ?  ? ?  ? ? ?Functional Capacity: ? 6 Minute Walk   ? ? [place] Name 11/23/21 1515 01/22/22 0746  ?  ?  ? 6 Minute Walk  ? Phase Initial Discharge   ? Distance 1212 feet 1420 feet   ? Distance % Change -- 17.2 %   ? Distance Feet Change -- 208 ft   ? Walk Time 6 minutes 6 minutes   ? # of Rest Breaks 0 0   ? MPH 2.29 2.69   ? METS 1.56 2.15   ? RPE 11 13   ? Perceived Dyspnea  0 0   ? VO2 Peak 5.47 7.53   ? Symptoms No No   ? Resting HR 64 bpm 65 bpm   ? Resting BP 124/68 132/70   ? Resting Oxygen Saturation  94 % 92 %   ? Exercise Oxygen Saturation  during 6 min walk 89 % 91 %   ? Max Ex. HR 93 bpm 100 bpm   ? Max Ex. BP 142/68 158/70   ? 2 Minute Post BP 118/68 --   ? ?  ?  ? ?  ? ? ?Exercise Goals and Review: ? Exercise Goals   ? ? [place] Name 11/23/21 1528  ?  ?  ?  ?  ?  ? Exercise Goals  ? Increase Physical Activity Yes      ? Intervention Provide advice, education, support and counseling about physical activity/exercise needs.;Develop an individualized exercise prescription for aerobic and resistive training based on initial evaluation findings, risk  stratification, comorbidities and participant's personal goals.      ? Expected Outcomes Short Term: Attend rehab on a regular basis to increase amount of physical activity.;Long Term: Add in home exercise to make exercise part of routine and to increase amount of physical activity.;Long Term: Exercising regularly at least 3-5 days a week.      ? Increase Strength and Stamina Yes      ? Intervention Provide advice, education, support and counseling about physical activity/exercise needs.;Develop an individualized exercise prescription for aerobic and resistive training based on initial evaluation findings, risk stratification, comorbidities and participant's personal goals.      ? Expected Outcomes Short Term: Increase workloads from initial exercise prescription for resistance, speed, and METs.;Short Term: Perform resistance training exercises routinely during rehab and add in resistance training at home;Long Term: Improve cardiorespiratory fitness, muscular endurance and strength as measured by increased METs and functional capacity (6MWT)      ? Able to understand and use rate of perceived exertion (RPE) scale Yes      ? Intervention Provide education and explanation on how to use RPE scale      ? Expected Outcomes Short Term: Able to use RPE daily in rehab to express subjective intensity level;Long Term:  Able to use RPE to guide intensity level when exercising independently      ? Able to understand and use Dyspnea scale Yes      ? Intervention Provide education and explanation on how to use Dyspnea scale      ? Expected Outcomes Short Term: Able to use Dyspnea scale daily in rehab to express subjective sense of shortness of breath during exertion;Long Term: Able to use Dyspnea scale to guide intensity level when exercising independently      ?  Knowledge and understanding of Target Heart Rate Range (THRR) Yes      ? Intervention Provide education and explanation of THRR including how the numbers were predicted  and where they are located for reference      ? Expected Outcomes Short Term: Able to state/look up THRR;Long Term: Able to use THRR to govern intensity when exercising independently;Short Term: Able to use daily as guideline for intensity in rehab      ? Able to check pulse independently Yes      ? Intervention Provide education and demonstration on how to check pulse in carotid and radial arteries.;Review the importance of being able to check your own pulse for safety during independent exercise      ? Expected Outcomes Short Term: Able to explain why pulse checking is important during independent exercise;Long Term: Able to check pulse independently and accurately      ? Understanding of Exercise Prescription Yes      ? Intervention Provide education, explanation, and written materials on patient's individual exercise prescription      ? Expected Outcomes Short Term: Able to explain program exercise prescription;Long Term: Able to explain home exercise prescription to exercise independently      ? ?  ?  ? ?  ? ? ? ?Nutrition & Weight - Outcomes: ? Pre Biometrics - 11/23/21 1507   ? ?  ? Pre Biometrics  ? Height 5' 9.5" (1.765 m)   ? Weight 252 lb 4.8 oz (114.4 kg)   ? BMI (Calculated) 36.74   ? Single Leg Stand 3.6 seconds   ? ?  ?  ? ?  ? ? Post Biometrics - 01/22/22 0747   ? ?  ?  Post  Biometrics  ? Height 5' 9.5" (1.765 m)   ? Weight 248 lb 9.6 oz (112.8 kg)   ? BMI (Calculated) 36.2   ? Single Leg Stand 3.1 seconds   ? ?  ?  ? ?  ? ? ?Nutrition: ? Nutrition Therapy & Goals - 12/04/21 0817   ? ?  ? Nutrition Therapy  ? Diet Heart healthy, low Na   ? Drug/Food Interactions Statins/Certain Fruits   ? Protein (specify units) 85g   Used adjusted body weight  ? Fiber 30 grams   ? Whole Grain Foods 3 servings   ? Saturated Fats 16 max. grams   ? Fruits and Vegetables 8 servings/day   ? Sodium 2 grams   ?  ? Personal Nutrition Goals  ? Nutrition Goal ST: Think about what you could add to meals to make it healthier  (ex: vegetables to chili), would like to follow up with his wife LT: Follow MyPlate guidlines for most meals   ? Comments 81 y.o. M admitted to rehab s/p stent placement. PMHx asthma, GERD, diverticulosis, H

## 2022-01-26 ENCOUNTER — Encounter: Payer: Medicare PPO | Admitting: *Deleted

## 2022-01-26 DIAGNOSIS — Z955 Presence of coronary angioplasty implant and graft: Secondary | ICD-10-CM

## 2022-01-26 DIAGNOSIS — Z4889 Encounter for other specified surgical aftercare: Secondary | ICD-10-CM | POA: Diagnosis not present

## 2022-01-26 NOTE — Progress Notes (Signed)
Daily Session Note ? ?Patient Details  ?Name: TREMON SAINVIL ?MRN: [id number] ?Date of Birth: Aug 23, 1941 ?Referring Provider:   ?Flowsheet Row Cardiac Rehab from 11/23/2021 in [facility] Cardiac and Pulmonary Rehab  ?Referring Provider Lujean Amel MD  ? ?  ? ? ?Encounter Date: 01/26/2022 ? ?Check In: ? Session Check In - 01/26/22 0814   ? ?  ? Check-In  ? Supervising physician immediately available to respond to emergencies See telemetry face sheet for immediately available ER MD   ? Location [facility]-Cardiac & Pulmonary Rehab   ? Staff Present Heath Lark, RN, BSN, CCRP;Joseph [place], [place], [place], [place], [place], CCET   ? Virtual Visit No   ? Medication changes reported     No   ? Fall or balance concerns reported    No   ? Warm-up and Cool-down Performed on first and last piece of equipment   ? Resistance Training Performed Yes   ? VAD Patient? No   ? PAD/SET Patient? No   ?  ? Pain Assessment  ? Currently in Pain? No/denies   ? ?  ?  ? ?  ? ? ? ? ? ?Social History  ? ?Tobacco Use  ?Smoking Status Never  ?Smokeless Tobacco Never  ? ? ?Goals Met:  ?Independence with exercise equipment ?Exercise tolerated well ?No report of concerns or symptoms today ? ?Goals Unmet:  ?Not Applicable ? ?Comments: Pt able to follow exercise prescription today without complaint.  Will continue to monitor for progression. ? ? ? ?Dr. Emily Filbert is Medical Director for [place].  ?Dr. Ottie Glazier is Medical Director for [facility] Pulmonary Rehabilitation. ?

## 2022-01-29 ENCOUNTER — Encounter: Payer: Medicare PPO | Attending: Internal Medicine | Admitting: *Deleted

## 2022-01-29 DIAGNOSIS — Z48812 Encounter for surgical aftercare following surgery on the circulatory system: Secondary | ICD-10-CM | POA: Insufficient documentation

## 2022-01-29 DIAGNOSIS — Z955 Presence of coronary angioplasty implant and graft: Secondary | ICD-10-CM | POA: Insufficient documentation

## 2022-01-29 NOTE — Progress Notes (Signed)
Daily Session Note ? ?Patient Details  ?Name: David Jennings ?MRN: [id number] ?Date of Birth: [birth year] ?Referring Provider:   ?Flowsheet Row Cardiac Rehab from 11/23/2021 in [facility] Cardiac and Pulmonary Rehab  ?Referring Provider Lujean Amel MD  ? ?  ? ? ?Encounter Date: 01/29/2022 ? ?Check In: ? Session Check In - 01/29/22 0817   ? ?  ? Check-In  ? Supervising physician immediately available to respond to emergencies See telemetry face sheet for immediately available ER MD   ? Location [facility]-Cardiac & Pulmonary Rehab   ? Staff Present Heath Lark, RN, BSN, Laveda Norman, BS, ACSM CEP, Exercise Physiologist;Joseph [place], [place]   ? Virtual Visit No   ? Medication changes reported     No   ? Fall or balance concerns reported    No   ? Warm-up and Cool-down Performed on first and last piece of equipment   ? Resistance Training Performed Yes   ? VAD Patient? No   ? PAD/SET Patient? No   ?  ? Pain Assessment  ? Currently in Pain? No/denies   ? ?  ?  ? ?  ? ? ? ? ? ?Social History  ? ?Tobacco Use  ?Smoking Status Never  ?Smokeless Tobacco Never  ? ? ?Goals Met:  ?Independence with exercise equipment ?Exercise tolerated well ?No report of concerns or symptoms today ? ?Goals Unmet:  ?Not Applicable ? ?Comments: Pt able to follow exercise prescription today without complaint.  Will continue to monitor for progression. ? ? ? ?Dr. Emily Filbert is Medical Director for [place].  ?Dr. Ottie Glazier is Medical Director for [facility] Pulmonary Rehabilitation. ?

## 2022-01-31 DIAGNOSIS — Z48812 Encounter for surgical aftercare following surgery on the circulatory system: Secondary | ICD-10-CM | POA: Diagnosis not present

## 2022-01-31 DIAGNOSIS — Z955 Presence of coronary angioplasty implant and graft: Secondary | ICD-10-CM

## 2022-01-31 NOTE — Progress Notes (Signed)
Discharge Progress Report ? ?Patient Details  ?Name: David Jennings ?MRN: [id number] ?Date of Birth: 01/11/1941 ?Referring Provider:   ?Flowsheet Row Cardiac Rehab from 11/23/2021 in [facility] Cardiac and Pulmonary Rehab  ?Referring Provider Lujean Amel MD  ? ?  ? ? ? ?Number of Visits: 87 ? ?Reason for Discharge:  ?Patient reached a stable level of exercise. ?Patient independent in their exercise. ?Patient has met program and personal goals. ? ?Smoking History:  ?Social History  ? ?Tobacco Use  ?Smoking Status Never  ?Smokeless Tobacco Never  ? ? ?Diagnosis:  ?Status post coronary artery stent placement ? ?ADL UCSD: ? ? ?Initial Exercise Prescription: ? Initial Exercise Prescription - 11/23/21 1500   ? ?  ? Date of Initial Exercise RX and Referring Provider  ? Date 11/23/21   ? Referring Provider Lujean Amel MD   ?  ? Oxygen  ? Maintain Oxygen Saturation 88% or higher   ?  ? NuStep  ? Level 1   ? SPM 80   ? Minutes 15   ? METs 1.5   ?  ? REL-XR  ? Level 1   ? Speed 50   ? Minutes 15   ? METs 1.5   ?  ? T5 Nustep  ? Level 1   ? SPM 80   ? Minutes 15   ? METs 1.5   ?  ? Track  ? Laps 23   ? Minutes 15   ? METs 2.25   ?  ? Prescription Details  ? Frequency (times per week) 3   ? Duration Progress to 30 minutes of continuous aerobic without signs/symptoms of physical distress   ?  ? Intensity  ? THRR 40-80% of Max Heartrate 93- 123   ? Ratings of Perceived Exertion 11-13   ? Perceived Dyspnea 0-4   ?  ? Progression  ? Progression Continue to progress workloads to maintain intensity without signs/symptoms of physical distress.   ?  ? Resistance Training  ? Training Prescription Yes   ? Weight 3 lb   ? Reps 10-15   ? ?  ?  ? ?  ? ? ?Discharge Exercise Prescription (Final Exercise Prescription Changes): ? Exercise Prescription Changes - 01/29/22 0800   ? ?  ? Response to Exercise  ? Blood Pressure (Admit) 128/64   ? Blood Pressure (Exit) 124/70   ? Heart Rate (Admit) 65 bpm   ? Heart Rate (Exercise) 99 bpm   ? Heart  Rate (Exit) 71 bpm   ? Rating of Perceived Exertion (Exercise) 12   ? Symptoms none   ? Duration Continue with 30 min of aerobic exercise without signs/symptoms of physical distress.   ? Intensity THRR unchanged   ?  ? Progression  ? Progression Continue to progress workloads to maintain intensity without signs/symptoms of physical distress.   ? Average METs 2.93   ?  ? Resistance Training  ? Training Prescription Yes   ? Weight 4 lb   ? Reps 10-15   ?  ? Interval Training  ? Interval Training No   ?  ? NuStep  ? Level 5   ? Minutes 15   ? METs 3.4   ?  ? T5 Nustep  ? Level 4   ? Minutes 15   ? METs 3   ?  ? Track  ? Laps 40   ? Minutes 15   ? METs 3.18   ?  ? Home Exercise Plan  ? Plans  to continue exercise at [facility] (comment)   [place]  ? Frequency Add 2 additional days to program exercise sessions.   ? Initial Home Exercises Provided 01/05/22   ?  ? Oxygen  ? Maintain Oxygen Saturation 88% or higher   ? ?  ?  ? ?  ? ? ?Functional Capacity: ? 6 Minute Walk   ? ? [place] Name 11/23/21 1515 01/22/22 0746  ?  ?  ? 6 Minute Walk  ? Phase Initial Discharge   ? Distance 1212 feet 1420 feet   ? Distance % Change -- 17.2 %   ? Distance Feet Change -- 208 ft   ? Walk Time 6 minutes 6 minutes   ? # of Rest Breaks 0 0   ? MPH 2.29 2.69   ? METS 1.56 2.15   ? RPE 11 13   ? Perceived Dyspnea  0 0   ? VO2 Peak 5.47 7.53   ? Symptoms No No   ? Resting HR 64 bpm 65 bpm   ? Resting BP 124/68 132/70   ? Resting Oxygen Saturation  94 % 92 %   ? Exercise Oxygen Saturation  during 6 min walk 89 % 91 %   ? Max Ex. HR 93 bpm 100 bpm   ? Max Ex. BP 142/68 158/70   ? 2 Minute Post BP 118/68 --   ? ?  ?  ? ?  ? ? ?Psychological, QOL, Others - Outcomes: ?PHQ 2/9: ? ?  01/26/2022  ?  8:16 AM 11/23/2021  ?  2:35 PM  ?Depression screen PHQ 2/9  ?Decreased Interest 0 0  ?Down, Depressed, Hopeless 0 0  ?PHQ - 2 Score 0 0  ?Altered sleeping 0 0  ?Tired, decreased energy 0 0  ?Change in appetite 0 0  ?Feeling bad or failure about  yourself  0 0  ?Trouble concentrating 0 0  ?Moving slowly or fidgety/restless 0 0  ?Suicidal thoughts 0 0  ?PHQ-9 Score 0 0  ?Difficult doing work/chores Not difficult at all Not difficult at all  ? ? ?Quality of Life: ? Quality of Life - 01/26/22 0815   ? ?  ? Quality of Life Scores  ? Health/Function Pre 12 %   ? Health/Function Post 23.2 %   ? Health/Function % Change 93.33 %   ? Socioeconomic Pre 20.75 %   ? Socioeconomic Post 19 %   ? Socioeconomic % Change  -8.43 %   ? Psych/Spiritual Pre 21.43 %   ? Psych/Spiritual Post 21.79 %   ? Psych/Spiritual % Change 1.68 %   ? Family Pre 24 %   ? Family Post 26.4 %   ? Family % Change 10 %   ? GLOBAL Pre 17.6 %   ? GLOBAL Post 22.62 %   ? GLOBAL % Change 28.52 %   ? ?  ?  ? ?  ? ? ? ?Nutrition & Weight - Outcomes: ? Pre Biometrics - 11/23/21 1507   ? ?  ? Pre Biometrics  ? Height 5' 9.5" (1.765 m)   ? Weight 252 lb 4.8 oz (114.4 kg)   ? BMI (Calculated) 36.74   ? Single Leg Stand 3.6 seconds   ? ?  ?  ? ?  ? ? Post Biometrics - 01/22/22 0747   ? ?  ?  Post  Biometrics  ? Height 5' 9.5" (1.765 m)   ? Weight 248 lb 9.6 oz (112.8 kg)   ?  BMI (Calculated) 36.2   ? Single Leg Stand 3.1 seconds   ? ?  ?  ? ?  ? ? ?Nutrition: ? Nutrition Therapy & Goals - 12/04/21 0817   ? ?  ? Nutrition Therapy  ? Diet Heart healthy, low Na   ? Drug/Food Interactions Statins/Certain Fruits   ? Protein (specify units) 85g   Used adjusted body weight  ? Fiber 30 grams   ? Whole Grain Foods 3 servings   ? Saturated Fats 16 max. grams   ? Fruits and Vegetables 8 servings/day   ? Sodium 2 grams   ?  ? Personal Nutrition Goals  ? Nutrition Goal ST: Think about what you could add to meals to make it healthier (ex: vegetables to chili), would like to follow up with his wife LT: Follow MyPlate guidlines for most meals   ? Comments 81 y.o. M admitted to rehab s/p stent placement. PMHx asthma, GERD, diverticulosis, HTN, prostate cancer (2009), HTN, cerebrovascular disease, chronic systolic HF. Relevant  medications includes lipitor, docusate calcium, MVI, senokot.  PYP Score: 49. Vegetables & Fruits 10/12. Breads, Grains & Cereals 3/12. Red & Processed Meat 7/12. Poultry 2/2. Fish & Shellfish 0/4. Beans, Nuts & Seeds 1/4. Milk & Dairy Foods 4/6. Toppings, Oils, Seasonings & Salt 9/20. Sweets, Snacks & Restaurant Food 5/14. Beverages 8/10. He reports changing his diet repeatedly with weight watchers and has a hard time taying on track. He lives in [place] and he lives close so he would like to go to the [employer]. B: 2 soft boiled egg with bran muffin or plain cheerios with berries and a banana L: cottage cheese with pineapple S: hungry at 3pm - may have cottage cheese or hard boiled egg D: cutting back on dinner since stent - he is only having one portion - rice, potatoes, chicken, green peas, saurkraut. They do not have much pasta - last minute meal. They eat a lot of fresh or frozen vegetables. Drinks: green (unsweetened). He has reduced his salt by hiding his salt shaker. They will go food shopping on Thursday. Discussed heart healthy eating and eating patterns.   ?  ? Intervention Plan  ? Intervention Prescribe, educate and counsel regarding individualized specific dietary modifications aiming towards targeted core components such as weight, hypertension, lipid management, diabetes, heart failure and other comorbidities.;Nutrition handout(s) given to patient.   ? Expected Outcomes Short Term Goal: Understand basic principles of dietary content, such as calories, fat, sodium, cholesterol and nutrients.;Short Term Goal: A plan has been developed with personal nutrition goals set during dietitian appointment.;Long Term Goal: Adherence to prescribed nutrition plan.   ? ?  ?  ? ?  ? ? ?Nutrition Discharge: ? ? ?Education Questionnaire Score: ? Knowledge Questionnaire Score - 01/26/22 0816   ? ?  ? Knowledge Questionnaire Score  ? Pre Score 23/26: Angina, Chest pain, Exercise   ? Post Score 23/26   ? ?  ?  ? ?   ? ? ?Goals reviewed with patient; copy given to patient. ?

## 2022-01-31 NOTE — Progress Notes (Signed)
Daily Session Note ? ?Patient Details  ?Name: David Jennings ?MRN: [id number] ?Date of Birth: [birth year] ?Referring Provider:   ?Flowsheet Row Cardiac Rehab from 11/23/2021 in [facility] Cardiac and Pulmonary Rehab  ?Referring Provider Lujean Amel MD  ? ?  ? ? ?Encounter Date: 01/31/2022 ? ?Check In: ? Session Check In - 01/31/22 0808   ? ?  ? Check-In  ? Supervising physician immediately available to respond to emergencies See telemetry face sheet for immediately available ER MD   ? Location [facility]-Cardiac & Pulmonary Rehab   ? Staff Present Birdie Sons, MPA, RN;Joseph [place], RCP,RRT,BSRT;Melissa [place], RDN, LDN;Jessica Hawkins, MA, RCEP, CCRP, CCET   ? Virtual Visit No   ? Medication changes reported     No   ? Fall or balance concerns reported    No   ? Warm-up and Cool-down Performed on first and last piece of equipment   ? Resistance Training Performed Yes   ? VAD Patient? No   ? PAD/SET Patient? No   ?  ? Pain Assessment  ? Currently in Pain? No/denies   ? ?  ?  ? ?  ? ? ? ? ? ?Social History  ? ?Tobacco Use  ?Smoking Status Never  ?Smokeless Tobacco Never  ? ? ?Goals Met:  ?Independence with exercise equipment ?Exercise tolerated well ?No report of concerns or symptoms today ?Strength training completed today ? ?Goals Unmet:  ?Not Applicable ? ?Comments:  Cortney graduated today from  rehab with 36 sessions completed.  Details of the patient's exercise prescription and what He needs to do in order to continue the prescription and progress were discussed with patient.  Patient was given a copy of prescription and goals.  Patient verbalized understanding.  [name] plans to continue to exercise by [facility] the [facility]. ? ? ? ?Dr. Emily Filbert is Medical Director for [place].  ?Dr. Ottie Glazier is Medical Director for [facility] Pulmonary Rehabilitation. ?

## 2022-01-31 NOTE — Progress Notes (Signed)
Cardiac Individual Treatment Plan ? ?Patient Details  ?Name: David Jennings ?MRN: [id number] ?Date of Birth: 1941-05-28 ?Referring Provider:   ?Flowsheet Row Cardiac Rehab from 11/23/2021 in [facility] Cardiac and Pulmonary Rehab  ?Referring Provider Lujean Amel MD  ? ?  ? ? ?Initial Encounter Date:  ?Flowsheet Row Cardiac Rehab from 11/23/2021 in [facility] Cardiac and Pulmonary Rehab  ?Date 11/23/21  ? ?  ? ? ?Visit Diagnosis: Status post coronary artery stent placement ? ?Patient's Home Medications on Admission: ? ?Current Outpatient Medications:  ?  acetaminophen (TYLENOL) 500 MG tablet, Take 2 tablets (1,000 mg total) by mouth every 6 (six) hours. (Patient not taking: Reported on 10/02/2021), Disp: 30 tablet, Rfl: 0 ?  albuterol (VENTOLIN HFA) 108 (90 Base) MCG/ACT inhaler, Inhale 2 puffs into the lungs every 6 (six) hours as needed for wheezing. Only takes PRN, Disp: , Rfl:  ?  amLODipine (NORVASC) 5 MG tablet, Take 5 mg by mouth daily., Disp: , Rfl:  ?  atenolol (TENORMIN) 25 MG tablet, Take 25 mg by mouth daily., Disp: , Rfl:  ?  atorvastatin (LIPITOR) 40 MG tablet, Take 1 tablet (40 mg total) by mouth daily at 6 PM., Disp: 30 tablet, Rfl: 0 ?  atorvastatin (LIPITOR) 40 MG tablet, Take 1 tablet (40 mg total) by mouth daily., Disp: , Rfl:  ?  Budeson-Glycopyrrol-Formoterol (BREZTRI AEROSPHERE) 160-9-4.8 MCG/ACT AERO, Inhale 2 puffs into the lungs in the morning and at bedtime., Disp: , Rfl:  ?  celecoxib (CELEBREX) 200 MG capsule, Take 1 capsule (200 mg total) by mouth every 12 (twelve) hours. (Patient not taking: Reported on 10/02/2021), Disp: 60 capsule, Rfl: 0 ?  clopidogrel (PLAVIX) 75 MG tablet, Take 75 mg by mouth daily., Disp: , Rfl:  ?  Docusate Calcium (STOOL SOFTENER PO), Take 1 tablet by mouth every other day., Disp: , Rfl:  ?  dupilumab (DUPIXENT) 300 MG/2ML prefilled syringe, Inject 300 mg into the skin every 14 (fourteen) days., Disp: , Rfl:  ?  fluocinonide cream (LIDEX) 8.18 %, Apply 1 application  topically daily as needed (irritation)., Disp: , Rfl:  ?  fluticasone (FLONASE) 50 MCG/ACT nasal spray, Place 2 sprays into both nostrils daily., Disp: , Rfl:  ?  hydrochlorothiazide (HYDRODIURIL) 25 MG tablet, Take 1 tablet (25 mg total) by mouth daily. (Patient not taking: Reported on 11/09/2021), Disp: , Rfl:  ?  ipratropium (ATROVENT HFA) 17 MCG/ACT inhaler, Inhale 2 puffs into the lungs every 6 (six) hours as needed for wheezing., Disp: , Rfl:  ?  ipratropium-albuterol (DUONEB) 0.5-2.5 (3) MG/3ML SOLN, Inhale 3 mLs into the lungs every 6 (six) hours as needed (shortness of breath or wheezing)., Disp: , Rfl:  ?  losartan (COZAAR) 100 MG tablet, Take 1 tablet (100 mg total) by mouth daily. (Patient not taking: Reported on 10/02/2021), Disp: , Rfl:  ?  losartan-hydrochlorothiazide (HYZAAR) 100-25 MG tablet, Take 1 tablet by mouth daily., Disp: , Rfl:  ?  methocarbamol (ROBAXIN) 500 MG tablet, Take 1 tablet (500 mg total) by mouth every 6 (six) hours as needed for muscle spasms. (Patient not taking: Reported on 10/02/2021), Disp: 60 tablet, Rfl: 0 ?  montelukast (SINGULAIR) 10 MG tablet, Take 10 mg by mouth daily., Disp: , Rfl:  ?  Multiple Vitamin (MULTIVITAMIN WITH MINERALS) TABS tablet, Take 1 tablet by mouth daily., Disp: , Rfl:  ?  nitroGLYCERIN (NITROSTAT) 0.4 MG SL tablet, Place under the tongue., Disp: , Rfl:  ?  oxybutynin (DITROPAN) 5 MG tablet, Take 5  mg by mouth 2 (two) times daily., Disp: , Rfl:  ?  prednisoLONE acetate (PRED FORTE) 1 % ophthalmic suspension, Place 1 drop into both eyes as needed., Disp: , Rfl:  ?  senna (SENOKOT) 8.6 MG TABS tablet, Take 1 tablet (8.6 mg total) by mouth 2 (two) times daily. (Patient not taking: Reported on 10/02/2021), Disp: 120 tablet, Rfl: 0 ? ?Past Medical History: ?Past Medical History:  ?Diagnosis Date  ? Arthritis   ? Asthma   ? Cancer [facility]) 2010  ? prostate CA  ? COVID-19   ? Dyspnea   ? Eczema   ? Hypertension   ? Pneumonia   ? Sleep apnea   ? uses cpap  ? Stroke  [facility])   ? ? ?Tobacco Use: ?Social History  ? ?Tobacco Use  ?Smoking Status Never  ?Smokeless Tobacco Never  ? ? ?Labs: ?Review Flowsheet   ? ?  ?  Latest Ref Rng & Units 10/06/2017  ?Labs for ITP Cardiac and Pulmonary Rehab  ?Cholestrol 0 - 200 mg/dL 166    ?LDL (calc) 0 - 99 mg/dL 103    ?HDL-C >40 mg/dL 26    ?Trlycerides <150 mg/dL 184    ?  ? ? Multiple values from one day are sorted in reverse-chronological order  ?  ?  ? ? ? ?Exercise Target Goals: ?Exercise Program Goal: ?Individual exercise prescription set using results from initial 6 min walk test and THRR while considering  patient?s activity barriers and safety.  ? ?Exercise Prescription Goal: ?Initial exercise prescription builds to 30-45 minutes a day of aerobic activity, 2-3 days per week.  Home exercise guidelines will be given to patient during program as part of exercise prescription that the participant will acknowledge. ? ? ?Education: Aerobic Exercise: ?- Group verbal and visual presentation on the components of exercise prescription. Introduces F.I.T.T principle from ACSM for exercise prescriptions.  Reviews F.I.T.T. principles of aerobic exercise including progression. Written material given at graduation. ?Flowsheet Row Cardiac Rehab from 01/31/2022 in [facility] Cardiac and Pulmonary Rehab  ?Date 11/29/21  ?Educator [facility]  ?Instruction Review Code 1- Verbalizes Understanding  ? ?  ? ? ?Education: Resistance Exercise: ?- Group verbal and visual presentation on the components of exercise prescription. Introduces F.I.T.T principle from ACSM for exercise prescriptions  Reviews F.I.T.T. principles of resistance exercise including progression. Written material given at graduation. ?Flowsheet Row Cardiac Rehab from 01/31/2022 in [facility] Cardiac and Pulmonary Rehab  ?Date 12/06/21  ?Educator AS  ?Instruction Review Code 1- Verbalizes Understanding  ? ?  ? ?  ?Education: Exercise & Equipment Safety: ?- Individual verbal instruction and demonstration of equipment use  and safety with use of the equipment. ?Flowsheet Row Cardiac Rehab from 01/31/2022 in [facility] Cardiac and Pulmonary Rehab  ?Education need identified 11/23/21  ?Date 11/23/21  ?Educator KL  ?Instruction Review Code 1- Verbalizes Understanding  ? ?  ? ? ?Education: Exercise Physiology & General Exercise Guidelines: ?- Group verbal and written instruction with models to review the exercise physiology of the cardiovascular system and associated critical values. Provides general exercise guidelines with specific guidelines to those with heart or lung disease.  ?Flowsheet Row Cardiac Rehab from 01/31/2022 in [facility] Cardiac and Pulmonary Rehab  ?Education need identified 11/23/21  ?Date 01/24/22  ?Educator [facility]  ?Instruction Review Code 1- Verbalizes Understanding  ? ?  ? ? ?Education: Flexibility, Balance, Mind/Body Relaxation: ?- Group verbal and visual presentation with interactive activity on the components of exercise prescription. Introduces F.I.T.T principle from ACSM for  exercise prescriptions. Reviews F.I.T.T. principles of flexibility and balance exercise training including progression. Also discusses the mind body connection.  Reviews various relaxation techniques to help reduce and manage stress (i.e. Deep breathing, progressive muscle relaxation, and visualization). Balance handout provided to take home. Written material given at graduation. ?Flowsheet Row Cardiac Rehab from 01/31/2022 in [facility] Cardiac and Pulmonary Rehab  ?Date 12/20/21  ?Educator AS  ?Instruction Review Code 1- Verbalizes Understanding  ? ?  ? ? ?Activity Barriers & Risk Stratification: ? Activity Barriers & Cardiac Risk Stratification - 11/23/21 1508   ? ?  ? Activity Barriers & Cardiac Risk Stratification  ? Activity Barriers Right Knee Replacement;Left Knee Replacement;Joint Problems;Other (comment);Deconditioning;Muscular Weakness   ? Comments hip pain, surgery to help. started walking again outside- asthma flared.  Right shoulder problem-rotator  cuff shot   ? Cardiac Risk Stratification High   ? ?  ?  ? ?  ? ? ?6 Minute Walk: ? 6 Minute Walk   ? ? [place] Name 11/23/21 1515 01/22/22 0746  ?  ?  ? 6 Minute Walk  ? Phase Initial Discharge   ? Distance 1212 feet 1

## 2022-02-16 ENCOUNTER — Other Ambulatory Visit: Payer: Self-pay | Admitting: Urology

## 2022-02-27 ENCOUNTER — Encounter (HOSPITAL_COMMUNITY): Payer: Self-pay

## 2022-02-27 NOTE — Patient Instructions (Addendum)
DUE TO COVID-19 ONLY TWO VISITORS  (aged 83 and older)  ARE ALLOWED TO COME WITH YOU AND STAY IN THE WAITING ROOM ONLY DURING PRE OP AND PROCEDURE.   **NO VISITORS ARE ALLOWED IN THE SHORT STAY AREA OR RECOVERY ROOM!!**  IF YOU WILL BE ADMITTED INTO THE HOSPITAL YOU ARE ALLOWED ONLY FOUR SUPPORT PEOPLE DURING VISITATION HOURS ONLY (7 AM -8PM)   The support person(s) must pass our screening, gel in and out, and wear a mask at all times, including in the patient's room. Patients must also wear a mask when staff or their support person are in the room. Visitors GUEST BADGE MUST BE WORN VISIBLY  One adult visitor may remain with you overnight and MUST be in the room by 8 P.M.     Your procedure is scheduled on: 03/05/22   Report to [facility] Main Entrance    Report to admitting at  6:30 AM   Call this number if you have problems the morning of surgery [phone]   Do not eat food or drink:After Midnight.             If you have questions, please contact your surgeon's office.      Oral Hygiene is also important to reduce your risk of infection.                                    Remember - BRUSH YOUR TEETH THE MORNING OF SURGERY WITH YOUR REGULAR TOOTHPASTE    Take these medicines the morning of surgery with A SIP OF WATER: Amlodipine, Oxybutynin. use your inhalers and bring them with you   Before surgery.Stop taking _Plavix__________on __5/30________as instructed by _____________.  Stop taking ____________as directed by your Surgeon/Cardiologist.  Contact your Surgeon/Cardiologist for instructions on Anticoagulant Therapy prior to surgery.    Bring CPAP mask and tubing day of surgery.                              You may not have any metal on your body including  jewelry, and body piercing             Do not wear  lotions, powders, perfumes/cologne, or deodorant               Men may shave face and neck.   Do not bring valuables to the hospital. [place].   Contacts, dentures or bridgework may not be worn into surgery.     Patients discharged on the day of surgery will not be allowed to drive home.  Someone NEEDS to stay with you for the first 24 hours after anesthesia.   Special Instructions: Bring a copy of your healthcare power of attorney and living will documents  the day of surgery if you haven't scanned them before.              Please read over the following fact sheets you were given: IF YOU HAVE QUESTIONS ABOUT YOUR PRE-OP INSTRUCTIONS PLEASE CALL [phone]     [facility] - Preparing for Surgery Before surgery, you can play an important role.  Because skin is not sterile, your skin needs to be as free of germs as possible.  You can reduce the number of  germs on your skin by washing with CHG (chlorahexidine gluconate) soap before surgery.  CHG is an antiseptic cleaner which kills germs and bonds with the skin to continue killing germs even after washing. Please DO NOT use if you have an allergy to CHG or antibacterial soaps.  If your skin becomes reddened/irritated stop using the CHG and inform your nurse when you arrive at Short Stay.  You may shave your face/neck. Please follow these instructions carefully:  1.  Shower with CHG Soap the night before surgery and the  morning of Surgery.  2.  If you choose to wash your hair, wash your hair first as usual with your  normal  shampoo.  3.  After you shampoo, rinse your hair and body thoroughly to remove the  shampoo.                            4.  Use CHG as you would any other liquid soap.  You can apply chg directly  to the skin and wash                       Gently with a scrungie or clean washcloth.  5.  Apply the CHG Soap to your body ONLY FROM THE NECK DOWN.   Do not use on face/ open                           Wound or open sores. Avoid contact with eyes, ears mouth and genitals (private parts).                       Wash face,   Genitals (private parts) with your normal soap.             6.  Wash thoroughly, paying special attention to the area where your surgery  will be performed.  7.  Thoroughly rinse your body with warm water from the neck down.  8.  DO NOT shower/wash with your normal soap after using and rinsing off  the CHG Soap.                9.  Pat yourself dry with a clean towel.            10.  Wear clean pajamas.            11.  Place clean sheets on your bed the night of your first shower and do not  sleep with pets. Day of Surgery : Do not apply any lotions/deodorants the morning of surgery.  Please wear clean clothes to the hospital/surgery center.  FAILURE TO FOLLOW THESE INSTRUCTIONS MAY RESULT IN THE CANCELLATION OF YOUR SURGERY   ________________________________________________________________________

## 2022-02-28 ENCOUNTER — Encounter (HOSPITAL_COMMUNITY): Payer: Self-pay

## 2022-02-28 ENCOUNTER — Other Ambulatory Visit: Payer: Self-pay

## 2022-02-28 ENCOUNTER — Encounter (HOSPITAL_COMMUNITY)
Admission: RE | Admit: 2022-02-28 | Discharge: 2022-02-28 | Disposition: A | Discharge status: Home / Self Care | Payer: Medicare PPO | Source: Ambulatory Visit | Attending: Urology | Admitting: Urology

## 2022-02-28 DIAGNOSIS — Z01818 Encounter for other preprocedural examination: Secondary | ICD-10-CM

## 2022-02-28 DIAGNOSIS — I2510 Atherosclerotic heart disease of native coronary artery without angina pectoris: Secondary | ICD-10-CM | POA: Diagnosis not present

## 2022-02-28 DIAGNOSIS — Z8673 Personal history of transient ischemic attack (TIA), and cerebral infarction without residual deficits: Secondary | ICD-10-CM | POA: Diagnosis not present

## 2022-02-28 DIAGNOSIS — I10 Essential (primary) hypertension: Secondary | ICD-10-CM | POA: Diagnosis not present

## 2022-02-28 DIAGNOSIS — G4730 Sleep apnea, unspecified: Secondary | ICD-10-CM | POA: Diagnosis not present

## 2022-02-28 DIAGNOSIS — Z7902 Long term (current) use of antithrombotics/antiplatelets: Secondary | ICD-10-CM | POA: Diagnosis not present

## 2022-02-28 DIAGNOSIS — I272 Pulmonary hypertension, unspecified: Secondary | ICD-10-CM | POA: Diagnosis not present

## 2022-02-28 DIAGNOSIS — C679 Malignant neoplasm of bladder, unspecified: Secondary | ICD-10-CM | POA: Insufficient documentation

## 2022-02-28 DIAGNOSIS — Z01812 Encounter for preprocedural laboratory examination: Secondary | ICD-10-CM | POA: Diagnosis present

## 2022-02-28 DIAGNOSIS — Z955 Presence of coronary angioplasty implant and graft: Secondary | ICD-10-CM | POA: Diagnosis not present

## 2022-02-28 HISTORY — DX: Atherosclerotic heart disease of native coronary artery without angina pectoris: I25.10

## 2022-02-28 LAB — CBC
HCT: 45.6 % (ref 39.0–52.0)
Hemoglobin: 15.2 g/dL (ref 13.0–17.0)
MCH: 32.1 pg (ref 26.0–34.0)
MCHC: 33.3 g/dL (ref 30.0–36.0)
MCV: 96.2 fL (ref 80.0–100.0)
Platelets: 251 10*3/uL (ref 150–400)
RBC: 4.74 MIL/uL (ref 4.22–5.81)
RDW: 13.2 % (ref 11.5–15.5)
WBC: 8.4 10*3/uL (ref 4.0–10.5)
nRBC: 0 % (ref 0.0–0.2)

## 2022-02-28 LAB — BASIC METABOLIC PANEL
Anion gap: 6 (ref 5–15)
BUN: 25 mg/dL — ABNORMAL HIGH (ref 8–23)
CO2: 28 mmol/L (ref 22–32)
Calcium: 10.2 mg/dL (ref 8.9–10.3)
Chloride: 105 mmol/L (ref 98–111)
Creatinine, Ser: 0.73 mg/dL (ref 0.61–1.24)
GFR, Estimated: >60 mL/min (ref >60)
Glucose, Bld: 101 mg/dL — ABNORMAL HIGH (ref 70–99)
Potassium: 4.2 mmol/L (ref 3.5–5.1)
Sodium: 139 mmol/L (ref 135–145)

## 2022-02-28 NOTE — Progress Notes (Signed)
Anesthesia note:  Bowel prep reminder:no  PCP - Dr. Warrick Parisian Cardiologist -Dr. Prince Rome Other-   Chest x-ray - no EKG - 04/27/22-epic Stress Test - no ECHO - no Cardiac Cath - PCI 10/30/21, 10/05/21  Pacemaker/ICD device last checked:NA  Sleep Study - yes CPAP - yes  Pt is pre diabetic-NA Fasting Blood Sugar -  Checks Blood Sugar _____  Blood Thinner:Plavix/ Dr. Clayborn Bigness  Blood Thinner Instructions:Stop 5 days prior to DOS. Aspirin Instructions:Stop 5 days prior to DOS Last Dose:02/27/22  Anesthesia review: yes  Patient denies shortness of breath, fever, cough and chest pain at PAT appointment Pt has continued with work outs 3/ week after rehab from 130/23. He has no SOB unless he is having an asthma attack which is rare.  Patient verbalized understanding of instructions that were given to them at the PAT appointment. Patient was also instructed that they will need to review over the PAT instructions again at home before surgery. yes

## 2022-03-01 NOTE — Progress Notes (Addendum)
Anesthesia Chart Review   Case: [id number] Date/Time: 03/05/22 0835   Procedure: TRANSURETHRAL RESECTION OF BLADDER TUMOR WITH GEMCITABINE   Anesthesia type: General   Pre-op diagnosis: BLADDER CANCER   Location: [place] / [facility] ORS   Surgeons: Franchot Gallo, MD       DISCUSSION:81 y.o. never smoker with h/o HTN, sleep apnea, stroke, CAD s/p PCI stent to RCA at [facility] 10/30/21, pulmonary HTN, bladder cancer scheduled for above procedure 03/05/2022 with Dr. Franchot Gallo.   Per cardiology pt is ok to hold Plavix 5 days prior to procedure.   Pt last seen by cardiology 12/04/2021. Stable at this visit.   Anticipate pt can proceed with planned procedure barring acute status change.   VS: BP 129/69   Pulse 67   Temp 36.7 C (Oral)   Resp 18   Ht '5\' 8"'$  (1.727 m)   Wt 108.9 kg   SpO2 95%   BMI 36.49 kg/m   PROVIDERS: [name], MD is PCP   Jobe Gibbon, MD is Cardiologist  LABS: Labs reviewed: Acceptable for surgery. 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   Ost Cx to Dist Cx lesion is 25% stenosed.   Prox LAD to Dist LAD lesion is 25% stenosed.   The left ventricular systolic function is normal.   LV end diastolic pressure is normal.   The left ventricular ejection fraction is 55-65% by visual estimate.   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   High-grade disease mid RCA recommend staged procedure for intervention   Recommend referral to tertiary care center for complex mid RCA   Conclusion Mild  significant pulmonary hypertension Normal left ventricular function of at least 55% Coronaries Significant single-vessel coronary disease mid RCA Minor irregularities of the left system Recommend referral to tertiary care center for possible intervention of mid RCA  Echo 10/06/2017 Study Conclusions   - Left ventricle: The cavity size was normal. Wall thickness was    normal. Systolic function was normal. The estimated ejection    fraction was in the range of 55% to 65%. Left ventricular    diastolic function parameters were normal.  - Mitral valve: There was mild regurgitation.  Past Medical History:  Diagnosis Date   Arthritis    Asthma    Cancer ([place]) 2010   prostate CA   Coronary artery disease    COVID-19    Dyspnea    Eczema    Hypertension    Sleep apnea    uses cpap   Stroke [facility]) 2019    Past Surgical History:  Procedure Laterality Date   ANKLE SURGERY Right 2020   BACK SURGERY  1960   COLONOSCOPY     LUMBAR LAMINECTOMY/DECOMPRESSION MICRODISCECTOMY N/A 04/26/2021   Procedure: L2-3 & L3-4 DECOMPRESSION; LEFT L5-S1 FORAMINOTOMY;  Surgeon: Meade Maw, MD;  Location: [facility] ORS;  Service: Neurosurgery;  Laterality: N/A;   MOHS SURGERY     basil cell   PROSTATE SURGERY  2012  seed implant   REPLACEMENT TOTAL KNEE BILATERAL Bilateral 2018   2017   RIGHT/LEFT HEART CATH AND CORONARY ANGIOGRAPHY N/A 10/05/2021   Procedure: RIGHT/LEFT HEART CATH AND CORONARY ANGIOGRAPHY;  Surgeon: Yolonda Kida, MD;  Location: [place] CV LAB;  Service: Cardiovascular;  Laterality: N/A;   TONSILLECTOMY     as a child    MEDICATIONS:  acetaminophen (TYLENOL) 325 MG tablet   albuterol (VENTOLIN HFA) 108 (90 Base) MCG/ACT inhaler   amLODipine (NORVASC) 5 MG tablet   aspirin EC 81 MG tablet   atenolol (TENORMIN) 50 MG tablet   atorvastatin (LIPITOR) 40 MG tablet   clopidogrel (PLAVIX) 75 MG tablet   docusate sodium (COLACE) 100 MG capsule   dupilumab (DUPIXENT)  300 MG/2ML prefilled syringe   fluocinonide cream (LIDEX) 0.05 %   fluticasone (FLONASE) 50 MCG/ACT nasal spray   fluticasone-salmeterol (ADVAIR) 250-50 MCG/ACT AEPB   ipratropium (ATROVENT HFA) 17 MCG/ACT inhaler   ipratropium-albuterol (DUONEB) 0.5-2.5 (3) MG/3ML SOLN   losartan-hydrochlorothiazide (HYZAAR) 100-25 MG tablet   montelukast (SINGULAIR) 10 MG tablet   Multiple Vitamin (MULTIVITAMIN WITH MINERALS) TABS tablet   nitroGLYCERIN (NITROSTAT) 0.4 MG SL tablet   oxybutynin (DITROPAN) 5 MG tablet   prednisoLONE acetate (PRED FORTE) 1 % ophthalmic suspension   No current facility-administered medications for this encounter.     Konrad Felix Ward, PA-C [facility] Pre-Surgical Testing [phone]

## 2022-03-05 ENCOUNTER — Ambulatory Visit (HOSPITAL_COMMUNITY): Payer: Medicare PPO | Admitting: Physician Assistant

## 2022-03-05 ENCOUNTER — Ambulatory Visit (HOSPITAL_BASED_OUTPATIENT_CLINIC_OR_DEPARTMENT_OTHER): Payer: Medicare PPO | Admitting: Anesthesiology

## 2022-03-05 ENCOUNTER — Encounter (HOSPITAL_COMMUNITY): Payer: Self-pay | Admitting: Urology

## 2022-03-05 ENCOUNTER — Ambulatory Visit (HOSPITAL_COMMUNITY)
Admission: RE | Admit: 2022-03-05 | Discharge: 2022-03-05 | Disposition: A | Discharge status: Home / Self Care | Payer: Medicare PPO | Attending: Urology | Admitting: Urology

## 2022-03-05 ENCOUNTER — Encounter (HOSPITAL_COMMUNITY)
Admission: RE | Disposition: A | Discharge status: Home / Self Care | Payer: Self-pay | Source: Home / Self Care | Attending: Urology

## 2022-03-05 DIAGNOSIS — G4733 Obstructive sleep apnea (adult) (pediatric): Secondary | ICD-10-CM | POA: Diagnosis not present

## 2022-03-05 DIAGNOSIS — I2510 Atherosclerotic heart disease of native coronary artery without angina pectoris: Secondary | ICD-10-CM | POA: Insufficient documentation

## 2022-03-05 DIAGNOSIS — E669 Obesity, unspecified: Secondary | ICD-10-CM | POA: Diagnosis not present

## 2022-03-05 DIAGNOSIS — C679 Malignant neoplasm of bladder, unspecified: Secondary | ICD-10-CM | POA: Insufficient documentation

## 2022-03-05 DIAGNOSIS — Z8673 Personal history of transient ischemic attack (TIA), and cerebral infarction without residual deficits: Secondary | ICD-10-CM | POA: Insufficient documentation

## 2022-03-05 DIAGNOSIS — I10 Essential (primary) hypertension: Secondary | ICD-10-CM

## 2022-03-05 DIAGNOSIS — D494 Neoplasm of unspecified behavior of bladder: Secondary | ICD-10-CM | POA: Diagnosis not present

## 2022-03-05 DIAGNOSIS — C672 Malignant neoplasm of lateral wall of bladder: Secondary | ICD-10-CM

## 2022-03-05 DIAGNOSIS — G4730 Sleep apnea, unspecified: Secondary | ICD-10-CM | POA: Insufficient documentation

## 2022-03-05 DIAGNOSIS — Z6836 Body mass index (BMI) 36.0-36.9, adult: Secondary | ICD-10-CM | POA: Diagnosis not present

## 2022-03-05 DIAGNOSIS — Z7902 Long term (current) use of antithrombotics/antiplatelets: Secondary | ICD-10-CM | POA: Insufficient documentation

## 2022-03-05 DIAGNOSIS — J45909 Unspecified asthma, uncomplicated: Secondary | ICD-10-CM | POA: Insufficient documentation

## 2022-03-05 DIAGNOSIS — Z01818 Encounter for other preprocedural examination: Secondary | ICD-10-CM

## 2022-03-05 HISTORY — PX: TRANSURETHRAL RESECTION OF BLADDER TUMOR WITH MITOMYCIN-C: SHX6459

## 2022-03-05 SURGERY — TRANSURETHRAL RESECTION OF BLADDER TUMOR WITH MITOMYCIN-C
Anesthesia: General

## 2022-03-05 MED ORDER — FENTANYL CITRATE (PF) 100 MCG/2ML IJ SOLN
INTRAMUSCULAR | Status: AC
  Filled 2022-03-05: qty 2

## 2022-03-05 MED ORDER — OXYCODONE HCL 5 MG PO TABS: 5.0000 mg | ORAL_TABLET | Freq: Once | ORAL | Status: DC | PRN

## 2022-03-05 MED ORDER — OXYCODONE HCL 5 MG/5ML PO SOLN: 5.0000 mg | Freq: Once | ORAL | Status: DC | PRN

## 2022-03-05 MED ORDER — FENTANYL CITRATE PF 50 MCG/ML IJ SOSY
25.0000 ug | PREFILLED_SYRINGE | INTRAMUSCULAR | Status: DC | PRN
  Administered 2022-03-05: 50 ug via INTRAVENOUS

## 2022-03-05 MED ORDER — PROPOFOL 10 MG/ML IV BOLUS
INTRAVENOUS | Status: AC
  Filled 2022-03-05: qty 20

## 2022-03-05 MED ORDER — LIDOCAINE HCL (PF) 2 % IJ SOLN
INTRAMUSCULAR | Status: AC
  Filled 2022-03-05: qty 5

## 2022-03-05 MED ORDER — SUGAMMADEX SODIUM 200 MG/2ML IV SOLN
INTRAVENOUS | Status: DC | PRN
  Administered 2022-03-05: 200 mg via INTRAVENOUS

## 2022-03-05 MED ORDER — ONDANSETRON HCL 4 MG/2ML IJ SOLN
INTRAMUSCULAR | Status: DC | PRN
  Administered 2022-03-05: 4 mg via INTRAVENOUS

## 2022-03-05 MED ORDER — ONDANSETRON HCL 4 MG/2ML IJ SOLN: 4.0000 mg | Freq: Once | INTRAMUSCULAR | Status: DC | PRN

## 2022-03-05 MED ORDER — ONDANSETRON HCL 4 MG/2ML IJ SOLN
INTRAMUSCULAR | Status: AC
  Filled 2022-03-05: qty 2

## 2022-03-05 MED ORDER — ORAL CARE MOUTH RINSE: 15.0000 mL | Freq: Once | OROMUCOSAL | Status: AC

## 2022-03-05 MED ORDER — DEXAMETHASONE SODIUM PHOSPHATE 10 MG/ML IJ SOLN
INTRAMUSCULAR | Status: DC | PRN
  Administered 2022-03-05: 10 mg via INTRAVENOUS

## 2022-03-05 MED ORDER — FENTANYL CITRATE (PF) 100 MCG/2ML IJ SOLN
INTRAMUSCULAR | Status: DC | PRN
  Administered 2022-03-05: 50 ug via INTRAVENOUS

## 2022-03-05 MED ORDER — LACTATED RINGERS IV SOLN: INTRAVENOUS | Status: DC

## 2022-03-05 MED ORDER — ROCURONIUM BROMIDE 10 MG/ML (PF) SYRINGE
PREFILLED_SYRINGE | INTRAVENOUS | Status: AC
Start: 2022-03-05 — End: ?
  Filled 2022-03-05: qty 10

## 2022-03-05 MED ORDER — PHENYLEPHRINE 80 MCG/ML (10ML) SYRINGE FOR IV PUSH (FOR BLOOD PRESSURE SUPPORT)
PREFILLED_SYRINGE | INTRAVENOUS | Status: AC
Start: 2022-03-05 — End: ?
  Filled 2022-03-05: qty 10

## 2022-03-05 MED ORDER — CHLORHEXIDINE GLUCONATE 0.12 % MT SOLN
15.0000 mL | Freq: Once | OROMUCOSAL | Status: AC
  Administered 2022-03-05: 15 mL via OROMUCOSAL

## 2022-03-05 MED ORDER — CIPROFLOXACIN IN D5W 400 MG/200ML IV SOLN
400.0000 mg | Freq: Once | INTRAVENOUS | Status: AC
  Administered 2022-03-05: 400 mg via INTRAVENOUS
  Filled 2022-03-05: qty 200

## 2022-03-05 MED ORDER — STERILE WATER FOR IRRIGATION IR SOLN
Status: DC | PRN
  Administered 2022-03-05: 6000 mL

## 2022-03-05 MED ORDER — FENTANYL CITRATE PF 50 MCG/ML IJ SOSY
PREFILLED_SYRINGE | INTRAMUSCULAR | Status: AC
  Filled 2022-03-05: qty 1

## 2022-03-05 MED ORDER — GEMCITABINE CHEMO FOR BLADDER INSTILLATION 2000 MG
2000.0000 mg | Freq: Once | INTRAVENOUS | Status: AC
  Administered 2022-03-05: 2000 mg via INTRAVESICAL
  Filled 2022-03-05: qty 2000

## 2022-03-05 MED ORDER — DEXAMETHASONE SODIUM PHOSPHATE 10 MG/ML IJ SOLN
INTRAMUSCULAR | Status: AC
  Filled 2022-03-05: qty 1

## 2022-03-05 MED ORDER — PHENYLEPHRINE HCL (PRESSORS) 10 MG/ML IV SOLN
INTRAVENOUS | Status: DC | PRN
  Administered 2022-03-05 (×2): 80 ug via INTRAVENOUS

## 2022-03-05 MED ORDER — LIDOCAINE HCL (CARDIAC) PF 100 MG/5ML IV SOSY
PREFILLED_SYRINGE | INTRAVENOUS | Status: DC | PRN
  Administered 2022-03-05: 80 mg via INTRAVENOUS

## 2022-03-05 MED ORDER — AMISULPRIDE (ANTIEMETIC) 5 MG/2ML IV SOLN: 10.0000 mg | Freq: Once | INTRAVENOUS | Status: DC | PRN

## 2022-03-05 MED ORDER — PROPOFOL 10 MG/ML IV BOLUS
INTRAVENOUS | Status: DC | PRN
  Administered 2022-03-05: 130 mg via INTRAVENOUS

## 2022-03-05 MED ORDER — ROCURONIUM BROMIDE 100 MG/10ML IV SOLN
INTRAVENOUS | Status: DC | PRN
  Administered 2022-03-05: 60 mg via INTRAVENOUS

## 2022-03-05 MED ORDER — ACETAMINOPHEN 500 MG PO TABS
1000.0000 mg | ORAL_TABLET | Freq: Once | ORAL | Status: AC
  Administered 2022-03-05: 1000 mg via ORAL
  Filled 2022-03-05: qty 2

## 2022-03-05 SURGICAL SUPPLY — 22 items
BAG URINE DRAIN 2000ML AR STRL (UROLOGICAL SUPPLIES) ×1 IMPLANT
BAG URO CATCHER STRL LF (MISCELLANEOUS) ×2 IMPLANT
CATH FOLEY 2WAY SLVR  5CC 18FR (CATHETERS) ×1
CATH FOLEY 2WAY SLVR 30CC 24FR (CATHETERS) IMPLANT
CATH FOLEY 2WAY SLVR 5CC 18FR (CATHETERS) IMPLANT
DRAPE FOOT SWITCH (DRAPES) ×2 IMPLANT
EVACUATOR MICROVAS BLADDER (UROLOGICAL SUPPLIES) IMPLANT
GLOVE SURG LX 8.0 MICRO (GLOVE) ×1
GLOVE SURG LX STRL 8.0 MICRO (GLOVE) ×1 IMPLANT
GOWN STRL REUS W/ TWL XL LVL3 (GOWN DISPOSABLE) ×1 IMPLANT
GOWN STRL REUS W/TWL XL LVL3 (GOWN DISPOSABLE) ×1
KIT TURNOVER KIT A (KITS) IMPLANT
LOOP CUT BIPOLAR 24F LRG (ELECTROSURGICAL) ×2 IMPLANT
MANIFOLD NEPTUNE II (INSTRUMENTS) ×2 IMPLANT
NDL SAFETY ECLIPSE 18X1.5 (NEEDLE) ×1 IMPLANT
NEEDLE HYPO 18GX1.5 SHARP (NEEDLE) ×1
PACK CYSTO (CUSTOM PROCEDURE TRAY) ×2 IMPLANT
SYR TOOMEY IRRIG 70ML (MISCELLANEOUS)
SYRINGE TOOMEY IRRIG 70ML (MISCELLANEOUS) IMPLANT
TUBING CONNECTING 10 (TUBING) ×2 IMPLANT
TUBING UROLOGY SET (TUBING) ×2 IMPLANT
WATER STERILE IRR 3000ML UROMA (IV SOLUTION) ×2 IMPLANT

## 2022-03-05 NOTE — Anesthesia Preprocedure Evaluation (Addendum)
Anesthesia Evaluation  Patient identified by MRN, date of birth, ID band Patient awake    Reviewed: Allergy & Precautions, NPO status , Patient's Chart, lab work & pertinent test results  Airway Mallampati: II  TM Distance: >3 FB Neck ROM: Full    Dental no notable dental hx.    Pulmonary asthma , sleep apnea and Continuous Positive Airway Pressure Ventilation ,    Pulmonary exam normal breath sounds clear to auscultation       Cardiovascular Exercise Tolerance: Good hypertension, Pt. on medications and Pt. on home beta blockers + CAD  Normal cardiovascular exam Rhythm:Regular Rate:Normal     Neuro/Psych CVA (on plavix last dose 02/27/22) negative psych ROS   GI/Hepatic negative GI ROS, Neg liver ROS,   Endo/Other  negative endocrine ROS  Renal/GU Renal disease   Bladder ancer    Musculoskeletal  (+) Arthritis ,   Abdominal (+) + obese,   Peds negative pediatric ROS (+)  Hematology negative hematology ROS (+)   Anesthesia Other Findings   Reproductive/Obstetrics negative OB ROS                            Anesthesia Physical Anesthesia Plan  ASA: 3  Anesthesia Plan: General   Post-op Pain Management: Tylenol PO (pre-op)*   Induction: Intravenous  PONV Risk Score and Plan: 2 and Treatment may vary due to age or medical condition, Ondansetron and Dexamethasone  Airway Management Planned: LMA  Additional Equipment:   Intra-op Plan:   Post-operative Plan: Extubation in OR  Informed Consent: I have reviewed the patients History and Physical, chart, labs and discussed the procedure including the risks, benefits and alternatives for the proposed anesthesia with the patient or authorized representative who has indicated his/her understanding and acceptance.     Dental advisory given  Plan Discussed with: CRNA, Anesthesiologist and Surgeon  Anesthesia Plan Comments:          Anesthesia Quick Evaluation

## 2022-03-05 NOTE — H&P (Signed)
H&P  Chief Complaint: Bladder tumor  History of Present Illness: 81 year old male presents for TURBT and placement of intravesical gemcitabine for a 2.5 cm right posterior wall tumor.  Past Medical History:  Diagnosis Date   Arthritis    Asthma    Cancer ([place]) 2010   prostate CA   Coronary artery disease    COVID-19    Dyspnea    Eczema    Hypertension    Sleep apnea    uses cpap   Stroke [facility]) 2019    Past Surgical History:  Procedure Laterality Date   ANKLE SURGERY Right 2020   BACK SURGERY  1960   COLONOSCOPY     LUMBAR LAMINECTOMY/DECOMPRESSION MICRODISCECTOMY N/A 04/26/2021   Procedure: L2-3 & L3-4 DECOMPRESSION; LEFT L5-S1 FORAMINOTOMY;  Surgeon: Meade Maw, MD;  Location: [facility] ORS;  Service: Neurosurgery;  Laterality: N/A;   MOHS SURGERY     basil cell   PROSTATE SURGERY  2012   seed implant   REPLACEMENT TOTAL KNEE BILATERAL Bilateral 2018   2017   RIGHT/LEFT HEART CATH AND CORONARY ANGIOGRAPHY N/A 10/05/2021   Procedure: RIGHT/LEFT HEART CATH AND CORONARY ANGIOGRAPHY;  Surgeon: Yolonda Kida, MD;  Location: [place] CV LAB;  Service: Cardiovascular;  Laterality: N/A;   TONSILLECTOMY     as a child    Home Medications:    Allergies:  Allergies  Allergen Reactions   Cefazolin Rash   Tape Rash    Band-Aid causes redness    History reviewed. No pertinent family history.  Social History:  reports that he has never smoked. He has never used smokeless tobacco. He reports current alcohol use. He reports that he does not use drugs.  ROS: A complete review of systems was performed.  All systems are negative except for pertinent findings as noted.  Physical Exam:  Vital signs in last 24 hours: BP 132/74   Pulse 65   Temp 98.3 F (36.8 C) (Oral)   Resp 18   Ht '5\' 8"'$  (1.727 m)   Wt 108.7 kg   SpO2 97%   BMI 36.44 kg/m  Constitutional:  Alert and oriented, No acute distress Cardiovascular: Regular rate  Respiratory: Normal  respiratory effort GI: Abdomen is soft, nontender, nondistended, no abdominal masses. No CVAT.  Genitourinary: Normal male phallus, testes are descended bilaterally and non-tender and without masses, scrotum is normal in appearance without lesions or masses, perineum is normal on inspection. 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

## 2022-03-05 NOTE — Anesthesia Postprocedure Evaluation (Signed)
Anesthesia Post Note  Patient: David Jennings  Procedure(s) Performed: TRANSURETHRAL RESECTION OF BLADDER TUMOR WITH GEMCITABINE     Patient location during evaluation: PACU Anesthesia Type: General Level of consciousness: awake Pain management: pain level controlled Vital Signs Assessment: post-procedure vital signs reviewed and stable Respiratory status: spontaneous breathing and respiratory function stable Cardiovascular status: stable Postop Assessment: no apparent nausea or vomiting Anesthetic complications: no   No notable events documented.  Last Vitals:  Vitals:   03/05/22 1030 03/05/22 1045  BP: 122/70 124/71  Pulse: (!) 55 (!) 38  Resp: 11 (!) 9  Temp:    SpO2: 90% 93%    Last Pain:  Vitals:   03/05/22 1000  TempSrc:   PainSc: 0-No pain                 Merlinda Frederick

## 2022-03-05 NOTE — Transfer of Care (Signed)
Immediate Anesthesia Transfer of Care Note  Patient: David Jennings  Procedure(s) Performed: TRANSURETHRAL RESECTION OF BLADDER TUMOR WITH GEMCITABINE  Patient Location: PACU  Anesthesia Type:General  Level of Consciousness: awake and patient cooperative  Airway & Oxygen Therapy: Patient Spontanous Breathing and Patient connected to face mask  Post-op Assessment: Report given to RN and Post -op Vital signs reviewed and stable  Post vital signs: Reviewed and stable  Last Vitals:  Vitals Value Taken Time  BP    Temp    Pulse 57 03/05/22 0939  Resp 14 03/05/22 0939  SpO2 98 % 03/05/22 0939  Vitals shown include unvalidated device data.  Last Pain:  Vitals:   03/05/22 0658  TempSrc:   PainSc: 0-No pain         Complications: No notable events documented.

## 2022-03-05 NOTE — Interval H&P Note (Signed)
History and Physical Interval Note:  03/05/2022 8:33 AM  David Jennings  has presented today for surgery, with the diagnosis of BLADDER CANCER.  The various methods of treatment have been discussed with the patient and family. After consideration of risks, benefits and other options for treatment, the patient has consented to  Procedure(s): TRANSURETHRAL RESECTION OF BLADDER TUMOR WITH GEMCITABINE (N/A) as a surgical intervention.  The patient's history has been reviewed, patient examined, no change in status, stable for surgery.  I have reviewed the patient's chart and labs.  Questions were answered to the patient's satisfaction.     Lillette Boxer Chaquana Nichols

## 2022-03-05 NOTE — Op Note (Signed)
Preoperative diagnosis: 2.5 cm right posterior lateral wall tumor  Postop diagnosis: Same  Principal procedure: Cystoscopy, transurethral resection of bladder tumor, 2.5 cm lesion, placement of intravesical gemcitabine  Surgeon: Dodi Leu  Anesthesia: General endotracheal  Complications: None  Specimens: 1.  Bladder tumor 2.  Bladder tumor base, to pathology  Estimated blood loss: Less than 25 mL  Indications: 81 year old male presents for TURBT of a 2.5 cm right posterior lateral bladder tumor.  He initially presented with gross hematuria.  Upper tract evaluation  was unremarkable.  I have discussed with him surgical removal of the bladder tumor as well as placement of postoperative intravesical gemcitabine.  Risks and complications including but not limited to urinary tract infection, bleeding, deep injury to the bladder, anesthetic complications were discussed with him.  He understands and desires to proceed.  Findings: Urethra was normal.  Prostate mildly obstructive.  Ureteral orifices were normal.  There was a papillary lesion in the right posterior lateral bladder with dystrophic calcifications scattered throughout.  No other urothelial lesions were noted within the bladder.  There were no trabeculations.  Description of procedure: The patient was properly identified in the holding area, was taken to the operating room where general anesthetic was administered.  Genitalia and perineum were prepped, draped, intravenous antibiotics administered.  Proper timeout was performed.  Urethral meatus was dilated to 30 [place] with [employer] sounds.  13 French resectoscope sheath was passed using the visual obturator.  Circumferential inspection of the bladder was then performed.  Resectoscope and bipolar cutting loop were then placed.  The tumor was  resected down to the base.  Small bleeders were cauterized.  Tumor fragments were sent labeled "bladder tumor".  I then resected deep into the muscle  tissue at the bladder tumor base.  These fragments were sent labeled "bladder tumor base".  Cauterization was performed following the second resection.  Came back hemostasis was excellent.  At this point, the scope was removed.  24 French Foley catheter was placed, balloon filled with 10 cc of water and this was hooked to dependent drainage.  The patient was then awakened, extubated, and taken to the PACU in stable condition, having tolerated the procedure well.  In the PACU, 2 g of gemcitabine and diluent were instilled and left indwelling for an hour.  At that point, it was drained and the catheter hooked back to dependent drainage.

## 2022-03-05 NOTE — Anesthesia Procedure Notes (Signed)
Procedure Name: Intubation Date/Time: 03/05/2022 9:02 AM Performed by: Claudia Desanctis, CRNA Pre-anesthesia Checklist: Patient identified Patient Re-evaluated:Patient Re-evaluated prior to induction Oxygen Delivery Method: Circle system utilized Preoxygenation: Pre-oxygenation with 100% oxygen Induction Type: IV induction Ventilation: Mask ventilation without difficulty Laryngoscope Size: 2 and Miller Grade View: Grade I Tube type: Oral Tube size: 7.5 mm Number of attempts: 1 Airway Equipment and Method: Stylet Placement Confirmation: ETT inserted through vocal cords under direct vision, positive ETCO2 and breath sounds checked- equal and bilateral Secured at: 23 cm Tube secured with: Tape Dental Injury: Teeth and Oropharynx as per pre-operative assessment

## 2022-03-05 NOTE — Discharge Instructions (Addendum)
You may see some blood in the urine and may have some burning with urination for 48-72 hours. You also may notice that you have to urinate more frequently or urgently after your procedure which is normal.  You should call should you develop an inability urinate, fever > 101, persistent nausea and vomiting that prevents you from eating or drinking to stay hydrated.  If you have a catheter, you will be taught how to take care of the catheter by the nursing staff prior to discharge from the hospital.  You may periodically feel a strong urge to void with the catheter in place.  This is a bladder spasm and most often can occur when having a bowel movement or moving around. It is typically self-limited and usually will stop after a few minutes.  You may use some Vaseline or Neosporin around the tip of the catheter to reduce friction at the tip of the penis. You may also see some blood in the urine.  A very small amount of blood can make the urine look quite red.  As long as the catheter is draining well, there usually is not a problem.  However, if the catheter is not draining well and is bloody, you should call the office [phone] to notify us.  It is okay to remove the catheter as instructed by the nurses on Tuesday morning. It is okay to get back on the Plavix once your urine turns yellow.

## 2022-03-06 ENCOUNTER — Encounter (HOSPITAL_COMMUNITY): Payer: Self-pay | Admitting: Urology

## 2022-03-06 LAB — SURGICAL PATHOLOGY

## 2022-03-27 ENCOUNTER — Other Ambulatory Visit: Payer: Self-pay | Admitting: Urology

## 2022-04-04 ENCOUNTER — Other Ambulatory Visit: Payer: Self-pay

## 2022-04-04 ENCOUNTER — Encounter (HOSPITAL_BASED_OUTPATIENT_CLINIC_OR_DEPARTMENT_OTHER): Payer: Self-pay | Admitting: Urology

## 2022-04-04 NOTE — Progress Notes (Addendum)
Spoke w/ via phone for pre-op interview---pt Lab needs dos----  I stat and EKG             Lab results------n/a COVID test -----patient states asymptomatic no test needed Arrive at -------0645 NPO after MN NO Solid Food.  Clear liquids from MN until---0545 Med rec completed Medications to take morning of surgery -----all inhalers, singuilar,amlodipine, flonase,ditropan Diabetic medication -----n/a Patient instructed no nail polish to be worn day of surgery Patient instructed to bring photo id and insurance card day of surgery Patient aware to have Driver (ride ) / caregiver  wife David Jennings  for 24 hours after surgery  Patient Special Instructions -----bring inhalers and CPAP machine to surgery center,   leave CPAP in car.  Pre-Op special Istructions -----holding plavix 5 days before surgery per pt from [facility] urology  Patient verbalized understanding of instructions that were given at this phone interview. Patient denies shortness of breath, chest pain, fever, cough at this phone interview.   Called Dr. Vita Barley, cardiology to have someone fax the ordered to hold plavix 5 days before surgery, waiting for nurse to call me back   last office visit 12/04/2021  Cardiac cath done on 10/05/2021, stint placed on 10/30/2021.  Fax received from [facility] urology when pt should stop his Plavix and restart after surgery.

## 2022-04-12 ENCOUNTER — Encounter (HOSPITAL_BASED_OUTPATIENT_CLINIC_OR_DEPARTMENT_OTHER)
Admission: RE | Disposition: A | Discharge status: Home / Self Care | Payer: Self-pay | Source: Home / Self Care | Attending: Urology

## 2022-04-12 ENCOUNTER — Encounter (HOSPITAL_BASED_OUTPATIENT_CLINIC_OR_DEPARTMENT_OTHER): Payer: Self-pay | Admitting: Urology

## 2022-04-12 ENCOUNTER — Other Ambulatory Visit: Payer: Self-pay

## 2022-04-12 ENCOUNTER — Ambulatory Visit (HOSPITAL_BASED_OUTPATIENT_CLINIC_OR_DEPARTMENT_OTHER): Payer: Medicare PPO | Admitting: Anesthesiology

## 2022-04-12 ENCOUNTER — Ambulatory Visit (HOSPITAL_BASED_OUTPATIENT_CLINIC_OR_DEPARTMENT_OTHER)
Admission: RE | Admit: 2022-04-12 | Discharge: 2022-04-12 | Disposition: A | Discharge status: Home / Self Care | Payer: Medicare PPO | Attending: Urology | Admitting: Urology

## 2022-04-12 DIAGNOSIS — J45909 Unspecified asthma, uncomplicated: Secondary | ICD-10-CM | POA: Diagnosis not present

## 2022-04-12 DIAGNOSIS — I10 Essential (primary) hypertension: Secondary | ICD-10-CM

## 2022-04-12 DIAGNOSIS — G4733 Obstructive sleep apnea (adult) (pediatric): Secondary | ICD-10-CM

## 2022-04-12 DIAGNOSIS — I2510 Atherosclerotic heart disease of native coronary artery without angina pectoris: Secondary | ICD-10-CM | POA: Insufficient documentation

## 2022-04-12 DIAGNOSIS — C679 Malignant neoplasm of bladder, unspecified: Secondary | ICD-10-CM | POA: Diagnosis not present

## 2022-04-12 DIAGNOSIS — G4730 Sleep apnea, unspecified: Secondary | ICD-10-CM | POA: Diagnosis not present

## 2022-04-12 DIAGNOSIS — Z79899 Other long term (current) drug therapy: Secondary | ICD-10-CM | POA: Insufficient documentation

## 2022-04-12 DIAGNOSIS — I340 Nonrheumatic mitral (valve) insufficiency: Secondary | ICD-10-CM | POA: Insufficient documentation

## 2022-04-12 DIAGNOSIS — C672 Malignant neoplasm of lateral wall of bladder: Secondary | ICD-10-CM

## 2022-04-12 HISTORY — PX: TRANSURETHRAL RESECTION OF BLADDER TUMOR WITH MITOMYCIN-C: SHX6459

## 2022-04-12 LAB — POCT I-STAT, CHEM 8
BUN: 23 mg/dL (ref 8–23)
Calcium, Ion: 1.26 mmol/L (ref 1.15–1.40)
Chloride: 103 mmol/L (ref 98–111)
Creatinine, Ser: 0.7 mg/dL (ref 0.61–1.24)
Glucose, Bld: 107 mg/dL — ABNORMAL HIGH (ref 70–99)
HCT: 43 % (ref 39.0–52.0)
Hemoglobin: 14.6 g/dL (ref 13.0–17.0)
Potassium: 3.7 mmol/L (ref 3.5–5.1)
Sodium: 143 mmol/L (ref 135–145)
TCO2: 26 mmol/L (ref 22–32)

## 2022-04-12 SURGERY — TRANSURETHRAL RESECTION OF BLADDER TUMOR WITH MITOMYCIN-C
Anesthesia: General

## 2022-04-12 MED ORDER — PROPOFOL 10 MG/ML IV BOLUS
INTRAVENOUS | Status: AC
  Filled 2022-04-12: qty 20

## 2022-04-12 MED ORDER — CEFAZOLIN SODIUM-DEXTROSE 2-4 GM/100ML-% IV SOLN: 2.0000 g | INTRAVENOUS | Status: DC

## 2022-04-12 MED ORDER — ACETAMINOPHEN 500 MG PO TABS
1000.0000 mg | ORAL_TABLET | Freq: Once | ORAL | Status: AC
  Administered 2022-04-12: 1000 mg via ORAL

## 2022-04-12 MED ORDER — ONDANSETRON HCL 4 MG/2ML IJ SOLN
INTRAMUSCULAR | Status: AC
  Filled 2022-04-12: qty 2

## 2022-04-12 MED ORDER — FENTANYL CITRATE (PF) 100 MCG/2ML IJ SOLN
25.0000 ug | INTRAMUSCULAR | Status: DC | PRN
  Administered 2022-04-12: 25 ug via INTRAVENOUS

## 2022-04-12 MED ORDER — LACTATED RINGERS IV SOLN: INTRAVENOUS | Status: DC

## 2022-04-12 MED ORDER — LIDOCAINE HCL (PF) 2 % IJ SOLN
INTRAMUSCULAR | Status: AC
  Filled 2022-04-12: qty 5

## 2022-04-12 MED ORDER — FENTANYL CITRATE (PF) 100 MCG/2ML IJ SOLN
INTRAMUSCULAR | Status: DC | PRN
  Administered 2022-04-12: 50 ug via INTRAVENOUS

## 2022-04-12 MED ORDER — GEMCITABINE CHEMO FOR BLADDER INSTILLATION 2000 MG
2000.0000 mg | Freq: Once | INTRAVENOUS | Status: AC
  Administered 2022-04-12: 2000 mg via INTRAVESICAL
  Filled 2022-04-12: qty 2000

## 2022-04-12 MED ORDER — ONDANSETRON HCL 4 MG/2ML IJ SOLN
INTRAMUSCULAR | Status: DC | PRN
  Administered 2022-04-12: 4 mg via INTRAVENOUS

## 2022-04-12 MED ORDER — CIPROFLOXACIN IN D5W 400 MG/200ML IV SOLN
INTRAVENOUS | Status: AC
  Filled 2022-04-12: qty 200

## 2022-04-12 MED ORDER — FENTANYL CITRATE (PF) 100 MCG/2ML IJ SOLN
INTRAMUSCULAR | Status: AC
  Filled 2022-04-12: qty 2

## 2022-04-12 MED ORDER — PROPOFOL 10 MG/ML IV BOLUS
INTRAVENOUS | Status: DC | PRN
  Administered 2022-04-12: 150 mg via INTRAVENOUS

## 2022-04-12 MED ORDER — SODIUM CHLORIDE 0.9 % IR SOLN
Status: DC | PRN
  Administered 2022-04-12: 3000 mL
  Administered 2022-04-12: 1000 mL
  Administered 2022-04-12: 3000 mL

## 2022-04-12 MED ORDER — CIPROFLOXACIN IN D5W 400 MG/200ML IV SOLN
400.0000 mg | Freq: Once | INTRAVENOUS | Status: AC
  Administered 2022-04-12: 400 mg via INTRAVENOUS

## 2022-04-12 MED ORDER — DEXAMETHASONE SODIUM PHOSPHATE 10 MG/ML IJ SOLN
INTRAMUSCULAR | Status: DC | PRN
  Administered 2022-04-12: 5 mg via INTRAVENOUS

## 2022-04-12 MED ORDER — ACETAMINOPHEN 500 MG PO TABS
ORAL_TABLET | ORAL | Status: AC
  Filled 2022-04-12: qty 2

## 2022-04-12 MED ORDER — DEXAMETHASONE SODIUM PHOSPHATE 10 MG/ML IJ SOLN
INTRAMUSCULAR | Status: AC
  Filled 2022-04-12: qty 1

## 2022-04-12 MED ORDER — LIDOCAINE 2% (20 MG/ML) 5 ML SYRINGE
INTRAMUSCULAR | Status: DC | PRN
  Administered 2022-04-12: 80 mg via INTRAVENOUS

## 2022-04-12 SURGICAL SUPPLY — 31 items
BAG DRAIN URO-CYSTO SKYTR STRL (DRAIN) ×2 IMPLANT
BAG DRN RND TRDRP ANRFLXCHMBR (UROLOGICAL SUPPLIES) ×1
BAG DRN UROCATH (DRAIN) ×1
BAG URINE DRAIN 2000ML AR STRL (UROLOGICAL SUPPLIES) ×1 IMPLANT
BAG URINE LEG 500ML (DRAIN) IMPLANT
CATH FOLEY 2WAY SLVR  5CC 18FR (CATHETERS) ×1
CATH FOLEY 2WAY SLVR  5CC 20FR (CATHETERS)
CATH FOLEY 2WAY SLVR  5CC 22FR (CATHETERS)
CATH FOLEY 2WAY SLVR 5CC 18FR (CATHETERS) IMPLANT
CATH FOLEY 2WAY SLVR 5CC 20FR (CATHETERS) IMPLANT
CATH FOLEY 2WAY SLVR 5CC 22FR (CATHETERS) IMPLANT
CATH FOLEY 3WAY 30CC 22FR (CATHETERS) IMPLANT
CLOTH BEACON ORANGE TIMEOUT ST (SAFETY) ×2 IMPLANT
ELECT REM PT RETURN 9FT ADLT (ELECTROSURGICAL) ×2
ELECTRODE REM PT RTRN 9FT ADLT (ELECTROSURGICAL) ×1 IMPLANT
EVACUATOR MICROVAS BLADDER (UROLOGICAL SUPPLIES) IMPLANT
GLOVE BIO SURGEON STRL SZ8 (GLOVE) ×2 IMPLANT
GOWN STRL REUS W/TWL XL LVL3 (GOWN DISPOSABLE) ×2 IMPLANT
HOLDER FOLEY CATH W/STRAP (MISCELLANEOUS) IMPLANT
IV NS IRRIG 3000ML ARTHROMATIC (IV SOLUTION) ×4 IMPLANT
KIT TURNOVER CYSTO (KITS) ×2 IMPLANT
LOOP CUT BIPOLAR 24F LRG (ELECTROSURGICAL) ×2 IMPLANT
MANIFOLD NEPTUNE II (INSTRUMENTS) ×2 IMPLANT
NS IRRIG 500ML POUR BTL (IV SOLUTION) ×2 IMPLANT
PACK CYSTO (CUSTOM PROCEDURE TRAY) ×2 IMPLANT
PLUG CATH AND CAP STER (CATHETERS) IMPLANT
SYR TOOMEY IRRIG 70ML (MISCELLANEOUS) ×2
SYRINGE TOOMEY IRRIG 70ML (MISCELLANEOUS) ×1 IMPLANT
TUBE CONNECTING 12X1/4 (SUCTIONS) ×2 IMPLANT
TUBING UROLOGY SET (TUBING) ×1 IMPLANT
WATER STERILE IRR 500ML POUR (IV SOLUTION) IMPLANT

## 2022-04-12 NOTE — Interval H&P Note (Signed)
History and Physical Interval Note:  04/12/2022 8:38 AM  David Jennings  has presented today for surgery, with the diagnosis of BLADDER CANCER.  The various methods of treatment have been discussed with the patient and family. After consideration of risks, benefits and other options for treatment, the patient has consented to  Procedure(s): REPEAT TRANSURETHRAL RESECTION OF BLADDER TUMOR WITH GEMCITABINE (N/A) as a surgical intervention.  The patient's history has been reviewed, patient examined, no change in status, stable for surgery.  I have reviewed the patient's chart and labs.  Questions were answered to the patient's satisfaction.     Lillette Boxer Nilton Lave

## 2022-04-12 NOTE — Anesthesia Procedure Notes (Signed)
Procedure Name: LMA Insertion Date/Time: 04/12/2022 8:55 AM  Performed by: Suan Halter, CRNAPre-anesthesia Checklist: Patient identified, Emergency Drugs available, Suction available and Patient being monitored Patient Re-evaluated:Patient Re-evaluated prior to induction Oxygen Delivery Method: Circle system utilized Preoxygenation: Pre-oxygenation with 100% oxygen Induction Type: IV induction Ventilation: Mask ventilation without difficulty LMA: LMA inserted LMA Size: 4.0 Number of attempts: 1 Airway Equipment and Method: Bite block Placement Confirmation: positive ETCO2 Tube secured with: Tape Dental Injury: Teeth and Oropharynx as per pre-operative assessment

## 2022-04-12 NOTE — Transfer of Care (Signed)
Immediate Anesthesia Transfer of Care Note  Patient: David Jennings  Procedure(s) Performed: Procedure(s) (LRB): REPEAT TRANSURETHRAL RESECTION OF BLADDER TUMOR WITH GEMCITABINE (N/A)  Patient Location: PACU  Anesthesia Type: General  Level of Consciousness: awake, oriented, sedated and patient cooperative  Airway & Oxygen Therapy: Patient Spontanous Breathing and Patient connected to face mask oxygen  Post-op Assessment: Report given to PACU RN and Post -op Vital signs reviewed and stable  Post vital signs: Reviewed and stable  Complications: No apparent anesthesia complications Last Vitals:  Vitals Value Taken Time  BP 124/71 04/12/22 0930  Temp    Pulse 65 04/12/22 0931  Resp 15 04/12/22 0931  SpO2 95 % 04/12/22 0931  Vitals shown include unvalidated device data.  Last Pain:  Vitals:   04/12/22 0630  TempSrc: Oral         Complications: No notable events documented.

## 2022-04-12 NOTE — Discharge Instructions (Addendum)
You may see some blood in the urine and may have some burning with urination for 48-72 hours. You also may notice that you have to urinate more frequently or urgently after your procedure which is normal.  You should call should you develop an inability urinate, fever > 101, persistent nausea and vomiting that prevents you from eating or drinking to stay hydrated.  If you have a catheter, you will be taught how to take care of the catheter by the nursing staff prior to discharge from the hospital.  You may periodically feel a strong urge to void with the catheter in place.  This is a bladder spasm and most often can occur when having a bowel movement or moving around. It is typically self-limited and usually will stop after a few minutes.  You may use some Vaseline or Neosporin around the tip of the catheter to reduce friction at the tip of the penis. You may also see some blood in the urine.  A very small amount of blood can make the urine look quite red.  As long as the catheter is draining well, there usually is not a problem.  However, if the catheter is not draining well and is bloody, you should call the office [phone] to notify us.  It is okay to take the catheter out on Friday morning.   Post Anesthesia Home Care Instructions  Activity: Get plenty of rest for the remainder of the day. A responsible individual must stay with you for 24 hours following the procedure.  For the next 24 hours, DO NOT: -Drive a car -Paediatric nurse -Drink alcoholic beverages -Take any medication unless instructed by your physician -Make any legal decisions or sign important papers.  Meals: Start with liquid foods such as gelatin or soup. Progress to regular foods as tolerated. Avoid greasy, spicy, heavy foods. If nausea and/or vomiting occur, drink only clear liquids until the nausea and/or vomiting subsides. Call your physician if vomiting continues.  Special Instructions/Symptoms: Your throat may  feel dry or sore from the anesthesia or the breathing tube placed in your throat during surgery. If this causes discomfort, gargle with warm salt water. The discomfort should disappear within 24 hours.  No acetaminophen/Tylenol until after 1:00 pm today if needed.

## 2022-04-12 NOTE — H&P (Signed)
H&P  Chief Complaint: History of bladder cancer  History of Present Illness: 81 year old male presents at this time for repeat resection for history of high-grade nonmuscle invasive bladder cancer.  Initial TURBT performed on 03/05/2022.  Past Medical History:  Diagnosis Date   Arthritis    Asthma    Cancer ([place]) 2010   prostate CA   Coronary artery disease    COVID-19    Dyspnea    Eczema    Hypertension    Sleep apnea    uses cpap   Stroke [facility]) 2019    Past Surgical History:  Procedure Laterality Date   ANKLE SURGERY Right 2020   BACK SURGERY  1960   COLONOSCOPY     LUMBAR LAMINECTOMY/DECOMPRESSION MICRODISCECTOMY N/A 04/26/2021   Procedure: L2-3 & L3-4 DECOMPRESSION; LEFT L5-S1 FORAMINOTOMY;  Surgeon: Meade Maw, MD;  Location: [facility] ORS;  Service: Neurosurgery;  Laterality: N/A;   MOHS SURGERY     basil cell   PROSTATE SURGERY  2012   seed implant   REPLACEMENT TOTAL KNEE BILATERAL Bilateral 2018   2017   RIGHT/LEFT HEART CATH AND CORONARY ANGIOGRAPHY N/A 10/05/2021   Procedure: RIGHT/LEFT HEART CATH AND CORONARY ANGIOGRAPHY;  Surgeon: Yolonda Kida, MD;  Location: [facility];  Service: Cardiovascular;  Laterality: N/A;   TONSILLECTOMY     as a child   TRANSURETHRAL RESECTION OF BLADDER TUMOR WITH MITOMYCIN-C N/A 03/05/2022   Procedure: TRANSURETHRAL RESECTION OF BLADDER TUMOR WITH GEMCITABINE;  Surgeon: Franchot Gallo, MD;  Location: [facility] ORS;  Service: Urology;  Laterality: N/A;    Home Medications:    Allergies:  Allergies  Allergen Reactions   Cefazolin Rash   Tape Rash    Band-Aid causes redness    History reviewed. No pertinent family history.  Social History:  reports that he has never smoked. He has never used smokeless tobacco. He reports current alcohol use. He reports that he does not use drugs.  ROS: A complete review of systems was performed.  All systems are negative except for pertinent findings as noted.  Physical  Exam:  Vital signs in last 24 hours: BP (!) 149/78   Pulse 63   Temp (!) 97.4 F (36.3 C) (Oral)   Resp 18   Ht '5\' 8"'$  (1.727 m)   Wt 109.4 kg   SpO2 99%   BMI 36.67 kg/m  Constitutional:  Alert and oriented, No acute distress Cardiovascular: Regular rate  Respiratory: Normal respiratory effort Lymphatic: No lymphadenopathy Neurologic: Grossly intact, no focal deficits Psychiatric: Normal mood and affect  I have reviewed prior pt notes  I have reviewed urinalysis results     Impression/Assessment: High-grade nonmuscle invasive bladder cancer  Plan:  Repeat resection

## 2022-04-12 NOTE — Anesthesia Postprocedure Evaluation (Signed)
Anesthesia Post Note  Patient: Marcanthony Sleight Vanhouten  Procedure(s) Performed: REPEAT TRANSURETHRAL RESECTION OF BLADDER TUMOR WITH GEMCITABINE     Patient location during evaluation: PACU Anesthesia Type: General Level of consciousness: awake and alert Pain management: pain level controlled Vital Signs Assessment: post-procedure vital signs reviewed and stable Respiratory status: spontaneous breathing, nonlabored ventilation, respiratory function stable and patient connected to nasal cannula oxygen Cardiovascular status: blood pressure returned to baseline and stable Postop Assessment: no apparent nausea or vomiting Anesthetic complications: no   No notable events documented.  Last Vitals:  Vitals:   04/12/22 0630 04/12/22 0930  BP: (!) 149/78 124/71  Pulse: 63 63  Resp: 18 12  Temp: (!) 36.3 C (!) 36.4 C  SpO2: 99% 94%    Last Pain:  Vitals:   04/12/22 0930  TempSrc:   PainSc: 0-No pain                 Vickie Ponds L Chiyo Fay

## 2022-04-12 NOTE — Anesthesia Preprocedure Evaluation (Addendum)
Anesthesia Evaluation  Patient identified by MRN, date of birth, ID band Patient awake    Reviewed: Allergy & Precautions, NPO status , Patient's Chart, lab work & pertinent test results, reviewed documented beta blocker date and time   Airway Mallampati: II  TM Distance: >3 FB Neck ROM: Full    Dental  (+) Dental Advisory Given, Chipped,    Pulmonary shortness of breath, asthma , sleep apnea and Continuous Positive Airway Pressure Ventilation ,    Pulmonary exam normal breath sounds clear to auscultation       Cardiovascular hypertension, Pt. on medications and Pt. on home beta blockers + CAD  Normal cardiovascular exam Rhythm:Regular Rate:Normal  TTE 2019 - Left ventricle: The cavity size was normal. Wall thickness was  normal. Systolic function was normal. The estimated ejection  fraction was in the range of 55% to 65%. Left ventricular  diastolic function parameters were normal.  - Mitral valve: There was mild regurgitation.  Cath 2023 Conclusion Mild significant pulmonary hypertension Normal left ventricular function of at least 55% Coronaries Significant single-vessel coronary disease mid RCA Minor irregularities of the left system Recommend referral to tertiary care center for possible intervention of mid RCA    Neuro/Psych CVA, No Residual Symptoms negative psych ROS   GI/Hepatic negative GI ROS, Neg liver ROS,   Endo/Other  negative endocrine ROS  Renal/GU negative Renal ROS  negative genitourinary   Musculoskeletal  (+) Arthritis ,   Abdominal   Peds  Hematology  (+) Blood dyscrasia (on plavix), ,   Anesthesia Other Findings   Reproductive/Obstetrics                           Anesthesia Physical Anesthesia Plan  ASA: 3  Anesthesia Plan: General   Post-op Pain Management: Tylenol PO (pre-op)*   Induction: Intravenous  PONV Risk Score and Plan: 2 and  Dexamethasone and Ondansetron  Airway Management Planned: Oral ETT and LMA  Additional Equipment:   Intra-op Plan:   Post-operative Plan: Extubation in OR  Informed Consent: I have reviewed the patients History and Physical, chart, labs and discussed the procedure including the risks, benefits and alternatives for the proposed anesthesia with the patient or authorized representative who has indicated his/her understanding and acceptance.     Dental advisory given  Plan Discussed with: CRNA  Anesthesia Plan Comments:         Anesthesia Quick Evaluation

## 2022-04-12 NOTE — Op Note (Signed)
Preoperative diagnosis: Nonmuscle invasive bladder cancer, status post resection recently  Postoperative diagnosis: Same  Principal procedure: Repeat TURBT 2 cm bladder tumor, placement of intravesical gemcitabine  Surgeon: Dawnyel Leven  Anesthesia: General with LMA  Complications: None  Specimen: Bladder tumor rebiopsy/fragments  Estimated blood loss: Less than 5 mL  Indications: 81 year old male with history of high-grade nonmuscle invasive bladder cancer.  Diagnosed upon evaluation for gross hematuria.  He underwent initial resection on 5 June.  He presents at this time for repeat resection per protocol.  Findings: Urethra was normal.  Prostate with minimal obstruction.  Median lobe present.  Bladder appeared as before on prior cystoscopy.  No urothelial lesions were noted.  Old resection site with fibrinous material in the right posterior lateral bladder area above the trigone.  Ureteral orifices normal.  Description of procedure: The patient was properly identified in the holding area.  Taken to the operating room where general anesthetic was administered with the LMA.  Placed in the dorsolithotomy position, genitalia, perineum prepped and draped, IV antibiotics administered.  Timeout performed.  Urethra dilated to 88 [place] with [employer] sounds.  Resectoscope sheath placed using the visual obturator.  The resectoscope and the bipolar cutting loop were placed.  Following inspection thoroughly of the bladder, the old resection site, approximately 2 cm in diameter, was reresected.  Fragments sent for pathology.  The resected area was then cauterized, hemostasis was excellent.  The scope was removed, 69 [place] Foley catheter placed, hooked to dependent drainage.  The patient was then awakened and taken to the PACU in stable condition.  In the PACU, 2 g of gemcitabine was instilled and left indwelling for an hour, then drained.

## 2022-04-13 ENCOUNTER — Encounter (HOSPITAL_BASED_OUTPATIENT_CLINIC_OR_DEPARTMENT_OTHER): Payer: Self-pay | Admitting: Urology

## 2022-04-13 LAB — SURGICAL PATHOLOGY

## 2022-10-04 ENCOUNTER — Encounter: Payer: Medicare PPO | Attending: Physician Assistant | Admitting: Physician Assistant

## 2022-10-04 DIAGNOSIS — I10 Essential (primary) hypertension: Secondary | ICD-10-CM | POA: Diagnosis not present

## 2022-10-04 DIAGNOSIS — J45909 Unspecified asthma, uncomplicated: Secondary | ICD-10-CM | POA: Insufficient documentation

## 2022-10-04 DIAGNOSIS — I2510 Atherosclerotic heart disease of native coronary artery without angina pectoris: Secondary | ICD-10-CM | POA: Diagnosis not present

## 2022-10-04 DIAGNOSIS — L97812 Non-pressure chronic ulcer of other part of right lower leg with fat layer exposed: Secondary | ICD-10-CM | POA: Insufficient documentation

## 2022-10-04 DIAGNOSIS — I480 Paroxysmal atrial fibrillation: Secondary | ICD-10-CM | POA: Insufficient documentation

## 2022-10-04 DIAGNOSIS — Z7901 Long term (current) use of anticoagulants: Secondary | ICD-10-CM | POA: Insufficient documentation

## 2022-10-05 NOTE — Progress Notes (Signed)
ARIC, JOST ([id number]) [phone] [id number] Page 1 of 5 Visit Report for 10/04/2022 Abuse Risk Screen Details Patient Name: Date of Service: David [name], [name] 10/04/2022 8:45 David M Medical Record Number: [id number] Patient Account Number: [id number] Date of Birth/Sex: Treating RN: [birth year] (81 y.o. David Jennings) David Jennings Primary Care David Jennings: David Jennings Other Clinician: Referring David Jennings: Treating David Jennings/Extender: David Jennings Self, Referral Weeks in Treatment: 0 Abuse Risk Screen Items Answer ABUSE RISK SCREEN: Has anyone close to you tried to hurt or harm you recentlyo No Do you feel uncomfortable with anyone in your familyo No Has anyone forced you do things that you didnt want to doo No Electronic Signature(s) Signed: 10/04/2022 5:05:00 PM By: David Coria RN Entered By: David Jennings on 10/04/2022 08:55:28 -------------------------------------------------------------------------------- Activities of Daily Living Details Patient Name: Date of Service: David [name], [name] 10/04/2022 8:45 David M Medical Record Number: [id number] Patient Account Number: [id number] Date of Birth/Sex: Treating RN: [birth year] (81 y.o. David Jennings) David Jennings Primary Care Altie Savard: David Jennings Other Clinician: Referring Iyahna Obriant: Treating David Jennings/Extender: David Jennings Self, Referral Weeks in Treatment: 0 Activities of Daily Living Items Answer Activities of Daily Living (Please select one for each item) Drive Automobile Completely Able T Medications ake Completely Able Use T elephone Completely Able Care for Appearance Completely Able Use T oilet Completely Able Bath / Shower Completely Able Dress Self Completely Able Feed Self Completely Able Walk Completely Able Get In / Out Bed Completely Able Housework Completely Able David Jennings ([id number]) [phone] [id number] Page 2 of 5 Prepare Meals Completely Able Handle Money Completely  Able Shop for Self Completely Able Electronic Signature(s) Signed: 10/04/2022 5:05:00 PM By: David Coria RN Entered By: David Jennings on 10/04/2022 08:55:48 -------------------------------------------------------------------------------- Education Screening Details Patient Name: Date of Service: David Johns, [name] 10/04/2022 8:45 David M Medical Record Number: [id number] Patient Account Number: [id number] Date of Birth/Sex: Treating RN: [birth year] (81 y.o. David Jennings) David Jennings Primary Care Christain Niznik: David Jennings Other Clinician: Referring Zaylee Cornia: Treating David Jennings/Extender: David Jennings Self, Referral Weeks in Treatment: 0 Primary Learner Assessed: Patient Learning Preferences/Education Level/Primary Language Learning Preference: Explanation Highest Education Level: College or Above Preferred Language: English Cognitive Barrier Language Barrier: No Translator Needed: No Memory Deficit: No Emotional Barrier: No Cultural/Religious Beliefs Affecting Medical Care: No Physical Barrier Impaired Vision: Yes Glasses Impaired Hearing: No Decreased Hand dexterity: No Knowledge/Comprehension Knowledge Level: Medium Comprehension Level: High Ability to understand written instructions: High Ability to understand verbal instructions: High Motivation Anxiety Level: Anxious Cooperation: Cooperative Education Importance: Acknowledges Need Interest in Health Problems: Asks Questions Perception: Coherent Willingness to Engage in Self-Management High Activities: Readiness to Engage in Self-Management High Activities: Electronic Signature(s) Signed: 10/04/2022 5:05:00 PM By: David Coria RN Entered By: David Jennings on 10/04/2022 08:56:13 David Jennings ([id number]) [id number] Page 3 of 5 -------------------------------------------------------------------------------- Fall Risk Assessment Details Patient Name: Date of Service: David [name], [name]  10/04/2022 8:45 David M Medical Record Number: [id number] Patient Account Number: [id number] Date of Birth/Sex: Treating RN: 04/04/1941 (81 y.o. David Jennings) David Jennings Primary Care Krystena Reitter: David Jennings Other Clinician: Referring Dolce Sylvia: Treating [name] David Jennings Self, Referral Weeks in Treatment: 0 Fall Risk Assessment Items Have you had 2 or more falls in the last 12 monthso 0 No Have you had any fall that resulted in injury in the last 12 monthso 0 No FALLS RISK SCREEN History of falling - immediate or within 3 months 0 No Secondary diagnosis (Do you have 2 or  more medical diagnoseso) 0 No Ambulatory aid None/bed rest/wheelchair/nurse 0 Yes Crutches/cane/walker 0 No Furniture 0 No Intravenous therapy Access/Saline/Heparin Lock 0 No Gait/Transferring Normal/ bed rest/ wheelchair 0 Yes Weak (short steps with or without shuffle, stooped but able to lift head while walking, may seek 0 No support from furniture) Impaired (short steps with shuffle, may have difficulty arising from chair, head down, impaired 0 No balance) Mental Status Oriented to own ability 0 Yes Electronic Signature(s) Signed: 10/04/2022 5:05:00 PM By: David Coria RN Entered By: David Jennings on 10/04/2022 08:56:56 -------------------------------------------------------------------------------- Foot Assessment Details Patient Name: Date of Service: David Johns, [name] 10/04/2022 8:45 David M Medical Record Number: [id number] Patient Account Number: [id number] Date of Birth/Sex: Treating RN: [birth year] (81 y.o. David Jennings) David Jennings Primary Care Alistair Senft: David Jennings Other Clinician: Referring Johnda Billiot: Treating [name] David Jennings Self, Referral Weeks in Treatment: 0 Foot Assessment Items Site Locations [place], [place] J ([id number]) [id number] Page 4 of 5 + = Sensation present, - = Sensation absent, C = Callus, U = Ulcer R = Redness, W = Warmth, M = Maceration,  PU = Pre-ulcerative lesion F = Fissure, S = Swelling, D = Dryness Assessment Right: Left: Other Deformity: No No Prior Foot Ulcer: No No Prior Amputation: No No Charcot Joint: No No Ambulatory Status: Ambulatory Without Help Gait: Steady Electronic Signature(s) Signed: 10/04/2022 5:05:00 PM By: David Coria RN Entered By: David Jennings on 10/04/2022 09:10:49 -------------------------------------------------------------------------------- Nutrition Risk Screening Details Patient Name: Date of Service: David [name], [name] 10/04/2022 8:45 David M Medical Record Number: [id number] Patient Account Number: [id number] Date of Birth/Sex: Treating RN: [birth year] (81 y.o. David Jennings) David Jennings Primary Care Jamaurion Slemmer: David Jennings Other Clinician: Referring Aarya Quebedeaux: Treating Shallen Luedke/Extender: David Jennings Self, Referral Weeks in Treatment: 0 Height (in): 69 Weight (lbs): 248 Body Mass Index (BMI): 36.6 Nutrition Risk Screening Items Score Screening NUTRITION RISK SCREEN: I have an illness or condition that made me change the kind and/or amount of food I eat 0 No I eat fewer than two meals per day 0 No I eat few fruits and vegetables, or milk products 0 No I have three or more drinks of beer, liquor or wine almost every day 0 No I have tooth or mouth problems that make it hard for me to eat 0 No I don't always have enough money to buy the food I need 0 No KAELYN, NAUTA ([id number]) [id number] Page 5 of 5 I eat alone most of the time 0 No I take three or more different prescribed or over-the-counter drugs David day 1 Yes Without wanting to, I have lost or gained 10 pounds in the last six months 0 No I am not always physically able to shop, cook and/or feed myself 0 No Nutrition Protocols Good Risk Protocol 0 No interventions needed Moderate Risk Protocol High Risk Proctocol Risk Level: Good Risk Score: 1 Electronic Signature(s) Signed: 10/04/2022 5:05:00 PM  By: David Coria RN Entered By: David Jennings on 10/04/2022 08:57:13

## 2022-10-05 NOTE — Progress Notes (Signed)
EMIN, FOREE ([id number]) [id number] Page 1 of 9 Visit Report for 10/04/2022 Chief Complaint Document Details Patient Name: Date of Service: [name], [name] 10/04/2022 8:45 A M Medical Record Number: [id number] Patient Account Number: [id number] Date of Birth/Sex: Treating RN: 11-Jan-1941 (81 y.o. David Jennings) [name] Primary Care Provider: [name] Other Clinician: Referring Provider: Treating Provider/Extender: Jeri Cos Self, Referral Weeks in Treatment: 0 Information Obtained from: Patient Chief Complaint Right LE Ulcer Electronic Signature(s) Signed: 10/04/2022 9:33:21 AM By: [name] Entered By: Worthy Keeler on 10/04/2022 09:33:21 -------------------------------------------------------------------------------- Debridement Details Patient Name: Date of Service: [name], [name] 10/04/2022 8:45 A M Medical Record Number: [id number] Patient Account Number: [id number] Date of Birth/Sex: Treating RN: 1941/09/19 (81 y.o. David Jennings) [name] Primary Care Provider: [name] Other Clinician: Referring Provider: Treating Provider/Extender: Jeri Cos Self, Referral Weeks in Treatment: 0 Debridement Performed for Assessment: Wound #1 Right,Anterior Lower Leg Performed By: Physician Tommie Sams., PA-C Debridement Type: Debridement Severity of Tissue Pre Debridement: Fat layer exposed Level of Consciousness (Pre-procedure): Awake and Alert Pre-procedure Verification/Time Out Yes - 09:36 Taken: Start Time: 09:36 Pain Control: Lidocaine 4% T opical Solution T Area Debrided (L x W): otal 4 (cm) x 3.5 (cm) = 14 (cm) Tissue and other material debrided: Viable, Non-Viable, Blood Clots, Slough, Subcutaneous, Skin: Dermis , Skin: Epidermis, Slough Level: Skin/Subcutaneous Tissue Debridement Description: Excisional Instrument: Curette Bleeding: Minimum Hemostasis Achieved: Pressure End Time: 09:40 Procedural Pain: 0 Post  Procedural Pain: 0 David Jennings, David Jennings ([id number]) [id number] Page 2 of 9 Response to Treatment: Procedure was tolerated well Level of Consciousness (Post- Awake and Alert procedure): Post Debridement Measurements of Total Wound Length: (cm) 4 Width: (cm) 3.5 Depth: (cm) 0.2 Volume: (cm) 2.199 Character of Wound/Ulcer Post Debridement: Improved Severity of Tissue Post Debridement: Fat layer exposed Post Procedure Diagnosis Same as Pre-procedure Electronic Signature(s) Signed: 10/04/2022 5:05:00 PM By: [name] RN Signed: 10/05/2022 1:38:11 PM By: [name] Entered By: [name] on 10/04/2022 09:39:46 -------------------------------------------------------------------------------- HPI Details Patient Name: Date of Service: David Johns, [name] 10/04/2022 8:45 A M Medical Record Number: [id number] Patient Account Number: [id number] Date of Birth/Sex: Treating RN: [birth year] (81 y.o. David Jennings Primary Care Provider: [name] Other Clinician: Referring Provider: Treating Provider/Extender: Jeri Cos Self, Referral Weeks in Treatment: 0 History of Present Illness HPI Description: 10-04-2022 upon evaluation today patient presents for a wound of his right anterior lower leg which is just below the knee and began on 12-29- 2023. This was initially a strike to the leg which subsequently ended up with what appears to be a hematoma and the patient ended up of course with the open wound that we currently see. He does have long-term use of anticoagulant therapy he is specifically on I believe the Eliquis. He also has atrial fibrillation which is why he is on the blood thinners and likely what led to the wound currently. With that being said he is not currently on any antibiotics. Patient does have a history of cerebrovascular disease, atrial fibrillation, coronary artery disease, and hypertension. Electronic Signature(s) Signed: 10/05/2022  1:26:04 PM By: [name] Entered By: Worthy Keeler on 10/05/2022 13:26:04 -------------------------------------------------------------------------------- Physical Exam Details Patient Name: Date of Service: [name], [name] 10/04/2022 8:45 A M Medical Record Number: [id number] Patient Account Number: [id number] David, Jennings ([id number]) [id number] Page 3 of 9 Date of Birth/Sex: Treating RN: [birth year] (82 y.o. M)  [name] Primary Care Provider: [name] Other Clinician: Referring Provider: Treating Provider/Extender: Jeri Cos Self, Referral Weeks in Treatment: 0 Constitutional patient is hypertensive.. pulse regular and within target range for patient.Marland Kitchen respirations regular, non-labored and within target range for patient.Marland Kitchen temperature within target range for patient.. Well-nourished and well-hydrated in no acute distress. Eyes conjunctiva clear no eyelid edema noted. pupils equal round and reactive to light and accommodation. Ears, Nose, Mouth, and Throat no gross abnormality of ear auricles or external auditory canals. normal hearing noted during conversation. mucus membranes moist. Respiratory normal breathing without difficulty. Cardiovascular 2+ dorsalis pedis/posterior tibialis pulses. no clubbing, cyanosis, significant edema, <3 sec cap refill. Musculoskeletal normal gait and posture. no significant deformity or arthritic changes, no loss or range of motion, no clubbing. Psychiatric this patient is able to make decisions and demonstrates good insight into disease process. Alert and Oriented x 3. pleasant and cooperative. Notes Upon inspection patient's wound bed actually showed signs of good granulation epithelization at this point. Fortunately I David not see any evidence of active infection locally nor systemically which is great news and overall very pleased in that regard. The good news is he seems to have good  arterial flow into the lower extremity ABI of 1.01 and that was excellent today. Electronic Signature(s) Signed: 10/05/2022 1:26:43 PM By: [name] Entered By: Worthy Keeler on 10/05/2022 13:26:42 -------------------------------------------------------------------------------- Physician Orders Details Patient Name: Date of Service: David Johns, [name] 10/04/2022 8:45 A M Medical Record Number: [id number] Patient Account Number: [id number] Date of Birth/Sex: Treating RN: 23-Aug-1941 (81 y.o. 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 - please fax all orders to janci at [facility] at [phone] Return A Bathing/ Shower/ Hygiene May shower; gently cleanse wound with antibacterial soap, rinse and pat dry prior to dressing wounds Anesthetic (Use 'Patient Medications' Section for Anesthetic Order Entry) Lidocaine applied to wound bed Edema Control - Lymphedema / Segmental Compressive Device / Other Elevate, Exercise Daily and A void Standing for Long Periods of Time. Elevate legs to the level of the heart and pump ankles as often as possible Elevate leg(s) parallel to the floor when sitting. Wound Treatment Wound #1  - Lower Leg Wound Laterality: Right, Anterior Cleanser: Byram Ancillary Kit - 15 Day Supply (DME) (Generic) 1 x Per Day/30 Days Discharge Instructions: Use supplies as instructed; Kit contains: (15) Saline Bullets; (15) 3x3 Gauze; 15 pr Gloves Prim Dressing: Xeroform-HBD 2x2 (in/in) (Generic) 1 x Per Day/30 Days ary Discharge Instructions: Apply Xeroform-HBD 2x2 (in/in) as directed Secondary Dressing: (BORDER) Zetuvit Plus SILICONE BORDER Dressing 5x5 (in/in) (DME) (Generic) 1 x Per Day/30 Days Discharge Instructions: Please David not put silicone bordered dressings under wraps. Use non-bordered dressing only. Secured With: Tubigrip Size D, 3x10 (in/yd) 1 x Per Day/30 Days Discharge Instructions: single layer Electronic Signature(s) Signed: 10/04/2022 5:05:00 PM By: [name] RN Signed: 10/05/2022 1:38:11 PM By: Joaquim Lai  III, Kimani Hovis PA-C Entered By: [name] on 10/04/2022 09:55:33 -------------------------------------------------------------------------------- Problem List Details Patient Name: Date of Service: [name], [name] 10/04/2022 8:45 A M Medical Record Number: [id number] Patient Account Number: [id number] Date of Birth/Sex: Treating RN: 16-Jun-1941 (81 y.o. David Jennings) [name] Primary Care Provider: [name] Other Clinician: Referring Provider: Treating Provider/Extender: Jeri Cos Self, Referral Weeks in Treatment: 0 Active Problems ICD-10 Encounter Code Description Active Date MDM Diagnosis I87.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 of right 10/04/2022 No Yes lower extremity L97.812 Non-pressure chronic ulcer of other part of right lower leg with fat layer 10/04/2022 No Yes exposed [place] (primary) hypertension 10/04/2022 No Yes David Jennings, David Jennings ([id number]) [id number] Page 5 of 9 I25.10 Atherosclerotic heart disease of native coronary artery without angina pectoris 10/04/2022 No Yes I67.89 Other  cerebrovascular disease 10/04/2022 No Yes Z79.01 Long term (current) use of anticoagulants 10/04/2022 No Yes I48.0 Paroxysmal atrial fibrillation 10/04/2022 No Yes Inactive Problems Resolved Problems Electronic Signature(s) Signed: 10/04/2022 9:32:57 AM By: [name] Entered By: Worthy Keeler on 10/04/2022 09:32:56 -------------------------------------------------------------------------------- Progress Note Details Patient Name: Date of Service: [name], [name] 10/04/2022 8:45 A M Medical Record Number: [id number] Patient Account Number: [id number] Date of Birth/Sex: Treating RN: [birth year] (81 y.o. David Jennings) [name] Primary Care Provider: [name] Other Clinician: Referring Provider: Treating Provider/Extender: Jeri Cos Self, Referral Weeks in Treatment: 0 Subjective Chief Complaint Information obtained from Patient Right LE Ulcer History of Present Illness (HPI) 10-04-2022 upon evaluation today patient presents for a wound of his right anterior lower leg which is just below the knee and began on 09-28-2022. This was initially a strike to the leg which subsequently ended up with what appears to be a hematoma and the patient ended up of course with the open wound that we currently see. He does have long-term use of anticoagulant therapy he is specifically on I believe the Eliquis. He also has atrial fibrillation which is why he is on the blood thinners and likely what led to the wound currently. With that being said he is not currently on any antibiotics. Patient does have a history of cerebrovascular disease, atrial fibrillation, coronary artery disease, and hypertension. Patient History Allergies cefazolin, adhesive tape Social History Never smoker, Marital Status - Married, Alcohol Use - Rarely, Drug Use - No History, Caffeine Use - Moderate. Medical History Respiratory Patient has history of Asthma Cardiovascular Patient has history of Coronary Artery  Disease, Hypertension Review of Systems (ROS) [address] David Jennings, David Jennings ([id number]) [id number] Page 6 of 9 Complains or has symptoms of Glasses / Contacts. Integumentary (Skin) Complains or has symptoms of Wounds. Oncologic bladder disease Objective Constitutional patient is hypertensive.. pulse regular and within target range for patient.Marland Kitchen respirations regular, non-labored and within target range for patient.Marland Kitchen temperature within target range for patient.. Well-nourished and well-hydrated in no acute distress. Vitals Time Taken: 8:50 AM, Height: 69 in, Source: Stated, Weight: 248 lbs, Source: Stated, BMI: 36.6, Temperature: 97.6 F, Pulse: 63 bpm, Respiratory Rate: 16 breaths/min, Blood Pressure: 148/81 mmHg. Eyes conjunctiva clear no eyelid edema noted. pupils equal round and reactive to light and accommodation. Ears, Nose, Mouth, and Throat no gross abnormality of ear auricles or external auditory canals. normal hearing noted during conversation. mucus membranes moist. Respiratory normal breathing without difficulty. Cardiovascular 2+ dorsalis pedis/posterior tibialis pulses. no clubbing, cyanosis, significant edema, Musculoskeletal normal gait and posture. no significant deformity or arthritic changes, no loss or range of motion,  no clubbing. Psychiatric this patient is able to make decisions and demonstrates good insight into disease process. Alert and Oriented x 3. pleasant and cooperative. General Notes: Upon inspection patient's wound bed actually showed signs of good granulation epithelization at this point. Fortunately I David not see any evidence of active infection locally nor systemically which is great news and overall very pleased in that regard. The good news is he seems to have good arterial flow into the lower extremity ABI of 1.01 and that was excellent today. Integumentary (Hair, Skin) Wound #1 status is Open. Original cause of wound was Skin  T ear/Laceration. The date acquired was: 09/28/2022. The wound is located on the Right,Anterior Lower Leg. The wound measures 4cm length x 3.5cm width x 0.2cm depth; 10.996cm^2 area and 2.199cm^3 volume. There is Fat Layer (Subcutaneous Tissue) exposed. There is no tunneling or undermining noted. There is a medium amount of serosanguineous drainage noted. There is medium (34-66%) pink granulation within the wound bed. There is a medium (34-66%) amount of necrotic tissue within the wound bed including Adherent Slough. 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 There was a Excisional Skin/Subcutaneous Tissue Debridement with a total area of 14 sq cm performed by Tommie Sams., PA-C. With the following instrument(s): Curette to remove Viable and Non-Viable tissue/material. Material removed includes Blood Clots, Subcutaneous Tissue, Slough, Skin: Dermis, and Skin: Epidermis after achieving pain control using Lidocaine 4% Topical Solution. No specimens were taken. A time out was conducted at 09:36, prior to the start of the procedure. A Minimum amount of bleeding was controlled with Pressure. The procedure was tolerated well with a pain level of 0 throughout and a pain level of 0 following the procedure. Post Debridement Measurements: 4cm length x 3.5cm width x 0.2cm depth; 2.199cm^3 volume. Character of Wound/Ulcer Post Debridement is improved.  Severity of Tissue Post Debridement is: Fat layer exposed. Post procedure Diagnosis Wound #1: Same as Pre-Procedure David Jennings, David Jennings ([id number]) [id number] Page 7 of 9 Plan Follow-up Appointments: Return Appointment in 1 week. - please fax all orders to janci at [facility] at [phone] Bathing/ Shower/ Hygiene: May shower; gently cleanse wound with antibacterial soap, rinse and pat dry prior to dressing wounds Anesthetic (Use 'Patient Medications' Section for Anesthetic Order Entry): Lidocaine applied to wound bed Edema Control - Lymphedema / Segmental Compressive Device / Other: Elevate, Exercise Daily and Avoid Standing for Long Periods of Time. Elevate legs to the level of the heart and pump ankles as often as possible Elevate leg(s) parallel to the floor when sitting. WOUND #1: - Lower Leg Wound Laterality: Right, Anterior Cleanser: Byram Ancillary Kit - 15 Day Supply (DME) (Generic) 1 x Per Day/30 Days Discharge Instructions: Use supplies as instructed; Kit contains: (15) Saline Bullets; (15) 3x3 Gauze; 15 pr Gloves Prim Dressing: Xeroform-HBD 2x2 (in/in) (Generic) 1 x Per Day/30 Days ary Discharge Instructions: Apply Xeroform-HBD 2x2 (in/in) as directed Secondary Dressing: (BORDER) Zetuvit Plus SILICONE BORDER Dressing 5x5 (in/in) (DME) (Generic) 1 x Per Day/30 Days Discharge Instructions: Please David not put silicone bordered  dressings under wraps. Use non-bordered dressing only. Secured With: Tubigrip Size D, 3x10 (in/yd) 1 x Per Day/30 Days Discharge Instructions: single layer 1. I am going to suggest that we have the patient go ahead and initiate treatment today with a Xeroform gauze dressing which I think will help to soften up some of the remaining necrotic tissue. 2. I am also can recommend that we continue with the bordered foam dressing to cover for some additional padding. 3. Will use Tubigrip to help with edema control currently. We may go  to a compression wrap going forward depending on what we get cleared off as far as the necrotic tissue is concerned. We will see patient back for reevaluation in 1 week here in the clinic. If anything worsens or changes patient will contact our office for additional recommendations. Electronic Signature(s) Signed: 10/05/2022 1:27:29 PM By: [name] Entered By: Worthy Keeler on 10/05/2022 13:27:28 -------------------------------------------------------------------------------- ROS/PFSH Details Patient Name: Date of Service: [name], [name] 10/04/2022 8:45 A M Medical Record Number: [id number] Patient Account Number: [id number] Date of Birth/Sex: Treating RN: 12/02/1940 (81 y.o. David Jennings) [name] Primary Care Provider: [name] Other Clinician: Referring Provider: Treating Provider/Extender: Jeri Cos Self, Referral Weeks in Treatment: 0 Eyes Complaints and Symptoms: Positive for: Glasses / Contacts Integumentary (Skin) Complaints and Symptoms: Positive for: Wounds Respiratory Medical History: Positive for: Asthma Cardiovascular David Jennings, David Jennings ([id number]) [id number] Page 8 of 9 Medical History: Positive for: Coronary Artery Disease; Hypertension Oncologic Complaints and Symptoms: Review of System Notes: bladder disease Immunizations Pneumococcal Vaccine: Received Pneumococcal Vaccination: Yes Received Pneumococcal Vaccination On or After 60th Birthday: Yes Implantable Devices None Family and Social History Never smoker; Marital Status - Married; Alcohol Use: Rarely; Drug Use: No History; Caffeine Use: Moderate Electronic Signature(s) Signed: 10/04/2022 5:05:00 PM By: [name] RN Signed: 10/05/2022 1:38:11 PM By: [name] Entered By: [name] on 10/04/2022 08:55:19 -------------------------------------------------------------------------------- SuperBill Details Patient Name: Date of Service: David Johns, [name] 10/04/2022 Medical Record Number: [id number] Patient Account Number: [id number] Date of Birth/Sex: Treating RN: [birth year] (81 y.o. 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 I10 Essential (primary) hypertension I25.10 Atherosclerotic heart disease of native coronary artery without angina pectoris I67.89 Other cerebrovascular disease Z79.01 Long term (current) use of anticoagulants I48.0 Paroxysmal atrial fibrillation Facility Procedures : CPT4 Code: [id number] Description: [place] VISIT-LEV 3 EST PT Modifier: Quantity: 1 : CPT4 Code: [id number] Description: 83094 - DEB SUBQ TISSUE 20 SQ CM/< ICD-10 Diagnosis Description L97.812 Non-pressure chronic ulcer of other part of right lower leg with fat layer expo Modifier: sed Quantity: 1 Physician Procedures David Jennings, David Jennings ([id number]): CPT4 Code Description [id number] WC PHYS LEVEL 3 NEW PT ICD-10 Diagnosis Description I87.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 L97.812 Non-pressure chronic ulcer of other part of right lower  leg with fat I10 Essential (primary) hypertension I25.10 Atherosclerotic heart disease of native coronary artery without angi [id number] Page 9 of 9: Quantity Modifier 25 1 of right lower extremity layer exposed na pectoris David Jennings, SCHREUR ([id number]): [id number] - WC PHYS SUBQ TISS 20 SQ CM ICD-10 Diagnosis Description L97.812 Non-pressure chronic ulcer of other part  of right lower leg with fat [id number] Page 9 of 9: 1 layer  exposed Electronic Signature(s) Signed: 10/05/2022 1:27:56 PM By: [name] Entered By: Worthy Keeler on 10/05/2022 13:27:56

## 2022-10-05 NOTE — Progress Notes (Addendum)
KHAM, ZUCKERMAN ([id number]) [id number] Page 1 of 8 Visit Report for 10/04/2022 Allergy List Details Patient Name: Date of Service: [name], [name] 10/04/2022 8:45 A M Medical Record Number: [id number] Patient Account Number: [id number] Date of Birth/Sex: Treating RN: 1940/10/14 (82 y.o. David Jennings) [name] Primary Care Ereka Brau: [name] Other Clinician: Referring Salomon Ganser: Treating Kainat Pizana/Extender: Johny Drilling, Beverely Low Weeks in Treatment: 0 Allergies Active Allergies cefazolin adhesive tape Allergy Notes Electronic Signature(s) Signed: 10/04/2022 5:05:00 PM By: [name] RN Entered By: [name] on 10/04/2022 08:51:41 -------------------------------------------------------------------------------- [facility] Information Details Patient Name: Date of Service: David Johns, [name] 10/04/2022 8:45 A M Medical Record Number: [id number] Patient Account Number: [id number] Date of Birth/Sex: Treating RN: June 28, 1941 (82 y.o. David Jennings Primary Care Kellee Sittner: [name] Other Clinician: Referring Delmas Faucett: Treating Leaha Cuervo/Extender: [name] in Treatment: 0 Visit Information Patient Arrived: Ambulatory Arrival Time: 08:49 Accompanied By: self Transfer Assistance: None Patient Identification Verified: Yes Secondary Verification Process Completed: Yes Patient Requires Transmission-Based Precautions: No Patient Has Alerts: Yes Patient Alerts: Patient on Blood Thinner Electronic Signature(s) KEATEN, MASHEK ([id number]) [id number] Page 2 of 8 Signed: 10/04/2022 5:05:00 PM By: [name] RN Entered By: [name] on 10/04/2022 08:50:00 -------------------------------------------------------------------------------- Clinic Level of Care Assessment Details Patient Name: Date of Service: [name], [name] 10/04/2022 8:45 A M Medical Record Number: [id number] Patient  Account Number: [id number] Date of Birth/Sex: Treating RN: 03/13/1941 (82 y.o. David Jennings) [name] Primary Care Leyla Soliz: [name] Other Clinician: Referring Danicia Terhaar: Treating Charlita Brian/Extender: [name] in Treatment: 0 Clinic Level of Care Assessment Items TOOL 1 Quantity Score X- 1 0 Use when EandM and Procedure is performed on INITIAL visit ASSESSMENTS - Nursing Assessment / Reassessment X- 1 20 General Physical Exam (combine w/ comprehensive assessment (listed just below) when performed on new pt. evals) X- 1 25 Comprehensive Assessment (HX, ROS, Risk Assessments, Wounds Hx, etc.) ASSESSMENTS - Wound and Skin Assessment / Reassessment '[]'$  - 0 Dermatologic / Skin Assessment (not related to wound area) ASSESSMENTS - Ostomy and/or Continence Assessment and Care '[]'$  - 0 Incontinence Assessment and Management '[]'$  - 0 Ostomy Care Assessment and Management (repouching, etc.) PROCESS - Coordination of Care X - Simple Patient / Family Education for ongoing care 1 15 '[]'$  - 0 Complex (extensive) Patient / Family Education for ongoing care X- 1 10 Staff obtains Programmer, systems, Records, T Results / Process Orders est '[]'$  - 0 Staff telephones HHA, Nursing Homes / Clarify orders / etc '[]'$  - 0 Routine Transfer to another Facility (non-emergent condition) '[]'$  - 0 Routine Hospital Admission (non-emergent condition) X- 1 15 New Admissions / Biomedical engineer / Ordering NPWT Apligraf, etc. , '[]'$  - 0 Emergency Hospital Admission (emergent condition) PROCESS - Special Needs '[]'$  - 0 Pediatric / Minor Patient Management '[]'$  - 0 Isolation Patient Management '[]'$  - 0 Hearing / Language / Visual special needs '[]'$  - 0 Assessment of Community assistance (transportation, D/C planning, etc.) '[]'$  - 0 Additional assistance / Altered mentation '[]'$  - 0 Support Surface(s) Assessment (bed, cushion, seat, etc.) INTERVENTIONS - Miscellaneous '[]'$  - 0 External ear exam '[]'$  -  0 Patient Transfer (multiple staff / Civil Service fast streamer / Similar devices) '[]'$  - 0 Simple Staple / Suture removal (25 or less) ED, [place] ([id number]) [phone].pdf Page 3 of 8 '[]'$  - 0 Complex Staple / Suture removal (26 or more) '[]'$  - 0 Hypo/Hyperglycemic Management (do not check if billed separately) X- 1  15 Ankle / Brachial Index (ABI) - do not check if billed separately Has the patient been seen at the hospital within the last three years: Yes Total Score: 100 Level Of Care: New/Established - Level 3 Electronic Signature(s) Signed: 10/04/2022 5:05:00 PM By: [name] RN Entered By: [name] on 10/04/2022 09:44:41 -------------------------------------------------------------------------------- Encounter Discharge Information Details Patient Name: Date of Service: David Johns, [name] 10/04/2022 8:45 A M Medical Record Number: [id number] Patient Account Number: [id number] Date of Birth/Sex: Treating RN: 1941/03/11 (82 y.o. David Jennings) [name] Primary Care Lorilyn Laitinen: [name] Other Clinician: Referring Akari Defelice: Treating Avett Reineck/Extender: [name] in Treatment: 0 Encounter Discharge Information Items Post Procedure Vitals Discharge Condition: Stable Temperature (F): 97.6 Ambulatory Status: Ambulatory Pulse (bpm): 63 Discharge Destination: Home Respiratory Rate (breaths/min): 18 Transportation: Private Auto Blood Pressure (mmHg): 148/81 Accompanied By: self Schedule Follow-up Appointment: Yes Clinical Summary of Care: Electronic Signature(s) Signed: 10/12/2022 11:07:46 AM By: [name] RN Entered By: [name] on 10/12/2022 11:07:45 -------------------------------------------------------------------------------- Lower Extremity Assessment Details Patient Name: Date of Service: [name], [name] 10/04/2022 8:45 A M Medical Record Number: [id number] Patient Account Number: [id number] Date of Birth/Sex: Treating  RN: [birth year] (82 y.o. David Jennings Primary Care Marketta Valadez: [name] Other Clinician: Referring Elmira Olkowski: Treating Vergia Chea/Extender: [name] in Treatment: 0 Edema Assessment A[Left: David, Jennings ([id number])] [Right: [id number] Page 4 of 8] Assessed: [Left: No] [Right: No] Edema: [Left: Ye] [Right: s] Calf Left: Right: Point of Measurement: 34 cm From Medial Instep 42 cm Ankle Left: Right: Point of Measurement: 12 cm From Medial Instep 29 cm Knee To Floor Left: Right: From Medial Instep 42 cm Vascular Assessment Pulses: Dorsalis Pedis Palpable: [Right:Yes] Doppler Audible: [Right:Yes] Blood Pressure: Brachial: [Right:148] Ankle: [Right:Dorsalis Pedis: 150 1.01] Electronic Signature(s) Signed: 10/04/2022 5:05:00 PM By: [name] RN Entered By: [name] on 10/04/2022 09:15:29 -------------------------------------------------------------------------------- Multi Wound Chart Details Patient Name: Date of Service: David Johns, [name] 10/04/2022 8:45 A M Medical Record Number: [id number] Patient Account Number: [id number] Date of Birth/Sex: Treating RN: 1940/10/30 (81 y.o. David Jennings Primary Care Rossanna Spitzley: [name] Other Clinician: Referring Alegandro Macnaughton: Treating Ellise Kovack/Extender: [name] in Treatment: 0 Vital Signs Height(in): 69 Pulse(bpm): 63 Weight(lbs): 248 Blood Pressure(mmHg): 148/81 Body Mass Index(BMI): 36.6 Temperature(F): 97.6 Respiratory Rate(breaths/min): 16 [1:Photos:] [N/A:N/A] Right, Anterior Lower Leg N/A N/A Wound Location: Skin Tear/Laceration N/A N/A Wounding Event: David, Jennings ([id number]) [id number] Page 5 of 8 Venous Leg Ulcer N/A N/A Primary Etiology: Asthma, Coronary Artery Disease, N/A N/A Comorbid History: Hypertension 09/28/2022 N/A N/A Date Acquired: 0 N/A N/A Weeks of Treatment: Open N/A  N/A Wound Status: No N/A N/A Wound Recurrence: 4x3.5x0.2 N/A N/A Measurements L x W x D (cm) 10.996 N/A N/A A (cm) : rea 2.199 N/A N/A Volume (cm) : Full Thickness Without Exposed N/A N/A Classification: Support Structures Medium N/A N/A Exudate Amount: Serosanguineous N/A N/A Exudate Type: red, brown N/A N/A Exudate Color: Medium (34-66%) N/A N/A Granulation Amount: Pink N/A N/A Granulation Quality: Medium (34-66%) N/A N/A Necrotic Amount: Fat Layer (Subcutaneous Tissue): Yes N/A N/A Exposed Structures: Fascia: No Tendon: No Muscle: No Joint: No Bone: No None N/A N/A Epithelialization: Treatment Notes Electronic Signature(s) Signed: 10/04/2022 5:05:00 PM By: [name] RN Entered By: [name] on 10/04/2022 09:36:30 -------------------------------------------------------------------------------- Pain Assessment Details Patient Name: Date of Service: David Johns, [name] 10/04/2022 8:45 A M Medical Record Number: [id number] Patient Account  Number: [id number] Date of Birth/Sex: Treating RN: [birth year] (81 y.o. David Jennings) [name] Primary Care Keryn Nessler: [name] Other Clinician: Referring Channel Papandrea: Treating Quiana Cobaugh/Extender: [name] in Treatment: 0 Active Problems Location of Pain Severity and Description of Pain Patient Has Paino No Site Locations David, Jennings ([id number]) [phone].pdf Page 6 of 8 Pain Management and Medication Current Pain Management: Electronic Signature(s) Signed: 10/04/2022 5:05:00 PM By: [name] RN Entered By: [name] on 10/04/2022 08:50:19 -------------------------------------------------------------------------------- Patient/Caregiver Education Details Patient Name: Date of Service: David Johns, [name] 1/4/2024andnbsp8:45 A M Medical Record Number: [id number] Patient Account Number: [id number] Date of Birth/Gender: Treating RN: 01-18-1941 (81 y.o. David Jennings) [name] Primary Care Physician: [name] Other Clinician: Referring Physician: Treating Physician/Extender: [name] in Treatment: 0 Education Assessment Education Provided To: Patient Education Topics Provided Wound/Skin Impairment: Methods: Explain/Verbal Responses: State content correctly Electronic Signature(s) Signed: 10/04/2022 5:05:00 PM By: [name] RN Entered By: [name] on 10/04/2022 09:45:01 -------------------------------------------------------------------------------- Wound Assessment Details Patient Name: Date of Service: David Johns, [name] 10/04/2022 8:45 A M Medical Record Number: [id number] Patient Account Number: [id number] Date of Birth/Sex: Treating RN: 05-23-1941 (81 y.o. David Jennings Primary Care Dajana Gehrig: [name] Other Clinician: Referring Marielle Mantione: Treating Renell Allum/Extender: [name] in Treatment: 0 Wound Status Wound Number: 1 Primary Etiology: Venous Leg Ulcer Wound Location: Right, Anterior Lower Leg Wound Status: Open David, Jennings ([id number]) [id number] Page 7 of 8 Wounding Event: Skin Tear/Laceration Comorbid History: Asthma, Coronary Artery Disease, Hypertension Date Acquired: 09/28/2022 Weeks Of Treatment: 0 Clustered Wound: No Photos Wound Measurements Length: (cm) 4 Width: (cm) 3.5 Depth: (cm) 0.2 Area: (cm) 10.996 Volume: (cm) 2.199 % Reduction in Area: % Reduction in Volume: Epithelialization: None Tunneling: No Undermining: No Wound Description Classification: Full Thickness Without Exposed Suppor Exudate Amount: Medium Exudate Type: Serosanguineous Exudate Color: red, brown t Structures Foul Odor After Cleansing: No Slough/Fibrino Yes Wound Bed Granulation Amount: Medium (34-66%) Exposed Structure Granulation Quality: Pink Fascia Exposed: No Necrotic Amount: Medium (34-66%) Fat Layer (Subcutaneous Tissue)  Exposed: Yes Necrotic Quality: Adherent Slough Tendon Exposed: No Muscle Exposed: No Joint Exposed: No Bone Exposed: No Electronic Signature(s) Signed: 10/04/2022 5:05:00 PM By: [name] RN Entered By: [name] on 10/04/2022 09:36:18 -------------------------------------------------------------------------------- Vitals Details Patient Name: Date of Service: David Johns, [name] 10/04/2022 8:45 A M Medical Record Number: [id number] Patient Account Number: [id number] Date of Birth/Sex: Treating RN: June 08, 1941 (81 y.o. David Jennings) [name] Primary Care Bradey Luzier: [name] Other Clinician: Referring Lesieli Bresee: Treating Tyge Somers/Extender: [name] in Treatment: 0 Vital Signs Time Taken: 08:50 Temperature (F): 97.6 Height (in): 69 Pulse (bpm): 63 Source: Stated Respiratory Rate (breaths/min): 16 Weight (lbs): 248 Blood Pressure (mmHg): 148/81 David, Jennings ([id number]) [id number] Page 8 of 8 Source: Stated Reference Range: 80 - 120 mg / dl Body Mass Index (BMI): 36.6 Electronic Signature(s) Signed: 10/04/2022 5:05:00 PM By: [name] RN Entered By: [name] on 10/04/2022 08:50:48

## 2022-10-12 ENCOUNTER — Encounter: Payer: Medicare PPO | Admitting: Physician Assistant

## 2022-10-12 DIAGNOSIS — L97812 Non-pressure chronic ulcer of other part of right lower leg with fat layer exposed: Secondary | ICD-10-CM | POA: Diagnosis not present

## 2022-10-12 NOTE — Progress Notes (Addendum)
David Jennings, David Jennings ([id number]) [id number] Page 1 of 7 Visit Report for 10/12/2022 Chief Complaint Document Details Patient Name: Date of Service: David [name], [name] 10/12/2022 8:15 David M Medical Record Number: [id number] Patient Account Number: [id number] Date of Birth/Sex: Treating RN: [birth year] (82 y.o. David Jennings Primary Care Provider: [name] Other Clinician: Referring Provider: Treating Provider/Extender: [name] in Treatment: 1 Information Obtained from: Patient Chief Complaint Right LE Ulcer Electronic Signature(s) Signed: 10/12/2022 8:17:08 AM By: [name] Entered By: Worthy Keeler on 10/12/2022 08:17:08 -------------------------------------------------------------------------------- Debridement Details Patient Name: Date of Service: David Johns, [name] 10/12/2022 8:15 David M Medical Record Number: [id number] Patient Account Number: [id number] Date of Birth/Sex: Treating RN: [birth year] (82 y.o. David Jennings Primary Care Provider: [name] Other Clinician: Referring Provider: Treating Provider/Extender: [name] in Treatment: 1 Debridement Performed for Assessment: Wound #1 Right,Anterior Lower Leg Performed By: Physician Tommie Sams., PA-C Debridement Type: Debridement Severity of Tissue Pre Debridement: Limited to breakdown of skin Level of Consciousness (Pre-procedure): Awake and Alert Pre-procedure Verification/Time Out Yes - 08:41 Taken: T Area Debrided (L x W): otal 3.2 (cm) x 2.8 (cm) = 8.96 (cm) Tissue and other material debrided: Viable, Non-Viable, Slough, Subcutaneous, Biofilm, Slough Level: Skin/Subcutaneous Tissue Debridement Description: Excisional Instrument: Curette Bleeding: Moderate Hemostasis Achieved: Pressure Response to Treatment: Procedure was tolerated well Level of Consciousness (Post- Awake and Alert procedure): Post  Debridement Measurements of Total Wound LENG, David Jennings ([id number]) [id number] Page 2 of 7 Length: (cm) 3.2 Width: (cm) 2.8 Depth: (cm) 0.3 Volume: (cm) 2.111 Character of Wound/Ulcer Post Debridement: Stable Severity of Tissue Post Debridement: Limited to breakdown of skin Post Procedure Diagnosis Same as Pre-procedure Electronic Signature(s) Signed: 10/12/2022 11:19:07 AM By: Gretta Cool, BSN, RN, CWS, Kim RN, BSN Signed: 10/12/2022 1:28:04 PM By: [name] Entered By: Gretta Cool, BSN, RN, CWS, Kim on 10/12/2022 08:42:21 -------------------------------------------------------------------------------- HPI Details Patient Name: Date of Service: David Johns, [name] 10/12/2022 8:15 David M Medical Record Number: [id number] Patient Account Number: [id number] Date of Birth/Sex: Treating RN: August 27, 1941 (81 y.o. David Jennings Primary Care Provider: [name] Other Clinician: Referring Provider: Treating Provider/Extender: [name] in Treatment: 1 History of Present Illness HPI Description: 10-04-2022 upon evaluation today patient presents for David wound of his right anterior lower leg which is just below the knee and began on 12-29- 2023. This was initially David strike to the leg which subsequently ended up with what appears to be David hematoma and the patient ended up of course with the open wound that we currently see. He does have long-term use of anticoagulant therapy he is specifically on I believe the Eliquis. He also has atrial fibrillation which is why he is on the blood thinners and likely what led to the wound currently. With that being said he is not currently on any antibiotics. Patient does have David history of cerebrovascular disease, atrial fibrillation, coronary artery disease, and hypertension. 10-12-2022 upon evaluation today patient appears to be doing well currently in regard to his wound on the leg. He is showing signs of good  improvement. Fortunately there does not appear to be any signs of infection he again is on David blood thinner therefore we are using Xeroform gauze to loosen up David lot of necrotic tissue here and that seems to have done David good job. He does have some sharp debridement necessary today and I think  we can David that without causing any significant bleeding which is good news the more that we get off the better and I think we can keep this moving in the right direction. Electronic Signature(s) Signed: 10/12/2022 8:51:14 AM By: [name] Entered By: Worthy Keeler on 10/12/2022 08:51:13 -------------------------------------------------------------------------------- Physical Exam Details Patient Name: Date of Service: David [name], [name] 10/12/2022 8:15 David M Medical Record Number: [id number] Patient Account Number: [id number] Date of Birth/Sex: Treating RN: 09-13-1941 (82 y.o. David Jennings, David Jennings, David Jennings ([id number]) [id number] Page 3 of 7 Primary Care Provider: [name] Other Clinician: Referring Provider: Treating Provider/Extender: [name] in Treatment: 1 Constitutional Well-nourished and well-hydrated in no acute distress. Respiratory normal breathing without difficulty. Psychiatric this patient is able to make decisions and demonstrates good insight into disease process. Alert and Oriented x 3. pleasant and cooperative. Notes Upon inspection patient's wound bed actually showed signs of good granulation and epithelization at this point. Fortunately I David not see any evidence of infection locally nor systemically which is great news and overall I David think we are headed in the right direction here. Electronic Signature(s) Signed: 10/12/2022 8:51:27 AM By: [name] Entered By: Worthy Keeler on 10/12/2022 08:51:26 -------------------------------------------------------------------------------- Physician Orders  Details Patient Name: Date of Service: David [name], [name] 10/12/2022 8:15 David M Medical Record Number: [id number] Patient Account Number: [id number] Date of Birth/Sex: Treating RN: 1941-01-03 (82 y.o. David Jennings, David Jennings Primary Care Provider: [name] Other Clinician: Referring Provider: Treating Provider/Extender: [name] in Treatment: 1 Verbal / Phone Orders: No Diagnosis Coding ICD-10 Coding Code Description [phone] Chronic venous hypertension (idiopathic) with ulcer and inflammation of right lower extremity L97.812 Non-pressure chronic ulcer of other part of right lower leg with fat layer exposed I10 Essential (primary) hypertension I25.10 Atherosclerotic heart disease of native coronary artery without angina pectoris I67.89 Other cerebrovascular disease Z79.01 Long term (current) use of anticoagulants I48.0 Paroxysmal atrial fibrillation Follow-up Appointments ppointment in 1 week. - please fax all orders to janci at [facility] at [phone] Return David Bathing/ Shower/ Hygiene May shower with wound dressing protected with water repellent cover or cast protector. - Cast protector from [employer] or local pharmacy. Anesthetic (Use 'Patient Medications' Section for Anesthetic Order Entry) Lidocaine applied to wound bed Edema Control - Lymphedema / Segmental Compressive Device / Other Elevate, Exercise Daily and David void Standing for Long Periods of Time. Elevate legs to the level of the heart and pump ankles as often as possible Elevate leg(s) parallel to the floor when sitting. Wound Treatment Wound #1 - Lower Leg Wound Laterality: Right, Anterior HY, SWIATEK ([id number]) [id number] Page 4 of 7 Prim Dressing: Silvercel 4 1/4x 4 1/4 (in/in) 1 x Per Day/30 Days ary Discharge Instructions: Apply Silvercel 4 1/4x 4 1/4 (in/in) as instructed Secondary Dressing: Zetuvit Plus 4x4 (in/in) 1 x Per Day/30 Days Compression Wrap:  3-LAYER WRAP - Profore Lite LF 3 Multilayer Compression Bandaging System 1 x Per Day/30 Days Discharge Instructions: Apply 3 multi-layer wrap as prescribed. Electronic Signature(s) Signed: 10/12/2022 11:19:07 AM By: Gretta Cool, BSN, RN, CWS, Kim RN, BSN Signed: 10/12/2022 1:28:04 PM By: [name] Entered By: Gretta Cool BSN, RN, CWS, Kim on 10/12/2022 09:18:15 -------------------------------------------------------------------------------- Problem List Details Patient Name: Date of Service: David Johns, [name] 10/12/2022 8:15 David M Medical Record Number: [id number] Patient Account Number: [id number] Date of Birth/Sex: Treating RN: 10-19-1940 (81 y.o.  David Jennings Primary Care Provider: [name] Other Clinician: Referring Provider: Treating Provider/Extender: [name] in Treatment: 1 Active Problems ICD-10 Encounter Code Description Active Date MDM Diagnosis I87.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 of right 10/04/2022 No Yes lower extremity L97.812 Non-pressure chronic ulcer of other part of right lower leg with fat layer 10/04/2022 No Yes exposed [place] (primary) hypertension 10/04/2022 No Yes I25.10 Atherosclerotic heart disease of native coronary artery without angina pectoris 10/04/2022 No Yes I67.89 Other cerebrovascular disease 10/04/2022 No Yes Z79.01 Long term (current) use of anticoagulants 10/04/2022 No Yes I48.0 Paroxysmal atrial fibrillation 10/04/2022 No Yes Inactive Problems David Jennings, David Jennings ([id number]) [id number] Page 5 of 7 Resolved Problems Electronic Signature(s) Signed: 10/12/2022 8:17:03 AM By: [name] Entered By: Worthy Keeler on 10/12/2022 08:17:02 -------------------------------------------------------------------------------- Progress Note Details Patient Name: Date of Service: David [name], [name] 10/12/2022 8:15 David M Medical Record Number:  [id number] Patient Account Number: [id number] Date of Birth/Sex: Treating RN: 1941-05-29 (82 y.o. David Jennings Primary Care Provider: [name] Other Clinician: Referring Provider: Treating Provider/Extender: [name] in Treatment: 1 Subjective Chief Complaint Information obtained from Patient Right LE Ulcer History of Present Illness (HPI) 10-04-2022 upon evaluation today patient presents for David wound of his right anterior lower leg which is just below the knee and began on 09-28-2022. This was initially David strike to the leg which subsequently ended up with what appears to be David hematoma and the patient ended up of course with the open wound that we currently see. He does have long-term use of anticoagulant therapy he is specifically on I believe the Eliquis. He also has atrial fibrillation which is why he is on the blood thinners and likely what led to the wound currently. With that being said he is not currently on any antibiotics. Patient does have David history of cerebrovascular disease, atrial fibrillation, coronary artery disease, and hypertension. 10-12-2022 upon evaluation today patient appears to be doing well currently in regard to his wound on the leg. He is showing signs of good improvement. Fortunately there does not appear to be any signs of infection he again is on David blood thinner therefore we are using Xeroform gauze to loosen up David lot of necrotic tissue here and that seems to have done David good job. He does have some sharp debridement necessary today and I think we can David that without causing any significant bleeding which is good news the more that we get off the better and I think we can keep this moving in the right direction. Objective Constitutional Well-nourished and well-hydrated in no acute distress. Vitals Time Taken: 8:05 AM, Height: 69 in, Weight: 248 lbs, BMI: 36.6, Temperature: 97.6 F, Pulse: 64 bpm, Respiratory Rate: 16 breaths/min, Blood  Pressure: 151/76 mmHg. Respiratory normal breathing without difficulty. Psychiatric this patient is able to make decisions and demonstrates good insight into disease process. Alert and Oriented x 3. pleasant and cooperative. General Notes: Upon inspection patient's wound bed actually showed signs of good granulation and epithelization at this point. Fortunately I David not see any evidence of infection locally nor systemically which is great news and overall I David think we are headed in the right direction here. Integumentary (Hair, Skin) Wound #1 status is Open. Original cause of wound was Skin T ear/Laceration. The date acquired was: 09/28/2022. The wound has been in treatment 1 weeks. The wound is located on the [place]  Leg. The wound measures 3.2cm length x 2.8cm width x 0.2cm depth; 7.037cm^2 area and 1.407cm^3 volume. There is Fat Layer (Subcutaneous Tissue) exposed. There is no tunneling or undermining noted. There is David medium amount of serosanguineous drainage noted. The wound margin is flat and intact. There is no granulation within the wound bed. There is David large (67-100%) amount of necrotic tissue within the wound bed including Adherent Slough. David Jennings, David Jennings ([id number]) [id number] Page 6 of 7 General Notes: Patient nicked leg with fingernail. Small laceration improving. 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 There was David Excisional Skin/Subcutaneous Tissue Debridement with David total area of 8.96 sq cm performed by Tommie Sams., PA-C. With the following instrument(s): Curette to remove Viable and Non-Viable tissue/material. Material removed includes Subcutaneous Tissue, Slough, and Biofilm. No specimens were taken. David time out was conducted at 08:41, prior to the start of the procedure. David Moderate amount of bleeding was controlled with Pressure. The procedure was tolerated well. Post Debridement Measurements: 3.2cm length x 2.8cm width x 0.3cm depth; 2.111cm^3 volume. Character of Wound/Ulcer Post Debridement is stable. Severity of Tissue Post Debridement is: Limited to breakdown of skin. Post procedure Diagnosis Wound #1: Same as Pre-Procedure Plan Follow-up Appointments: Return Appointment in 1 week. - please fax all orders to janci at [facility] at [phone] Bathing/ Shower/ Hygiene: May shower; gently cleanse wound with antibacterial soap, rinse and pat dry prior to dressing wounds Anesthetic (Use 'Patient Medications' Section for Anesthetic Order Entry): Lidocaine applied to wound bed Edema Control - Lymphedema / Segmental Compressive Device / Other: Elevate, Exercise Daily and Avoid Standing for Long Periods of Time. Elevate legs to the level of the heart and pump ankles as often as possible Elevate leg(s) parallel to the floor when sitting. WOUND #1: - Lower Leg Wound Laterality: Right, Anterior Prim Dressing: IODOFLEX 0.9% Cadexomer Iodine Pad 1 x Per Day/30 Days ary Discharge Instructions: Apply Iodoflex to wound bed only as directed. Secondary Dressing: Zetuvit Plus 4x4 (in/in) 1 x Per Day/30 Days Com pression Wrap: 3-LAYER WRAP - Profore Lite LF 3 Multilayer Compression Bandaging System 1 x Per Day/30 Days Discharge Instructions: Apply 3 multi-layer wrap as prescribed. 1. I am going to suggest that we have the patient continue to monitor  for any signs of infection or worsening. Obviously if he has any issues he should let me know. 2. We are going to switch to David Iodoflex dressing underneath David 3 layer compression wrap which I think would David well to continue to clear up the necrotic debris the patient is in agreement with that plan. 3. I am also going to suggest again adding the 3 layer compression wrap in place to the Tubigrip which I think should David much better for him. We will see patient back for reevaluation in 1 week here in the clinic. If anything worsens or changes patient will contact our office for additional recommendations. Electronic Signature(s) Signed: 10/12/2022 8:52:03 AM By: [name] Entered By: Worthy Keeler on 10/12/2022 08:52:03 David Jennings,  David Jennings ([id number]) [id number] Page 7 of 7 -------------------------------------------------------------------------------- SuperBill Details Patient Name: Date of Service: David [name], [name] 10/12/2022 Medical Record Number: [id number] Patient Account Number: [id number] Date of Birth/Sex: Treating RN: 11-15-1940 (82 y.o. 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 I10 Essential (primary) hypertension I25.10 Atherosclerotic heart disease of native coronary artery without angina pectoris I67.89 Other cerebrovascular disease Z79.01 Long term (current) use of anticoagulants I48.0 Paroxysmal atrial fibrillation Facility Procedures : CPT4 Code: [id number] Description: [place] - DEB SUBQ TISSUE 20 SQ CM/< ICD-10 Diagnosis Description L97.812 Non-pressure chronic ulcer of  other part of right lower leg with fat layer exp Modifier: osed Quantity: 1 Physician Procedures : CPT4 Code Description Modifier [id number] - WC PHYS SUBQ TISS 20 SQ CM ICD-10 Diagnosis Description S97.530 Non-pressure chronic ulcer of other part of right lower leg with fat layer exposed Quantity: 1 Electronic Signature(s) Signed: 10/12/2022 8:52:35 AM By: [name] Entered By: Worthy Keeler on 10/12/2022 08:52:35

## 2022-10-12 NOTE — Progress Notes (Signed)
[name], [name] ([id number]) [id number] Page 1 of 8 Visit Report for 10/12/2022 Arrival Information Details Patient Name: Date of Service: David [name], [name] 10/12/2022 8:15 David M Medical Record Number: [id number] Patient Account Number: [id number] Date of Birth/Sex: Treating RN: 1941-08-20 (82 y.o. David Jennings Primary Care David Jennings: [name] Other Clinician: Referring Simon Aaberg: Treating David Jennings/Extender: [name] in Treatment: 1 Visit Information History Since Last Visit Added or deleted any medications: No Patient Arrived: Ambulatory Has Dressing in Place as Prescribed: Yes Arrival Time: 08:06 Has Compression in Place as Prescribed: Yes Accompanied By: self Pain Present Now: No Transfer Assistance: None Patient Identification Verified: Yes Secondary Verification Process Completed: Yes Patient Requires Transmission-Based Precautions: No Patient Has Alerts: Yes Patient Alerts: Patient on Blood Thinner Electronic Signature(s) Signed: 10/12/2022 11:19:07 AM By: Gretta Cool, BSN, RN, CWS, Kim RN, BSN Entered By: Gretta Cool, BSN, RN, CWS, Kim on 10/12/2022 08:06:58 -------------------------------------------------------------------------------- Encounter Discharge Information Details Patient Name: Date of Service: David [name], [name] 10/12/2022 8:15 David M Medical Record Number: [id number] Patient Account Number: [id number] Date of Birth/Sex: Treating RN: [birth year] (81 y.o. David Jennings Primary Care David Jennings: [name] Other Clinician: Referring Ayano Douthitt: Treating David Jennings/Extender: [name] in Treatment: 1 Encounter Discharge Information Items Post Procedure Vitals Discharge Condition: Stable Temperature (F): 97.7 Ambulatory Status: Ambulatory Pulse (bpm): 64 Discharge Destination: Home Respiratory Rate (breaths/min): 16 Transportation: Private Auto Blood Pressure (mmHg): 151/76 Accompanied  By: self Schedule Follow-up Appointment: Yes Clinical Summary of Care: Electronic Signature(s) Signed: 10/12/2022 9:13:56 AM By: Gretta Cool, BSN, RN, CWS, Kim RN, BSN [address], [place] J ([id number]) By: Gretta Cool, BSN, RN, CWS, Kim RN, BSN [phone].pdf Page 2 of 8 Signed: 10/12/2022 9:13:56 AM Previous Signature: 10/12/2022 9:10:48 AM Version By: Gretta Cool, BSN, RN, CWS, Kim RN, BSN Entered By: Gretta Cool, BSN, RN, CWS, Kim on 10/12/2022 09:13:56 -------------------------------------------------------------------------------- Lower Extremity Assessment Details Patient Name: Date of Service: David [name], [name] 10/12/2022 8:15 David M Medical Record Number: [id number] Patient Account Number: [id number] Date of Birth/Sex: Treating RN: [birth year] (82 y.o. David Jennings Primary Care David Jennings: [name] Other Clinician: Referring Daleisa Halperin: Treating David Jennings/Extender: [name] in Treatment: 1 Edema Assessment Assessed: [Left: No] [Right: No] [Left: Edema] [Right: :] Calf Left: Right: Point of Measurement: 34 cm From Medial Instep 42 cm Ankle Left: Right: Point of Measurement: 12 cm From Medial Instep 29.5 cm Vascular Assessment Pulses: Dorsalis Pedis Palpable: [Right:Yes] Electronic Signature(s) Signed: 10/12/2022 11:19:07 AM By: Gretta Cool, BSN, RN, CWS, Kim RN, BSN Entered By: Gretta Cool, BSN, RN, CWS, Kim on 10/12/2022 62:83:15 -------------------------------------------------------------------------------- Multi Wound Chart Details Patient Name: Date of Service: David Johns, [name] 10/12/2022 8:15 David M Medical Record Number: [id number] Patient Account Number: [id number] Date of Birth/Sex: Treating RN: [birth year] (81 y.o. David Jennings Primary Care David Jennings: [name] Other Clinician: Referring Aino Heckert: Treating David Jennings/Extender: [name] in Treatment: 1 Vital Signs Height(in): 69 Pulse(bpm): [name]  ([id number]) [id number] Page 3 of 8 Weight(lbs): 248 Blood Pressure(mmHg): 151/76 Body Mass Index(BMI): 36.6 Temperature(F): 97.6 Respiratory Rate(breaths/min): 16 [1:Photos:] [N/David:N/David] Right, Anterior Lower Leg N/David N/David Wound Location: Skin T ear/Laceration N/David N/David Wounding Event: Venous Leg Ulcer N/David N/David Primary Etiology: Asthma, Coronary Artery Disease, N/David N/David Comorbid History: Hypertension 09/28/2022 N/David N/David Date Acquired: 1 N/David N/David Weeks of Treatment: Open N/David N/David Wound Status: No N/David N/David Wound Recurrence: 3.2x2.8x0.2 N/David N/David Measurements L x W x D (cm) 7.037  N/David N/David David (cm) : rea 1.407 N/David N/David Volume (cm) : 36.00% N/David N/David % Reduction in Area: 36.00% N/David N/David % Reduction in Volume: Full Thickness Without Exposed N/David N/David Classification: Support Structures Medium N/David N/David Exudate Amount: Serosanguineous N/David N/David Exudate Type: red, brown N/David N/David Exudate Color: Flat and Intact N/David N/David Wound Margin: None Present (0%) N/David N/David Granulation Amount: Large (67-100%) N/David N/David Necrotic Amount: Fat Layer (Subcutaneous Tissue): Yes N/David N/David Exposed Structures: Fascia: No Tendon: No Muscle: No Joint: No Bone: No None N/David N/David Epithelialization: Patient nicked leg with fingernail. Small N/David N/David Assessment Notes: laceration improving. Treatment Notes Notes Patient has small laceration from his fingernail on the medial aspect of the wound. Photo above. Electronic Signature(s) Signed: 10/12/2022 11:19:07 AM By: Gretta Cool, BSN, RN, CWS, Kim RN, BSN Entered By: Gretta Cool, BSN, RN, CWS, Kim on 10/12/2022 08:41:17 David Jennings ([id number]) [id number] Page 4 of 8 -------------------------------------------------------------------------------- Multi-Disciplinary Care Plan Details Patient Name: Date of Service: David [name], [name] 10/12/2022 8:15 David M Medical Record Number: [id number] Patient Account Number:  [id number] Date of Birth/Sex: Treating RN: [birth year] (82 y.o. David Jennings, David Jennings Primary Care David Jennings: [name] Other Clinician: Referring David Jennings: Treating David Jennings/Extender: [name] in Treatment: 1 Active Inactive Necrotic Tissue Nursing Diagnoses: Impaired tissue integrity related to necrotic/devitalized tissue Knowledge deficit related to management of necrotic/devitalized tissue Goals: Necrotic/devitalized tissue will be minimized in the wound bed Date Initiated: 10/12/2022 Target Resolution Date: 10/12/2022 Goal Status: Active Patient/caregiver will verbalize understanding of reason and process for debridement of necrotic tissue Date Initiated: 10/12/2022 Target Resolution Date: 10/12/2022 Goal Status: Active Interventions: Assess patient pain level pre-, during and post procedure and prior to discharge Provide education on necrotic tissue and debridement process Treatment Activities: Apply topical anesthetic as ordered : 10/12/2022 Excisional debridement : 10/12/2022 Notes: Orientation to the Wound Care Program Nursing Diagnoses: Knowledge deficit related to the wound healing center program Goals: Patient/caregiver will verbalize understanding of the [place] Date Initiated: 10/12/2022 Target Resolution Date: 10/12/2022 Goal Status: Active Interventions: Provide education on orientation to the wound center Notes: Venous Leg Ulcer Nursing Diagnoses: Potential for venous Insuffiency (use before diagnosis confirmed) Goals: Patient will maintain optimal edema control Date Initiated: 10/12/2022 Target Resolution Date: 10/12/2022 Goal Status: Active Patient/caregiver will verbalize understanding of disease process and disease management Date Initiated: 10/12/2022 Target Resolution Date: 10/12/2022 Goal Status: Active Verify adequate tissue perfusion prior to therapeutic compression application David, Jennings ([id number])  [id number] Page 5 of 8 Date Initiated: 10/12/2022 Target Resolution Date: 10/05/2022 Goal Status: Active Interventions: Assess peripheral edema status every visit. Compression as ordered Provide education on venous insufficiency Treatment Activities: Therapeutic compression applied : 10/12/2022 Notes: Wound/Skin Impairment Nursing Diagnoses: Impaired tissue integrity Knowledge deficit related to ulceration/compromised skin integrity Goals: Patient/caregiver will verbalize understanding of skin care regimen Date Initiated: 10/12/2022 Target Resolution Date: 10/05/2022 Goal Status: Active Ulcer/skin breakdown will have David volume reduction of 30% by week 4 Date Initiated: 10/12/2022 Target Resolution Date: 10/26/2022 Goal Status: Active Interventions: Assess patient/caregiver ability to perform ulcer/skin care regimen upon admission and as needed Provide education on ulcer and skin care Notes: Electronic Signature(s) Signed: 10/12/2022 9:09:12 AM By: Gretta Cool, BSN, RN, CWS, Kim RN, BSN Entered By: Gretta Cool, BSN, RN, CWS, Kim on 10/12/2022 09:09:11 -------------------------------------------------------------------------------- Pain Assessment Details Patient Name: Date of Service: David Johns, [name] 10/12/2022 8:15 David M Medical Record Number: [id number] Patient Account Number: [id number] Date of Birth/Sex: Treating RN:  1941/07/23 (82 y.o. David Jennings Primary Care Corneshia Hines: [name] Other Clinician: Referring Grady Mohabir: Treating Shaquile Lutze/Extender: [name] in Treatment: 1 Active Problems Location of Pain Severity and Description of Pain Patient Has Paino No Site Locations David, Jennings ([id number]) [id number] Page 6 of 8 Pain Management and Medication Current Pain Management: Notes Patient denies pain at this time. Electronic Signature(s) Signed: 10/12/2022 11:19:07 AM By: Gretta Cool, BSN, RN, CWS,  Kim RN, BSN Entered By: Gretta Cool, BSN, RN, CWS, Kim on 10/12/2022 08:07:31 -------------------------------------------------------------------------------- Patient/Caregiver Education Details Patient Name: Date of Service: David Johns, [name] 1/12/2024andnbsp8:15 David M Medical Record Number: [id number] Patient Account Number: [id number] Date of Birth/Gender: Treating RN: 02/05/1941 (82 y.o. David Jennings Primary Care Physician: [name] Other Clinician: Referring Physician: Treating Physician/Extender: [name] in Treatment: 1 Education Assessment Education Provided To: Patient Education Topics Provided Venous: Handouts: Controlling Swelling with Multilayered Compression Wraps, Other: Do not get wet, evevate if tight, if slides more that inch call for re-wrap Methods: Demonstration, Explain/Verbal Responses: State content correctly Electronic Signature(s) Signed: 10/12/2022 11:19:07 AM By: Gretta Cool, BSN, RN, CWS, Kim RN, BSN Entered By: Gretta Cool, BSN, RN, CWS, Kim on 10/12/2022 09:07:14 David Jennings ([id number]) [id number] Page 7 of 8 -------------------------------------------------------------------------------- Wound Assessment Details Patient Name: Date of Service: David [name], [name] 10/12/2022 8:15 David M Medical Record Number: [id number] Patient Account Number: [id number] Date of Birth/Sex: Treating RN: June 15, 1941 (82 y.o. David Jennings, David Jennings Primary Care Franchot Pollitt: [name] Other Clinician: Referring Marchetta Navratil: Treating Fawaz Borquez/Extender: [name] in Treatment: 1 Wound Status Wound Number: 1 Primary Etiology: Venous Leg Ulcer Wound Location: Right, Anterior Lower Leg Wound Status: Open Wounding Event: Skin Tear/Laceration Comorbid History: Asthma, Coronary Artery Disease, Hypertension Date Acquired: 09/28/2022 Weeks Of Treatment: 1 Clustered Wound: No Photos Wound Measurements Length:  (cm) Width: (cm) Depth: (cm) Area: (cm) Volume: (cm) 3.2 % Reduction in Area: 36% 2.8 % Reduction in Volume: 36% 0.2 Epithelialization: None 7.037 Tunneling: No 1.407 Undermining: No Wound Description Classification: Full Thickness Without Exposed Suppo Wound Margin: Flat and Intact Exudate Amount: Medium Exudate Type: Serosanguineous Exudate Color: red, brown rt Structures Foul Odor After Cleansing: No Slough/Fibrino Yes Wound Bed Granulation Amount: None Present (0%) Exposed Structure Necrotic Amount: Large (67-100%) Fascia Exposed: No Necrotic Quality: Adherent Slough Fat Layer (Subcutaneous Tissue) Exposed: Yes Tendon Exposed: No Muscle Exposed: No Joint Exposed: No Bone Exposed: No Assessment Notes Patient nicked leg with fingernail. Small laceration improving. Treatment Notes Wound #1 (Lower Leg) Wound Laterality: Right, Anterior Cleanser David, Jennings ([id number]) [id number] Page 8 of 8 Peri-Wound Care Topical Primary Dressing IODOFLEX 0.9% Cadexomer Iodine Pad Discharge Instruction: Apply Iodoflex to wound bed only as directed. Secondary Dressing Zetuvit Plus 4x4 (in/in) Secured With Compression Wrap 3-LAYER WRAP - Profore Lite LF 3 Multilayer Compression Bandaging System Discharge Instruction: Apply 3 multi-layer wrap as prescribed. Compression Stockings Environmental education officer) Signed: 10/12/2022 11:19:07 AM By: Gretta Cool, BSN, RN, CWS, Kim RN, BSN Entered By: Gretta Cool, BSN, RN, CWS, Kim on 10/12/2022 08:14:00 -------------------------------------------------------------------------------- Vitals Details Patient Name: Date of Service: David [name], [name] 10/12/2022 8:15 David M Medical Record Number: [id number] Patient Account Number: [id number] Date of Birth/Sex: Treating RN: Oct 17, 1940 (81 y.o. David Jennings Primary Care Petrice Beedy: [name] Other Clinician: Referring Jaeceon Michelin: Treating Deaysia Grigoryan/Extender: [name] in Treatment: 1 Vital Signs Time Taken: 08:05 Temperature (F): 97.6 Height (in): 69 Pulse (bpm): 64 Weight (lbs): 248  Respiratory Rate (breaths/min): 16 Body Mass Index (BMI): 36.6 Blood Pressure (mmHg): 151/76 Reference Range: 80 - 120 mg / dl Electronic Signature(s) Signed: 10/12/2022 11:19:07 AM By: Gretta Cool, BSN, RN, CWS, Kim RN, BSN Entered By: Gretta Cool, BSN, RN, CWS, Kim on 10/12/2022 08:07:18

## 2022-10-18 ENCOUNTER — Ambulatory Visit: Payer: Medicare PPO | Admitting: Physician Assistant

## 2022-10-19 ENCOUNTER — Encounter: Payer: Medicare PPO | Admitting: Physician Assistant

## 2022-10-19 DIAGNOSIS — L97812 Non-pressure chronic ulcer of other part of right lower leg with fat layer exposed: Secondary | ICD-10-CM | POA: Diagnosis not present

## 2022-10-19 NOTE — Progress Notes (Addendum)
David Jennings, David Jennings ([id number]) [id number] Page 1 of 6 Visit Report for 10/19/2022 Chief Complaint Document Details Patient Name: Date of Service: David [name], [name] 10/19/2022 8:00 David M Medical Record Number: [id number] Patient Account Number: [id number] Date of Birth/Sex: Treating RN: 09-09-1941 (82 y.o. David Jennings) David Jennings Primary Care Provider: [name] Other Clinician: Referring Provider: Treating Provider/Extender: David Jennings in Treatment: 2 Information Obtained from: Patient Chief Complaint Right LE Ulcer Electronic Signature(s) Signed: 10/19/2022 8:14:05 AM By: [name] Entered By: Worthy Keeler on 10/19/2022 08:14:04 -------------------------------------------------------------------------------- HPI Details Patient Name: Date of Service: David David Bussing, [name] 10/19/2022 8:00 David M Medical Record Number: [id number] Patient Account Number: [id number] Date of Birth/Sex: Treating RN: [birth year] (82 y.o. David Jennings Primary Care Provider: [name] Other Clinician: Referring Provider: Treating Provider/Extender: David Jennings in Treatment: 2 History of Present Illness HPI Description: 10-04-2022 upon evaluation today patient presents for David wound of his right anterior lower leg which is just below the knee and began on 12-29- 2023. This was initially David strike to the leg which subsequently ended up with what appears to be David hematoma and the patient ended up of course with the open wound that we currently see. He does have long-term use of anticoagulant therapy he is specifically on I believe the Eliquis. He also has atrial fibrillation which is why he is on the blood thinners and likely what led to the wound currently. With that being said he is not currently on any antibiotics. Patient does have David history of cerebrovascular disease, atrial fibrillation, coronary artery disease, and  hypertension. 10-12-2022 upon evaluation today patient appears to be doing well currently in regard to his wound on the leg. He is showing signs of good improvement. Fortunately there does not appear to be any signs of infection he again is on David blood thinner therefore we are using Xeroform gauze to loosen up David lot of necrotic tissue here and that seems to have done David good job. He does have some sharp debridement necessary today and I think we can do that without causing any significant bleeding which is good news the more that we get off the better and I think we can keep this moving in the right direction. 10-19-2022 upon evaluation today patient actually is showing signs of improvement this is good news. I do think the wound is measuring smaller but it does seem to be trapping David lot of fluid underneath. For that reason I would like to see about going ahead and switch him to David Hydrofera Blue dressing to see if this could be beneficial is also very friable and David little bit hyper granulated due to the excessive moisture. I think that the silver cell may be trapping some fluid here. David Jennings ([id number]) [id number] Page 2 of 6 Electronic Signature(s) Signed: 10/19/2022 8:26:26 AM By: [name] Entered By: Worthy Keeler on 10/19/2022 08:26:26 -------------------------------------------------------------------------------- Physical Exam Details Patient Name: Date of Service: David [name], [name] 10/19/2022 8:00 David M Medical Record Number: [id number] Patient Account Number: [id number] Date of Birth/Sex: Treating RN: Apr 24, 1941 (82 y.o. David Jennings Primary Care Provider: [name] Other Clinician: Referring Provider: Treating Provider/Extender: [name] in Treatment: 2 Constitutional Well-nourished and well-hydrated in no acute distress. Respiratory normal breathing without difficulty. Psychiatric this patient  is able to make decisions and demonstrates good insight into disease process. Alert  and Oriented x 3. pleasant and cooperative. Notes Upon inspection patient's wound bed actually showed signs of good granulation epithelization at this point. Fortunately I do not see any evidence of active infection locally nor systemically which is great news and overall I am extremely pleased with where we stand I do believe that he is tolerating the dressing changes without complication. The wrap is doing great and overall I think would make the switch to [facility] the wound should do even better. Electronic Signature(s) Signed: 10/19/2022 8:26:58 AM By: [name] Entered By: Worthy Keeler on 10/19/2022 08:26:58 -------------------------------------------------------------------------------- Physician Orders Details Patient Name: Date of Service: David [name], [name] 10/19/2022 8:00 David M Medical Record Number: [id number] Patient Account Number: [id number] Date of Birth/Sex: Treating RN: April 14, 1941 (82 y.o. David Jennings) David Jennings Primary Care Provider: [name] Other Clinician: Referring Provider: Treating Provider/Extender: David Jennings in Treatment: 2 Verbal / Phone Orders: No Diagnosis Coding ICD-10 Coding Code Description I87.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 of right lower extremity David Jennings ([id number]) [id number] Page 3 of 6 [phone] Non-pressure chronic ulcer of other part of right lower leg with fat layer exposed I10 Essential (primary) hypertension I25.10 Atherosclerotic heart disease of native coronary artery without angina pectoris I67.89 Other cerebrovascular disease Z79.01 Long term (current) use of anticoagulants I48.0 Paroxysmal atrial fibrillation Follow-up Appointments ppointment in 1 week. - please fax all orders to janci at [facility] at [phone] Return David Bathing/  Shower/ Hygiene May shower with wound dressing protected with water repellent cover or cast protector. - Cast protector from [employer] or local pharmacy. Anesthetic (Use 'Patient Medications' Section for Anesthetic Order Entry) Lidocaine applied to wound bed Edema Control - Lymphedema / Segmental Compressive Device / Other Elevate, Exercise Daily and David void Standing for Long Periods of Time. Elevate legs to the level of the heart and pump ankles as often as possible Elevate leg(s) parallel to the floor when sitting. Wound Treatment Wound #1 - Lower Leg Wound Laterality: Right, Anterior Prim Dressing: Hydrofera Blue Ready Transfer Foam, 4x5 (in/in) 1 x Per Day/30 Days ary Discharge Instructions: Apply Hydrofera Blue Ready to wound bed as directed Secondary Dressing: Zetuvit Plus 4x4 (in/in) 1 x Per Day/30 Days Compression Wrap: 3-LAYER WRAP - Profore Lite LF 3 Multilayer Compression Bandaging System 1 x Per Day/30 Days Discharge Instructions: Apply 3 multi-layer wrap as prescribed. Electronic Signature(s) Signed: 10/19/2022 8:31:11 AM By: [name] Signed: 10/19/2022 12:55:59 PM By: David Coria RN Entered By: David Jennings on 10/19/2022 08:20:18 -------------------------------------------------------------------------------- Problem List Details Patient Name: Date of Service: [name], [name] 10/19/2022 8:00 David M Medical Record Number: [id number] Patient Account Number: [id number] Date of Birth/Sex: Treating RN: Sep 28, 1941 (81 y.o. David Jennings) David Jennings Primary Care Provider: [name] Other Clinician: Referring Provider: Treating Provider/Extender: David Jennings in Treatment: 2 Active Problems ICD-10 Encounter Code Description Active Date MDM Diagnosis I87.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 of right 10/04/2022 No Yes lower extremity L97.812 Non-pressure chronic ulcer of other part of right lower leg with fat layer  10/04/2022 No Yes exposed LYNFORD, ESPINOZA ([id number]) [id number] Page 4 of 6 I10 Essential (primary) hypertension 10/04/2022 No Yes I25.10 Atherosclerotic heart disease of native coronary artery without angina pectoris 10/04/2022 No Yes I67.89 Other cerebrovascular disease 10/04/2022 No Yes Z79.01 Long term (current) use of anticoagulants 10/04/2022 No Yes I48.0 Paroxysmal atrial fibrillation 10/04/2022 No Yes Inactive Problems  Resolved Problems Electronic Signature(s) Signed: 10/19/2022 8:14:01 AM By: [name] Entered By: Worthy Keeler on 10/19/2022 08:14:01 -------------------------------------------------------------------------------- Progress Note Details Patient Name: Date of Service: David [name], [name] 10/19/2022 8:00 David M Medical Record Number: [id number] Patient Account Number: [id number] Date of Birth/Sex: Treating RN: 1941-06-17 (81 y.o. David Jennings) David Jennings Primary Care Provider: [name] Other Clinician: Referring Provider: Treating Provider/Extender: David Jennings in Treatment: 2 Subjective Chief Complaint Information obtained from Patient Right LE Ulcer History of Present Illness (HPI) 10-04-2022 upon evaluation today patient presents for David wound of his right anterior lower leg which is just below the knee and began on 09-28-2022. This was initially David strike to the leg which subsequently ended up with what appears to be David hematoma and the patient ended up of course with the open wound that we currently see. He does have long-term use of anticoagulant therapy he is specifically on I believe the Eliquis. He also has atrial fibrillation which is why he is on the blood thinners and likely what led to the wound currently. With that being said he is not currently on any antibiotics. Patient does have David history of cerebrovascular disease, atrial fibrillation, coronary artery disease, and hypertension. 10-12-2022 upon  evaluation today patient appears to be doing well currently in regard to his wound on the leg. He is showing signs of good improvement. Fortunately there does not appear to be any signs of infection he again is on David blood thinner therefore we are using Xeroform gauze to loosen up David lot of necrotic tissue here and that seems to have done David good job. He does have some sharp debridement necessary today and I think we can do that without causing any significant bleeding which is good news the more that we get off the better and I think we can keep this moving in the right direction. 10-19-2022 upon evaluation today patient actually is showing signs of improvement this is good news. I do think the wound is measuring smaller but it does seem to be trapping David lot of fluid underneath. For that reason I would like to see about going ahead and switch him to David Hydrofera Blue dressing to see if this could be beneficial is also very friable and David little bit hyper granulated due to the excessive moisture. I think that the silver cell may be trapping some fluid here. HAO, DION ([id number]) [id number] Page 5 of 6 Objective Constitutional Well-nourished and well-hydrated in no acute distress. Vitals Time Taken: 8:00 AM, Height: 69 in, Weight: 248 lbs, BMI: 36.6, Temperature: 97.9 F, Pulse: 57 bpm, Respiratory Rate: 18 breaths/min, Blood Pressure: 116/77 mmHg. Respiratory normal breathing without difficulty. Psychiatric this patient is able to make decisions and demonstrates good insight into disease process. Alert and Oriented x 3. pleasant and cooperative. General Notes: Upon inspection patient's wound bed actually showed signs of good granulation epithelization at this point. Fortunately I do not see any evidence of active infection locally nor systemically which is great news and overall I am extremely pleased with where we stand I do believe that he is tolerating the  dressing changes without complication. The wrap is doing great and overall I think would make the switch to [facility] the wound should do even better. Integumentary (Hair, Skin) Wound #1 status is Open. Original cause of wound was Skin T ear/Laceration. The date acquired was: 09/28/2022. The wound has been in treatment 2 weeks. The wound  is located on the Right,Anterior Lower Leg. The wound measures 2.7cm length x 2.5cm width x 0.2cm depth; 5.301cm^2 area and 1.06cm^3 volume. There is Fat Layer (Subcutaneous Tissue) exposed. There is no tunneling or undermining noted. There is David medium amount of serosanguineous drainage noted. The wound margin is flat and intact. There is no granulation within the wound bed. There is David large (67-100%) amount of necrotic tissue within the wound bed including Adherent Slough. 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 - please fax all orders to janci at [facility] at [phone] Bathing/ Shower/ Hygiene: May shower with wound dressing protected with water repellent cover or cast protector. - Cast protector from [employer] or local pharmacy. Anesthetic (Use 'Patient Medications' Section for Anesthetic Order Entry): Lidocaine applied to wound bed Edema Control - Lymphedema / Segmental Compressive Device / Other: Elevate, Exercise Daily and Avoid Standing for Long Periods of Time. Elevate legs to the level of the heart and pump ankles as often as possible Elevate leg(s) parallel to the floor when sitting. WOUND #1: - Lower  Leg Wound Laterality: Right, Anterior Prim Dressing: Hydrofera Blue Ready Transfer Foam, 4x5 (in/in) 1 x Per Day/30 Days ary Discharge Instructions: Apply Hydrofera Blue Ready to wound bed as directed Secondary Dressing: Zetuvit Plus 4x4 (in/in) 1 x Per Day/30 Days Com pression Wrap: 3-LAYER WRAP - Profore Lite LF 3 Multilayer Compression Bandaging System 1 x Per Day/30 Days Discharge Instructions: Apply 3 multi-layer wrap as prescribed. 1. I am going to recommend currently that we have the patient continue to monitor for any signs of infection or worsening. Obviously if anything changes he knows contact the office and let me know. 2. I am good recommend as well that the patient should continue to elevate his legs much as possible and switch to [facility] we are going to Stick with the 3 layer compression wrap which seems to be doing excellent. I do think he could be David candidate for skin substitute in the future but he is making such good progress right now I do not believe that is necessary. We will see patient back for reevaluation in 1 week here in the clinic. If anything worsens or changes patient will contact our office for additional recommendations. NAYEF, COLLEGE ([id number]) [id number] Page 6 of 6 Electronic Signature(s) Signed: 10/19/2022 8:27:46 AM By: [name] Entered By: Worthy Keeler on 10/19/2022 08:27:46 -------------------------------------------------------------------------------- SuperBill Details Patient Name: Date of Service: David David Bussing, [name] 10/19/2022 Medical Record Number: [id number] Patient Account Number: [id number] Date of Birth/Sex: Treating RN: 1941-05-24 (81 y.o. David Jennings) David Jennings Primary Care Provider: [name] Other Clinician: Referring Provider: Treating Provider/Extender: David Jennings in Treatment: 2 Diagnosis Coding ICD-10 Codes Code Description [phone] Chronic venous  hypertension (idiopathic) with ulcer and inflammation of right lower extremity L97.812 Non-pressure chronic ulcer of other part of right lower leg with fat layer exposed I10 Essential (primary) hypertension I25.10 Atherosclerotic heart disease of native coronary artery without angina pectoris I67.89 Other cerebrovascular disease Z79.01 Long term (current) use of anticoagulants I48.0 Paroxysmal atrial fibrillation Physician Procedures : CPT4 Code Description Modifier [id number] - WC PHYS LEVEL  3 - EST PT ICD-10 Diagnosis Description I87.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 of right lower extremity L97.812 Non-pressure chronic ulcer of other part  of right lower leg with fat layer exposed I10 Essential (primary) hypertension I25.10 Atherosclerotic heart disease of native coronary artery without angina pectoris Quantity: 1 Electronic Signature(s) Signed: 10/19/2022 8:29:51 AM By: [name] Entered By: Worthy Keeler on 10/19/2022 08:29:51

## 2022-10-19 NOTE — Progress Notes (Signed)
DEAGO, BURRUSS ([id number]) [id number] Page 1 of 9 Visit Report for 10/19/2022 Arrival Information Details Patient Name: Date of Service: [name], [name] 10/19/2022 8:00 A M Medical Record Number: [id number] Patient Account Number: [id number] Date of Birth/Sex: Treating RN: 02/15/1941 (82 y.o. David Jennings) [name] Primary Care Sung [place]: [name] Other Clinician: Massie Kluver Referring Emie Sommerfeld: Treating Lafern Brinkley/Extender: [name] in Treatment: 2 Visit Information History Since Last Visit All ordered tests and consults were completed: No Patient Arrived: Ambulatory Added or deleted any medications: No Arrival Time: 07:56 Any new allergies or adverse reactions: No Transfer Assistance: None Had a fall or experienced change in No Patient Identification Verified: Yes activities of daily living that may affect Secondary Verification Process Completed: Yes risk of falls: Patient Requires Transmission-Based Precautions: No Signs or symptoms of abuse/neglect since last visito No Patient Has Alerts: Yes Hospitalized since last visit: No Patient Alerts: Patient on Blood Thinner Implantable device outside of the clinic excluding No cellular tissue based products placed in the center since last visit: Has Dressing in Place as Prescribed: Yes Has Compression in Place as Prescribed: Yes Pain Present Now: No Electronic Signature(s) Signed: 10/19/2022 9:50:53 AM By: Massie Kluver Entered By: Massie Kluver on 10/19/2022 07:59:43 -------------------------------------------------------------------------------- Clinic Level of Care Assessment Details Patient Name: Date of Service: [name], [name] 10/19/2022 8:00 A M Medical Record Number: [id number] Patient Account Number: [id number] Date of Birth/Sex: Treating RN: 12-21-1940 (82 y.o. David Jennings) [name] Primary Care Haze Antillon: [name] Other Clinician: Referring  Monquie Fulgham: Treating Moe Brier/Extender: [name] in Treatment: 2 Clinic Level of Care Assessment Items TOOL 1 Quantity Score '[]'$  - 0 Use when EandM and Procedure is performed on INITIAL visit ASSESSMENTS - Nursing Assessment / Reassessment '[]'$  - 0 General Physical Exam (combine w/ comprehensive assessment (listed just below) when performed on new pt. [address] [place], STUDNICKA ([id number]) [id number] Page 2 of 9 '[]'$  - 0 Comprehensive Assessment (HX, ROS, Risk Assessments, Wounds Hx, etc.) ASSESSMENTS - Wound and Skin Assessment / Reassessment '[]'$  - 0 Dermatologic / Skin Assessment (not related to wound area) ASSESSMENTS - Ostomy and/or Continence Assessment and Care '[]'$  - 0 Incontinence Assessment and Management '[]'$  - 0 Ostomy Care Assessment and Management (repouching, etc.) PROCESS - Coordination of Care '[]'$  - 0 Simple Patient / Family Education for ongoing care '[]'$  - 0 Complex (extensive) Patient / Family Education for ongoing care '[]'$  - 0 Staff obtains Programmer, systems, Records, T Results / Process Orders est '[]'$  - 0 Staff telephones HHA, Nursing Homes / Clarify orders / etc '[]'$  - 0 Routine Transfer to another Facility (non-emergent condition) '[]'$  - 0 Routine Hospital Admission (non-emergent condition) '[]'$  - 0 New Admissions / Biomedical engineer / Ordering NPWT Apligraf, etc. , '[]'$  - 0 Emergency Hospital Admission (emergent condition) PROCESS - Special Needs '[]'$  - 0 Pediatric / Minor Patient Management '[]'$  - 0 Isolation Patient Management '[]'$  - 0 Hearing / Language / Visual special needs '[]'$  - 0 Assessment of Community assistance (transportation, D/C planning, etc.) '[]'$  - 0 Additional assistance / Altered mentation '[]'$  - 0 Support Surface(s) Assessment (bed, cushion, seat, etc.) INTERVENTIONS - Miscellaneous '[]'$  - 0 External ear exam '[]'$  - 0 Patient Transfer (multiple staff / Civil Service fast streamer / Similar devices) '[]'$  - 0 Simple Staple /  Suture removal (25 or less) '[]'$  - 0 Complex Staple / Suture removal (26 or more) '[]'$  - 0 Hypo/Hyperglycemic Management (do not check if billed separately) '[]'$  -  0 Ankle / Brachial Index (ABI) - do not check if billed separately Has the patient been seen at the hospital within the last three years: Yes Total Score: 0 Level Of Care: ____ Electronic Signature(s) Signed: 10/19/2022 12:55:59 PM By: [name] RN Entered By: [name] on 10/19/2022 08:30:52 -------------------------------------------------------------------------------- Compression Therapy Details Patient Name: Date of Service: David Johns, [name] 10/19/2022 8:00 A M Medical Record Number: [id number] Patient Account Number: [id number] Date of Birth/Sex: Treating RN: 1940-11-08 (82 y.o. David Jennings Primary Care Addelynn Batte: [name] Other Clinician: WENDLE, David Jennings ([id number]) [id number] Page 3 of 9 Referring Bronsen Serano: Treating Fedrick Cefalu/Extender: [name] in Treatment: 2 Compression Therapy Performed for Wound Assessment: Wound #1 Right,Anterior Lower Leg Performed By: Clinician [name], RN Compression Type: Three Layer Post Procedure Diagnosis Same as Pre-procedure Electronic Signature(s) Signed: 10/19/2022 8:30:41 AM By: [name] RN Entered By: [name] on 10/19/2022 08:30:40 -------------------------------------------------------------------------------- Encounter Discharge Information Details Patient Name: Date of Service: David Johns, [name] 10/19/2022 8:00 A M Medical Record Number: [id number] Patient Account Number: [id number] Date of Birth/Sex: Treating RN: 12/10/1940 (82 y.o. David Jennings) [name] Primary Care Ryleigh Esqueda: [name] Other Clinician: Referring Allecia Bells: Treating Karyssa Amaral/Extender: [name] in Treatment: 2 Encounter Discharge Information Items Discharge Condition: Stable Ambulatory Status:  Ambulatory Discharge Destination: Home Transportation: Private Auto Accompanied By: self Schedule Follow-up Appointment: Yes Clinical Summary of Care: Electronic Signature(s) Signed: 10/19/2022 8:31:20 AM By: [name] RN Entered By: [name] on 10/19/2022 08:31:20 -------------------------------------------------------------------------------- Lower Extremity Assessment Details Patient Name: Date of Service: [name], [name] 10/19/2022 8:00 A M Medical Record Number: [id number] Patient Account Number: [id number] Date of Birth/Sex: Treating RN: 01-14-1941 (81 y.o. David Jennings) [name] Primary Care Arrin Pintor: [name] Other Clinician: Referring Melville Engen: Treating Lochlyn Zullo/Extender: [name] in Treatment: 2 Edema Assessment Left: [Left: Right] [Right: :] Assessed: [Left: No] [Right: No] Edema: [Left: Ye] [Right: s] Calf Left: Right: Point of Measurement: 34 cm From Medial Instep 40 cm Ankle Left: Right: Point of Measurement: 12 cm From Medial Instep 28 cm Vascular Assessment Pulses: Dorsalis Pedis Palpable: [Right:Yes] Electronic Signature(s) Signed: 10/19/2022 12:55:59 PM By: [name] RN Entered By: [name] on 10/19/2022 08:09:13 -------------------------------------------------------------------------------- Multi Wound Chart Details Patient Name: Date of Service: David Johns, [name] 10/19/2022 8:00 A M Medical Record Number: [id number] Patient Account Number: [id number] Date of Birth/Sex: Treating RN: Aug 16, 1941 (81 y.o. David Jennings) [name] Primary Care Jasman Pfeifle: [name] Other Clinician: Referring Shiree Altemus: Treating Deloyce Walthers/Extender: [name] in Treatment: 2 Vital Signs Height(in): 69 Pulse(bpm): 57 Weight(lbs): 248 Blood Pressure(mmHg): 116/77 Body Mass Index(BMI): 36.6 Temperature(F): 97.9 Respiratory Rate(breaths/min): 18 [1:Photos:] [N/A:N/A] Right, Anterior Lower Leg N/A  N/A Wound Location: Skin Tear/Laceration N/A N/A Wounding Event: Venous Leg Ulcer N/A N/A Primary Etiology: Asthma, Coronary Artery Disease, N/A N/A Comorbid History: Hypertension 09/28/2022 N/A N/A Date Acquired: 2 N/A N/A Weeks of Treatment: Open N/A N/A Wound Status: No N/A N/A Wound Recurrence: 2.7x2.5x0.2 N/A N/A Measurements L x W x D (cm) 5.301 N/A N/A A (cm) : David Jennings, David Jennings ([id number]) [id number] Page 5 of 9 1.06 N/A N/A Volume (cm) : 51.80% N/A N/A % Reduction in Area: 51.80% N/A N/A % Reduction in Volume: Full Thickness Without Exposed N/A N/A Classification: Support Structures Medium N/A N/A Exudate Amount: Serosanguineous N/A N/A Exudate Type: red, brown N/A N/A Exudate Color: Flat and Intact N/A N/A Wound Margin: None  Present (0%) N/A N/A Granulation Amount: Large (67-100%) N/A N/A Necrotic Amount: Fat Layer (Subcutaneous Tissue): Yes N/A N/A Exposed Structures: Fascia: No Tendon: No Muscle: No Joint: No Bone: No None N/A N/A Epithelialization: Treatment Notes Electronic Signature(s) Signed: 10/19/2022 12:55:59 PM By: [name] RN Entered By: [name] on 10/19/2022 08:09:19 -------------------------------------------------------------------------------- Multi-Disciplinary Care Plan Details Patient Name: Date of Service: David Johns, [name] 10/19/2022 8:00 A M Medical Record Number: [id number] Patient Account Number: [id number] Date of Birth/Sex: Treating RN: [birth year] (81 y.o. David Jennings) [name] Primary Care Estha Few: [name] Other Clinician: Referring Connery Shiffler: Treating Dunbar Buras/Extender: [name] in Treatment: 2 Active Inactive Venous Leg Ulcer Nursing Diagnoses: Potential for venous Insuffiency (use before diagnosis confirmed) Goals: Patient will maintain optimal edema control Date Initiated: 10/12/2022 Date Inactivated: 10/19/2022 Target Resolution  Date: 10/12/2022 Goal Status: Met Patient/caregiver will verbalize understanding of disease process and disease management Date Initiated: 10/12/2022 Target Resolution Date: 10/12/2022 Goal Status: Active Verify adequate tissue perfusion prior to therapeutic compression application Date Initiated: 10/12/2022 Target Resolution Date: 10/05/2022 Goal Status: Active Interventions: Assess peripheral edema status every visit. Compression as ordered Provide education on venous insufficiency Treatment Activities: Therapeutic compression applied : 10/12/2022 Notes: David Jennings, David Jennings ([id number]) [id number] Page 6 of 9 Wound/Skin Impairment Nursing Diagnoses: Impaired tissue integrity Knowledge deficit related to ulceration/compromised skin integrity Goals: Patient/caregiver will verbalize understanding of skin care regimen Date Initiated: 10/12/2022 Target Resolution Date: 10/05/2022 Goal Status: Active Ulcer/skin breakdown will have a volume reduction of 30% by week 4 Date Initiated: 10/12/2022 Target Resolution Date: 10/26/2022 Goal Status: Active Interventions: Assess patient/caregiver ability to perform ulcer/skin care regimen upon admission and as needed Provide education on ulcer and skin care Notes: Electronic Signature(s) Signed: 10/19/2022 12:55:59 PM By: [name] RN Entered By: [name] on 10/19/2022 08:10:06 -------------------------------------------------------------------------------- Pain Assessment Details Patient Name: Date of Service: David Johns, [name] 10/19/2022 8:00 A M Medical Record Number: [id number] Patient Account Number: [id number] Date of Birth/Sex: Treating RN: 1941/08/15 (81 y.o. David Jennings Primary Care Trichelle Lehan: [name] Other Clinician: Referring Kathlene Yano: Treating Mukund Weinreb/Extender: [name] in Treatment: 2 Active Problems Location of Pain Severity and Description of Pain Patient  Has Paino No Site Locations Pain Management and Medication Current Pain Management: TYLAR, AMBORN ([id number]) [id number] Page 7 of 9 Electronic Signature(s) Signed: 10/19/2022 9:50:53 AM By: Massie Kluver Signed: 10/19/2022 12:55:59 PM By: [name] RN Entered By: Massie Kluver on 10/19/2022 08:01:40 -------------------------------------------------------------------------------- Patient/Caregiver Education Details Patient Name: Date of Service: David Johns, [name] 1/19/2024andnbsp8:00 A M Medical Record Number: [id number] Patient Account Number: [id number] Date of Birth/Gender: Treating RN: 17-May-1941 (81 y.o. David Jennings) [name] Primary Care Physician: [name] Other Clinician: Referring Physician: Treating Physician/Extender: [name] in Treatment: 2 Education Assessment Education Provided To: Patient Education Topics Provided Wound/Skin Impairment: Methods: Explain/Verbal Responses: State content correctly Electronic Signature(s) Signed: 10/19/2022 12:55:59 PM By: [name] RN Entered By: [name] on 10/19/2022 08:09:42 -------------------------------------------------------------------------------- Wound Assessment Details Patient Name: Date of Service: David Johns, [name] 10/19/2022 8:00 A M Medical Record Number: [id number] Patient Account Number: [id number] Date of Birth/Sex: Treating RN: 1941-05-09 (81 y.o. David Jennings) [name] Primary Care Brileigh Sevcik: [name] Other Clinician: Referring Gareth Fitzner: Treating Dequarius Jeffries/Extender: [name] in Treatment: 2 Wound Status Wound Number: 1 Primary Etiology: Venous Leg Ulcer Wound Location: Right, Anterior Lower Leg Wound Status: Open Wounding Event: Skin Tear/Laceration Comorbid History: Asthma,  Coronary Artery Disease, Hypertension Date Acquired: 09/28/2022 Weeks Of Treatment: 2 Clustered Wound: No David Jennings, David Jennings  ([id number]) [id number] Page 8 of 9 Photos Wound Measurements Length: (cm) 2.7 Width: (cm) 2.5 Depth: (cm) 0.2 Area: (cm) 5.301 Volume: (cm) 1.06 % Reduction in Area: 51.8% % Reduction in Volume: 51.8% Epithelialization: None Tunneling: No Undermining: No Wound Description Classification: Full Thickness Without Exposed Support Wound Margin: Flat and Intact Exudate Amount: Medium Exudate Type: Serosanguineous Exudate Color: red, brown Structures Foul Odor After Cleansing: No Slough/Fibrino Yes Wound Bed Granulation Amount: None Present (0%) Exposed Structure Necrotic Amount: Large (67-100%) Fascia Exposed: No Necrotic Quality: Adherent Slough Fat Layer (Subcutaneous Tissue) Exposed: Yes Tendon Exposed: No Muscle Exposed: No Joint Exposed: No Bone Exposed: No Treatment Notes Wound #1 (Lower Leg) Wound Laterality: Right, Anterior Cleanser Peri-Wound Care Topical Primary Dressing Hydrofera Blue Ready Transfer Foam, 4x5 (in/in) Discharge Instruction: Apply Hydrofera Blue Ready to wound bed as directed Secondary Dressing Zetuvit Plus 4x4 (in/in) Secured With Compression Wrap 3-LAYER WRAP - Profore Lite LF 3 Multilayer Compression Bandaging System Discharge Instruction: Apply 3 multi-layer wrap as prescribed. Compression Stockings Add-Ons Electronic Signature(s) Signed: 10/19/2022 12:55:59 PM By: [name] RN Entered By: [name] on 10/19/2022 74:08:14 David Jennings ([id number]) [id number] Page 9 of 9 -------------------------------------------------------------------------------- Vitals Details Patient Name: Date of Service: [name], [name] 10/19/2022 8:00 A M Medical Record Number: [id number] Patient Account Number: [id number] Date of Birth/Sex: Treating RN: 1941/04/07 (81 y.o. David Jennings) [name] Primary Care Cyan Moultrie: [name] Other Clinician: Referring Dorion Petillo: Treating  Keagan Anthis/Extender: [name] in Treatment: 2 Vital Signs Time Taken: 08:00 Temperature (F): 97.9 Height (in): 69 Pulse (bpm): 57 Weight (lbs): 248 Respiratory Rate (breaths/min): 18 Body Mass Index (BMI): 36.6 Blood Pressure (mmHg): 116/77 Reference Range: 80 - 120 mg / dl Electronic Signature(s) Signed: 10/19/2022 9:50:53 AM By: Massie Kluver Entered By: Massie Kluver on 10/19/2022 08:01:36

## 2022-10-26 ENCOUNTER — Encounter: Payer: Medicare PPO | Admitting: Physician Assistant

## 2022-10-26 DIAGNOSIS — L97812 Non-pressure chronic ulcer of other part of right lower leg with fat layer exposed: Secondary | ICD-10-CM | POA: Diagnosis not present

## 2022-10-26 NOTE — Progress Notes (Signed)
[name], [name] ([id number]) [id number] Page 1 of 8 Visit Report for 10/26/2022 Arrival Information Details Patient Name: Date of Service: [name], [name] 10/26/2022 8:15 A M Medical Record Number: [id number] Patient Account Number: [id number] Date of Birth/Sex: Treating RN: Nov 26, 1940 (82 y.o. David Jennings Primary Care Annalise Mcdiarmid: [name] Other Clinician: Referring Rodrigus Kilker: Treating Geoffrey Mankin/Extender: [name] in Treatment: 3 Visit Information History Since Last Visit Added or deleted any medications: No Patient Arrived: Ambulatory Has Dressing in Place as Prescribed: Yes Arrival Time: 08:11 Has Compression in Place as Prescribed: Yes Transfer Assistance: None Pain Present Now: No Patient Identification Verified: Yes Secondary Verification Process Completed: Yes Patient Requires Transmission-Based Precautions: No Patient Has Alerts: Yes Patient Alerts: Patient on Blood Thinner Electronic Signature(s) Signed: 10/26/2022 12:04:39 PM By: Gretta Cool, BSN, RN, CWS, Kim RN, BSN Entered By: Gretta Cool, BSN, RN, CWS, Kim on 10/26/2022 08:14:50 -------------------------------------------------------------------------------- Compression Therapy Details Patient Name: Date of Service: [name], [name] 10/26/2022 8:15 A M Medical Record Number: [id number] Patient Account Number: [id number] Date of Birth/Sex: Treating RN: 1941/09/30 (82 y.o. David Jennings Primary Care Permelia Bamba: [name] Other Clinician: Referring Mccall Lomax: Treating Chandlar Guice/Extender: [name] in Treatment: 3 Compression Therapy Performed for Wound Assessment: Wound #1 Right,Anterior Lower Leg Performed By: Clinician Cornell Barman, RN Compression Type: Three Layer Pre Treatment ABI: 1.1 Post Procedure Diagnosis Same as Pre-procedure Electronic Signature(s) Signed: 10/26/2022 12:04:39 PM By: Gretta Cool, BSN, RN, CWS, Kim RN, BSN Entered  By: Gretta Cool, BSN, RN, CWS, Kim on 10/26/2022 09:13:42 David Jennings ([id number]) [id number] Page 2 of 8 -------------------------------------------------------------------------------- Encounter Discharge Information Details Patient Name: Date of Service: [name], [name] 10/26/2022 8:15 A M Medical Record Number: [id number] Patient Account Number: [id number] Date of Birth/Sex: Treating RN: [birth year] (82 y.o. David Jennings Primary Care Ellie Bryand: [name] Other Clinician: Referring Amberia Bayless: Treating Reverie Vaquera/Extender: [name] in Treatment: 3 Encounter Discharge Information Items Discharge Condition: Stable Ambulatory Status: Ambulatory Discharge Destination: Home Transportation: Private Auto Schedule Follow-up Appointment: Yes Clinical Summary of Care: Electronic Signature(s) Signed: 10/26/2022 12:04:39 PM By: Gretta Cool, BSN, RN, CWS, Kim RN, BSN Entered By: Gretta Cool, BSN, RN, CWS, Kim on 10/26/2022 09:16:31 -------------------------------------------------------------------------------- Lower Extremity Assessment Details Patient Name: Date of Service: [name], [name] 10/26/2022 8:15 A M Medical Record Number: [id number] Patient Account Number: [id number] Date of Birth/Sex: Treating RN: August 11, 1941 (82 y.o. David Jennings Primary Care Curry Seefeldt: [name] Other Clinician: Referring Delania Ferg: Treating [name] Cockerill/Extender: [name] in Treatment: 3 Edema Assessment Assessed: [Left: No] [Right: No] [Left: Edema] [Right: :] Calf Left: Right: Point of Measurement: 34 cm From Medial Instep 42 cm Ankle Left: Right: Point of Measurement: 12 cm From Medial Instep 29 cm Vascular Assessment Pulses: David Jennings, David Jennings ([id number]) [Right:[id number] Page 3 of 8] Dorsalis Pedis Palpable: [Right:Yes] Electronic Signature(s) Signed: 10/26/2022 12:04:39 PM By: Gretta Cool, BSN,  RN, CWS, Kim RN, BSN Entered By: Gretta Cool, BSN, RN, CWS, Kim on 10/26/2022 08:30:24 -------------------------------------------------------------------------------- Multi Wound Chart Details Patient Name: Date of Service: [name], [name] 10/26/2022 8:15 A M Medical Record Number: [id number] Patient Account Number: [id number] Date of Birth/Sex: Treating RN: [birth year] (82 y.o. David Jennings Primary Care Ladislaus Repsher: [name] Other Clinician: Referring Corah Willeford: Treating Isaac Dubie/Extender: [name] in Treatment: 3 Vital Signs Height(in): 69 Pulse(bpm): 64 Weight(lbs): 248 Blood Pressure(mmHg): 125/76 Body Mass Index(BMI): 36.6 Temperature(F): 97.7 Respiratory Rate(breaths/min): 16 [1:Photos:] [N/A:N/A]  Right, Anterior Lower Leg N/A N/A Wound Location: Skin T ear/Laceration N/A N/A Wounding Event: Venous Leg Ulcer N/A N/A Primary Etiology: Asthma, Coronary Artery Disease, N/A N/A Comorbid History: Hypertension 09/28/2022 N/A N/A Date Acquired: 3 N/A N/A Weeks of Treatment: Open N/A N/A Wound Status: No N/A N/A Wound Recurrence: 2x2.1x0.2 N/A N/A Measurements L x W x D (cm) 3.299 N/A N/A A (cm) : rea 0.66 N/A N/A Volume (cm) : 70.00% N/A N/A % Reduction in Area: 70.00% N/A N/A % Reduction in Volume: Full Thickness Without Exposed N/A N/A Classification: Support Structures Medium N/A N/A Exudate Amount: Serosanguineous N/A N/A Exudate Type: red, brown N/A N/A Exudate Color: Flat and Intact N/A N/A Wound Margin: Large (67-100%) N/A N/A Granulation Amount: Red, Hyper-granulation N/A N/A Granulation Quality: None Present (0%) N/A N/A Necrotic Amount: Fat Layer (Subcutaneous Tissue): Yes N/A N/A Exposed Structures: Fascia: No Tendon: No Muscle: No Joint: No Bone: No None N/A N/A Epithelialization: David Jennings, David Jennings ([id number]) [id number] Page 4 of 8 Treatment Notes Electronic  Signature(s) Signed: 10/26/2022 12:04:39 PM By: Gretta Cool, BSN, RN, CWS, Kim RN, BSN Entered By: Gretta Cool, BSN, RN, CWS, Kim on 10/26/2022 19:37:90 -------------------------------------------------------------------------------- Multi-Disciplinary Care Plan Details Patient Name: Date of Service: [name], [name] 10/26/2022 8:15 A M Medical Record Number: [id number] Patient Account Number: [id number] Date of Birth/Sex: Treating RN: 1941-02-24 (82 y.o. David Jennings Primary Care Gwendalynn Eckstrom: [name] Other Clinician: Referring Krista Godsil: Treating Tierre Netto/Extender: [name] in Treatment: 3 Active Inactive Venous Leg Ulcer Nursing Diagnoses: Potential for venous Insuffiency (use before diagnosis confirmed) Goals: Patient will maintain optimal edema control Date Initiated: 10/12/2022 Target Resolution Date: 11/02/2022 Goal Status: Active Patient/caregiver will verbalize understanding of disease process and disease management Date Initiated: 10/12/2022 Target Resolution Date: 11/02/2022 Goal Status: Active Verify adequate tissue perfusion prior to therapeutic compression application Date Initiated: 10/12/2022 Target Resolution Date: 11/02/2022 Goal Status: Active Interventions: Assess peripheral edema status every visit. Compression as ordered Provide education on venous insufficiency Treatment Activities: Therapeutic compression applied : 10/12/2022 Notes: Wound/Skin Impairment Nursing Diagnoses: Impaired tissue integrity Knowledge deficit related to ulceration/compromised skin integrity Goals: Patient/caregiver will verbalize understanding of skin care regimen Date Initiated: 10/12/2022 Target Resolution Date: 10/26/2022 Goal Status: Active Ulcer/skin breakdown will have a volume reduction of 30% by week 4 Date Initiated: 10/12/2022 Target Resolution Date: 11/02/2022 Goal Status: Active Interventions: David Jennings, David Jennings ([id number])  [id number] Page 5 of 8 Assess patient/caregiver ability to perform ulcer/skin care regimen upon admission and as needed Provide education on ulcer and skin care Notes: Electronic Signature(s) Signed: 10/26/2022 12:04:39 PM By: Gretta Cool, BSN, RN, CWS, Kim RN, BSN Entered By: Gretta Cool, BSN, RN, CWS, Kim on 10/26/2022 09:16:02 -------------------------------------------------------------------------------- Pain Assessment Details Patient Name: Date of Service: [name], [name] 10/26/2022 8:15 A M Medical Record Number: [id number] Patient Account Number: [id number] Date of Birth/Sex: Treating RN: 1941/04/02 (82 y.o. David Jennings Primary Care Tacy Chavis: [name] Other Clinician: Referring Jhan Conery: Treating Caio Devera/Extender: [name] in Treatment: 3 Active Problems Location of Pain Severity and Description of Pain Patient Has Paino No Site Locations Pain Management and Medication Current Pain Management: Electronic Signature(s) Signed: 10/26/2022 12:04:39 PM By: Gretta Cool, BSN, RN, CWS, Kim RN, BSN Entered By: Gretta Cool, BSN, RN, CWS, Kim on 10/26/2022 08:17:54 David Jennings ([id number]) [id number] Page 6 of 8 -------------------------------------------------------------------------------- Patient/Caregiver Education Details Patient Name: Date of Service: [name], [name] 1/26/2024andnbsp8:15 A M Medical Record Number: [id number] Patient Account Number:  [id number] Date of Birth/Gender: Treating RN: [birth year] (82 y.o. David Jennings Primary Care Physician: [name] Other Clinician: Referring Physician: Treating Physician/Extender: [name] in Treatment: 3 Education Assessment Education Provided To: Patient Education Topics Provided Wound/Skin Impairment: Handouts: Caring for Your Ulcer Methods: Demonstration, Explain/Verbal Responses: State content  correctly Electronic Signature(s) Signed: 10/26/2022 12:04:39 PM By: Gretta Cool, BSN, RN, CWS, Kim RN, BSN Entered By: Gretta Cool, BSN, RN, CWS, Kim on 10/26/2022 09:15:00 -------------------------------------------------------------------------------- Wound Assessment Details Patient Name: Date of Service: [name], [name] 10/26/2022 8:15 A M Medical Record Number: [id number] Patient Account Number: [id number] Date of Birth/Sex: Treating RN: 1940/11/05 (82 y.o. David Jennings Primary Care Arin Vanosdol: [name] Other Clinician: Referring Kathline Banbury: Treating Jatavious Peppard/Extender: [name] in Treatment: 3 Wound Status Wound Number: 1 Primary Etiology: Venous Leg Ulcer Wound Location: Right, Anterior Lower Leg Wound Status: Open Wounding Event: Skin Tear/Laceration Comorbid History: Asthma, Coronary Artery Disease, Hypertension Date Acquired: 09/28/2022 Weeks Of Treatment: 3 Clustered Wound: No Photos David Jennings, David Jennings ([id number]) [id number] Page 7 of 8 Wound Measurements Length: (cm) Width: (cm) Depth: (cm) Area: (cm) Volume: (cm) 2 % Reduction in Area: 70% 2.1 % Reduction in Volume: 70% 0.2 Epithelialization: None 3.299 Tunneling: No 0.66 Undermining: No Wound Description Classification: Full Thickness Without Exposed Suppor Wound Margin: Flat and Intact Exudate Amount: Medium Exudate Type: Serosanguineous Exudate Color: red, brown t Structures Foul Odor After Cleansing: No Slough/Fibrino Yes Wound Bed Granulation Amount: Large (67-100%) Exposed Structure Granulation Quality: Red, Hyper-granulation Fascia Exposed: No Necrotic Amount: None Present (0%) Fat Layer (Subcutaneous Tissue) Exposed: Yes Tendon Exposed: No Muscle Exposed: No Joint Exposed: No Bone Exposed: No Treatment Notes Wound #1 (Lower Leg) Wound Laterality: Right, Anterior Cleanser Peri-Wound Care AandD Ointment Discharge Instruction: Apply AandD  Ointment around wound to help with sticking Topical Primary Dressing Hydrofera Blue Ready Transfer Foam, 4x5 (in/in) Discharge Instruction: Apply Hydrofera Blue Ready to wound bed as directed Secondary Dressing Zetuvit Plus 4x4 (in/in) Secured With Compression Wrap 3-LAYER WRAP - Profore Lite LF 3 Multilayer Compression Bandaging System Discharge Instruction: Apply 3 multi-layer wrap as prescribed. Compression Stockings Environmental education officer) Signed: 10/26/2022 12:04:39 PM By: Gretta Cool, BSN, RN, CWS, Kim RN, BSN Entered By: Gretta Cool, BSN, RN, CWS, Kim on 10/26/2022 32:67:12 David Jennings ([id number]) [id number] Page 8 of 8 -------------------------------------------------------------------------------- Vitals Details Patient Name: Date of Service: [name], [name] 10/26/2022 8:15 A M Medical Record Number: [id number] Patient Account Number: [id number] Date of Birth/Sex: Treating RN: 1941/06/13 (82 y.o. David Jennings, David Jennings Primary Care Aundrea Higginbotham: [name] Other Clinician: Referring Joeziah Voit: Treating Giulio Bertino/Extender: [name] in Treatment: 3 Vital Signs Time Taken: 08:14 Temperature (F): 97.7 Height (in): 69 Pulse (bpm): 64 Weight (lbs): 248 Respiratory Rate (breaths/min): 16 Body Mass Index (BMI): 36.6 Blood Pressure (mmHg): 125/76 Reference Range: 80 - 120 mg / dl Electronic Signature(s) Signed: 10/26/2022 12:04:39 PM By: Gretta Cool, BSN, RN, CWS, Kim RN, BSN Entered By: Gretta Cool, BSN, RN, CWS, Kim on 10/26/2022 08:17:49

## 2022-10-26 NOTE — Progress Notes (Signed)
ESTEFAN, PATTISON ([id number]) [id number] Page 1 of 7 Visit Report for 10/26/2022 Chief Complaint Document Details Patient Name: Date of Service: David [name], [name] 10/26/2022 8:15 David M Medical Record Number: [id number] Patient Account Number: [id number] Date of Birth/Sex: Treating David Jennings: 12-04-1940 (82 y.o. David Jennings Primary Care Provider: Derinda Jennings Other Clinician: Referring Provider: Treating Provider/Extender: David Jennings in Treatment: 3 Information Obtained from: Patient Chief Complaint Right LE Ulcer Electronic Signature(s) Signed: 10/26/2022 2:17:33 PM By: [name] Entered By: Worthy Keeler on 10/26/2022 08:19:12 -------------------------------------------------------------------------------- HPI Details Patient Name: Date of Service: David Jennings, [name] 10/26/2022 8:15 David M Medical Record Number: [id number] Patient Account Number: [id number] Date of Birth/Sex: Treating David Jennings: 04-Aug-1941 (82 y.o. David Jennings Primary Care Provider: Derinda Jennings Other Clinician: Referring Provider: Treating Provider/Extender: David Jennings in Treatment: 3 History of Present Illness HPI Description: 10-04-2022 upon evaluation today patient presents for David wound of his right anterior lower leg which is just below the knee and began on 12-29- 2023. This was initially David strike to the leg which subsequently ended up with what appears to be David hematoma and the patient ended up of course with the open wound that we currently see. He does have long-term use of anticoagulant therapy he is specifically on I believe the Eliquis. He also has atrial fibrillation which is why he is on the blood thinners and likely what led to the wound currently. With that being said he is not currently on any antibiotics. Patient does have David history of cerebrovascular disease, atrial fibrillation, coronary artery disease, and  hypertension. 10-12-2022 upon evaluation today patient appears to be doing well currently in regard to his wound on the leg. He is showing signs of good improvement. Fortunately there does not appear to be any signs of infection he again is on David blood thinner therefore we are using Xeroform gauze to loosen up David lot of necrotic tissue here and that seems to have done David good job. He does have some sharp debridement necessary today and I think we can David that without causing any significant bleeding which is good news the more that we get off the better and I think we can keep this moving in the right direction. 10-19-2022 upon evaluation today patient actually is showing signs of improvement this is good news. I David think the wound is measuring smaller but it does seem to be trapping David lot of fluid underneath. For that reason I would like to see about going ahead and switch him to David Hydrofera Blue dressing to see if this could be beneficial is also very friable and David little bit hyper granulated due to the excessive moisture. I think that the silver cell may be trapping some fluid here. David Jennings, David Jennings ([id number]) [id number] Page 2 of 7 10-26-2022 upon evaluation today patient appears to be doing better currently in regard to the wound on the anterior portion of his leg. He has been tolerating the dressing changes without complication and overall I am extremely pleased with where we stand today. I David not see any signs of infection at this time. Electronic Signature(s) Signed: 10/30/2022 4:09:24 PM By: [name] Entered By: Worthy Keeler on 10/30/2022 33:29:51 -------------------------------------------------------------------------------- Physical Exam Details Patient Name: Date of Service: David [name], [name] 10/26/2022 8:15 David M Medical Record Number: [id number] Patient Account Number: [id number] Date of Birth/Sex: Treating David Jennings: [birth year] (  82 y.o. David Jennings Primary Care Provider: Derinda Jennings Other Clinician: Referring Provider: Treating Provider/Extender: David Jennings in Treatment: 3 Constitutional Well-nourished and well-hydrated in no acute distress. Respiratory normal breathing without difficulty. Psychiatric this patient is able to make decisions and demonstrates good insight into disease process. Alert and Oriented x 3. pleasant and cooperative. Notes Upon inspection patient's wound bed actually showed signs of good granulation epithelization at this point. I David not see any evidence that there is worsening overall and I David think that he is on the right track as far as that is concerned. In general I think that he is making excellent progress. There is no need for sharp debridement today. Electronic Signature(s) Signed: 10/30/2022 4:09:47 PM By: [name] Entered By: Worthy Keeler on 10/30/2022 16:09:47 -------------------------------------------------------------------------------- Physician Orders Details Patient Name: Date of Service: David Jennings, [name] 10/26/2022 8:15 David M Medical Record Number: [id number] Patient Account Number: [id number] Date of Birth/Sex: Treating David Jennings: 1941/05/04 (82 y.o. David Jennings Primary Care Provider: Derinda Jennings Other Clinician: Referring Provider: Treating Provider/Extender: David Jennings in Treatment: 3 Verbal / Phone Orders: No Diagnosis [address] 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 - please fax all orders to janci at [facility] at [phone] Return David Bathing/ Shower/ Hygiene May shower with wound dressing protected with water repellent cover or cast protector. - Cast protector from [employer] or local pharmacy. Anesthetic (Use 'Patient Medications' Section for Anesthetic Order Entry) Lidocaine applied to wound bed Edema Control - Lymphedema / Segmental Compressive Device / Other Elevate, Exercise Daily and David void Standing for Long Periods of Time. Elevate legs to the level of the heart and pump ankles as often as possible Elevate leg(s) parallel to the floor when sitting. Wound Treatment Wound #1 - Lower Leg Wound Laterality: Right, Anterior Peri-Wound Care: AandD Ointment 1 x Per Day/30 Days Discharge Instructions: Apply AandD Ointment around wound to help with sticking Prim Dressing: Hydrofera Blue Ready Transfer Foam, 4x5 (in/in) 1 x Per Day/30 Days ary Discharge Instructions: Apply Hydrofera Blue Ready to wound bed as directed Secondary Dressing: Zetuvit Plus 4x4 (in/in) 1 x Per Day/30 Days Compression Wrap: 3-LAYER WRAP - Profore Lite LF 3 Multilayer Compression Bandaging System 1 x Per Day/30 Days Discharge Instructions: Apply 3 multi-layer wrap as prescribed. Electronic Signature(s) Signed: 10/26/2022 12:04:39 PM By: Gretta Cool, BSN, David Jennings, CWS, Kim David Jennings, BSN Signed: 10/26/2022 2:17:33 PM By: [name] Entered By: Gretta Cool BSN, David Jennings, CWS, Kim on 10/26/2022 09:14:31 -------------------------------------------------------------------------------- Problem List Details Patient Name: Date of Service: David Jennings, [name] 10/26/2022 8:15 David M Medical Record Number: [id number] Patient Account Number: [id number] Date of Birth/Sex: Treating David Jennings: January 21, 1941 (81 y.o. David Jennings Primary Care Provider: Derinda Jennings Other Clinician: Referring  Provider: Treating Provider/Extender: David Jennings in Treatment: 3 Active Problems ICD-10 Encounter Code Description Active Date MDM Diagnosis I87.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 of right 10/04/2022 No Yes lower extremity David Jennings, David Jennings ([id number]) [id number] Page 4 of  7 L97.812 Non-pressure chronic ulcer of other part of right lower leg with fat layer 10/04/2022 No Yes exposed David Jennings (primary) hypertension 10/04/2022 No Yes I25.10 Atherosclerotic heart disease of native coronary artery without angina pectoris 10/04/2022 No Yes I67.89 Other cerebrovascular disease 10/04/2022 No Yes Z79.01 Long term (current) use of anticoagulants 10/04/2022 No Yes I48.0 Paroxysmal atrial fibrillation 10/04/2022 No Yes Inactive Problems Resolved Problems Electronic Signature(s) Signed: 10/26/2022 2:17:33 PM By: [name] Entered By: Worthy Keeler on 10/26/2022 08:19:05 -------------------------------------------------------------------------------- Progress Note Details Patient Name: Date of Service: David Jennings, [name] 10/26/2022 8:15 David M Medical Record Number: [id number] Patient Account Number: [id number] Date of Birth/Sex: Treating David Jennings: 25-Aug-1941 (82 y.o. David Jennings Primary Care Provider: Derinda Jennings Other Clinician: Referring Provider: Treating Provider/Extender: David Jennings in Treatment: 3 Subjective Chief Complaint Information obtained from Patient Right LE Ulcer History of Present Illness (HPI) 10-04-2022 upon evaluation today patient presents for David wound of his right anterior lower leg which is just below the knee and began on 09-28-2022. This was initially David strike to the leg which subsequently ended up with what appears to be David hematoma and the patient ended up of course with the open wound that we currently see. He does have long-term use of anticoagulant  therapy he is specifically on I believe the Eliquis. He also has atrial fibrillation which is why he is on the blood thinners and likely what led to the wound currently. With that being said he is not currently on any antibiotics. Patient does have David history of cerebrovascular disease, atrial fibrillation, coronary artery disease, and hypertension. 10-12-2022 upon evaluation today patient appears to be doing well currently in regard to his wound on the leg. He is showing signs of good improvement. Fortunately there does not appear to be any signs of infection he again is on David blood thinner therefore we are using Xeroform gauze to loosen up David lot of necrotic tissue here and that seems to have done David good job. He does have some sharp debridement necessary today and I think we can David that without David Jennings, David Jennings ([id number]) [id number] Page 5 of 7 causing any significant bleeding which is good news the more that we get off the better and I think we can keep this moving in the right direction. 10-19-2022 upon evaluation today patient actually is showing signs of improvement this is good news. I David think the wound is measuring smaller but it does seem to be trapping David lot of fluid underneath. For that reason I would like to see about going ahead and switch him to David Hydrofera Blue dressing to see if this could be beneficial is also very friable and David little bit hyper granulated due to the excessive moisture. I think that the silver cell may be trapping some fluid here. 10-26-2022 upon evaluation today patient appears to be doing better currently in regard to the wound on the anterior portion of his leg. He has been tolerating the dressing changes without complication and overall I am extremely pleased with where we stand today. I David not see any signs of infection at this time. Objective Constitutional Well-nourished and well-hydrated in no acute distress. Vitals Time Taken:  8:14 AM, Height: 69 in, Weight: 248 lbs, BMI: 36.6, Temperature: 97.7 F, Pulse: 64 bpm, Respiratory Rate: 16 breaths/min, Blood Pressure: 125/76 mmHg. Respiratory normal breathing without difficulty. Psychiatric this patient is able to make decisions and demonstrates good insight  into disease process. Alert and Oriented x 3. pleasant and cooperative. General Notes: Upon inspection patient's wound bed actually showed signs of good granulation epithelization at this point. I David not see any evidence that there is worsening overall and I David think that he is on the right track as far as that is concerned. In general I think that he is making excellent progress. There is no need for sharp debridement today. Integumentary (Hair, Skin) Wound #1 status is Open. Original cause of wound was Skin T ear/Laceration. The date acquired was: 09/28/2022. The wound has been in treatment 3 weeks. The wound is located on the Right,Anterior Lower Leg. The wound measures 2cm length x 2.1cm width x 0.2cm depth; 3.299cm^2 area and 0.66cm^3 volume. There is Fat Layer (Subcutaneous Tissue) exposed. There is no tunneling or undermining noted. There is David medium amount of serosanguineous drainage noted. The wound margin is flat and intact. There is large (67-100%) red, hyper - granulation within the wound bed. There is no necrotic tissue within the wound bed. 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 There was David Three  Layer Compression Therapy Procedure with David pre-treatment ABI of 1.1 by David Barman, David Jennings. Post procedure Diagnosis Wound #1: Same as Pre-Procedure Plan Follow-up Appointments: Return Appointment in 1 week. - please fax all orders to janci at [facility] at [phone] Bathing/ Shower/ Hygiene: May shower with wound dressing protected with water repellent cover or cast protector. - Cast protector from [employer] or local pharmacy. Anesthetic (Use 'Patient Medications' Section for Anesthetic Order Entry): Lidocaine applied to wound bed Edema Control - Lymphedema / Segmental Compressive Device / Other: Elevate, Exercise Daily and Avoid Standing for Long Periods of Time. David Jennings, David Jennings ([id number]) [id number] Page 6 of 7 Elevate legs to the level of the heart and pump ankles as often as possible Elevate leg(s) parallel to the floor when sitting. WOUND #1: - Lower Leg Wound Laterality: Right, Anterior Peri-Wound Care: AandD Ointment 1 x Per Day/30 Days Discharge Instructions: Apply AandD Ointment around wound to help with sticking Prim Dressing: Hydrofera Blue Ready Transfer Foam, 4x5 (in/in) 1 x Per Day/30 Days ary Discharge Instructions: Apply Hydrofera Blue Ready to wound bed as directed Secondary Dressing: Zetuvit Plus 4x4 (in/in) 1 x Per Day/30 Days Com pression Wrap: 3-LAYER WRAP - Profore Lite LF 3 Multilayer Compression Bandaging System 1 x Per Day/30 Days Discharge Instructions: Apply 3 multi-layer wrap as prescribed. 1. I am going to recommend currently that we have the patient continue to monitor for any signs of infection or worsening. Based on what I am seeing I David believe that this is the biggest concern. Overall though I think the Hydrofera Blue is doing David good job and I think that Zetuvit to cover followed by the 3 layer compression wrap is not also keeping edema under control. 2. I am good recommend as well patient should continue to monitor for any  signs of infection or worsening. Obviously if anything changes he knows to let me know such as increased pain but right now we will get David continue with the plan  as before since he is doing so well. We will see patient back for reevaluation in 1 week here in the clinic. If anything worsens or changes patient will contact our office for additional recommendations. Electronic Signature(s) Signed: 10/30/2022 4:10:25 PM By: [name] Entered By: Worthy Keeler on 10/30/2022 16:10:25 -------------------------------------------------------------------------------- SuperBill Details Patient Name: Date of Service: David Jennings, [name] 10/26/2022 Medical Record Number: [id number] Patient Account Number: [id number] Date of Birth/Sex: Treating David Jennings: [birth year] (82 y.o. 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 I10 Essential (primary) hypertension I25.10 Atherosclerotic heart disease of native coronary artery without angina pectoris I67.89 Other cerebrovascular disease Z79.01 Long term (current) use of anticoagulants I48.0 Paroxysmal atrial fibrillation Facility Procedures : CPT4 Code: [id number] Description: (Facility Use Only) [phone] - [place] [facility] RT LEG Modifier: Quantity: 1 Physician Procedures : CPT4 Code Description Modifier [id number] - WC PHYS LEVEL 3 - EST PT ICD-10 Diagnosis Description I87.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 of right lower extremity L97.812 Non-pressure chronic ulcer of other part  of right lower leg with fat layer exposed I10 Essential (primary)  hypertension I25.10 Atherosclerotic heart disease of native coronary artery without angina pectoris YANKY, [place] ([id number]) [id number] Page 7 of 7 Quantity: 1 Electronic Signature(s) Signed: 10/26/2022 2:09:40 PM By: [name] Previous Signature: 10/26/2022 12:04:39 PM Version By: Gretta Cool BSN, David Jennings, CWS, Kim David Jennings, BSN Entered By: Worthy Keeler on 10/26/2022 14:09:39

## 2022-11-02 ENCOUNTER — Encounter: Payer: Medicare PPO | Attending: Physician Assistant | Admitting: Physician Assistant

## 2022-11-02 DIAGNOSIS — L97812 Non-pressure chronic ulcer of other part of right lower leg with fat layer exposed: Secondary | ICD-10-CM | POA: Diagnosis present

## 2022-11-02 DIAGNOSIS — I10 Essential (primary) hypertension: Secondary | ICD-10-CM | POA: Diagnosis not present

## 2022-11-02 DIAGNOSIS — Z7901 Long term (current) use of anticoagulants: Secondary | ICD-10-CM | POA: Insufficient documentation

## 2022-11-02 DIAGNOSIS — I480 Paroxysmal atrial fibrillation: Secondary | ICD-10-CM | POA: Insufficient documentation

## 2022-11-02 DIAGNOSIS — J45909 Unspecified asthma, uncomplicated: Secondary | ICD-10-CM | POA: Insufficient documentation

## 2022-11-02 DIAGNOSIS — I2510 Atherosclerotic heart disease of native coronary artery without angina pectoris: Secondary | ICD-10-CM | POA: Diagnosis not present

## 2022-11-02 NOTE — Progress Notes (Addendum)
David Jennings, David Jennings ([id number]) [id number] Page 1 of 7 Visit Report for 11/02/2022 Chief Complaint Document Details Patient Name: Date of Service: David [name], [name] 11/02/2022 12:00 PM Medical Record Number: [id number] Patient Account Number: [id number] Date of Birth/Sex: Treating RN: 05-08-1941 (82 y.o. David Jennings Primary Care Provider: [name] Other Clinician: Referring Provider: Treating Provider/Extender: [name] in Treatment: 4 Information Obtained from: Patient Chief Complaint Right LE Ulcer Electronic Signature(s) Signed: 11/02/2022 11:38:18 AM By: [name] Entered By: Worthy Keeler on 11/02/2022 11:38:18 -------------------------------------------------------------------------------- HPI Details Patient Name: Date of Service: David [name], [name] 11/02/2022 12:00 PM Medical Record Number: [id number] Patient Account Number: [id number] Date of Birth/Sex: Treating RN: 1941-09-21 (82 y.o. David Jennings Primary Care Provider: [name] Other Clinician: Referring Provider: Treating Provider/Extender: [name] in Treatment: 4 History of Present Illness HPI Description: 10-04-2022 upon evaluation today patient presents for David wound of his right anterior lower leg which is just below the knee and began on 12-29- 2023. This was initially David strike to the leg which subsequently ended up with what appears to be David hematoma and the patient ended up of course with the open wound that we currently see. He does have long-term use of anticoagulant therapy he is specifically on I believe the Eliquis. He also has atrial fibrillation which is why he is on the blood thinners and likely what led to the wound currently. With that being said he is not currently on any antibiotics. Patient does have David history of cerebrovascular disease, atrial fibrillation, coronary artery disease, and  hypertension. 10-12-2022 upon evaluation today patient appears to be doing well currently in regard to his wound on the leg. He is showing signs of good improvement. Fortunately there does not appear to be any signs of infection he again is on David blood thinner therefore we are using Xeroform gauze to loosen up David lot of necrotic tissue here and that seems to have done David good job. He does have some sharp debridement necessary today and I think we can do that without causing any significant bleeding which is good news the more that we get off the better and I think we can keep this moving in the right direction. 10-19-2022 upon evaluation today patient actually is showing signs of improvement this is good news. I do think the wound is measuring smaller but it does seem to be trapping David lot of fluid underneath. For that reason I would like to see about going ahead and switch him to David Hydrofera Blue dressing to see if this could be beneficial is also very friable and David little bit hyper granulated due to the excessive moisture. I think that the silver cell may be trapping some fluid here. David Jennings ([id number]) [id number] Page 2 of 7 10-26-2022 upon evaluation today patient appears to be doing better currently in regard to the wound on the anterior portion of his leg. He has been tolerating the dressing changes without complication and overall I am extremely pleased with where we stand today. I do not see any signs of infection at this time. 11-02-2022 upon evaluation today patient appears to be doing excellent in regard to his wounds. In fact his leg ulcer is showing signs of excellent improvement. Fortunately I do not see any signs of infection locally nor systemically which is great news and overall I am extremely pleased with where we stand at this  point. Electronic Signature(s) Signed: 11/02/2022 12:47:55 PM By: [name] Entered By: Worthy Keeler on  11/02/2022 12:47:55 -------------------------------------------------------------------------------- Physical Exam Details Patient Name: Date of Service: David [name], [name] 11/02/2022 12:00 PM Medical Record Number: [id number] Patient Account Number: [id number] Date of Birth/Sex: Treating RN: 1941-08-31 (82 y.o. David Jennings Primary Care Provider: [name] Other Clinician: Referring Provider: Treating Provider/Extender: [name] in Treatment: 4 Constitutional Well-nourished and well-hydrated in no acute distress. Respiratory normal breathing without difficulty. Psychiatric this patient is able to make decisions and demonstrates good insight into disease process. Alert and Oriented x 3. pleasant and cooperative. Notes Upon inspection patient's wound bed actually showed signs of good granulation epithelization at this point. Fortunately I do not see any evidence of infection which is great news and overall I do believe that we are headed in the right direction. Electronic Signature(s) Signed: 11/02/2022 12:48:21 PM By: [name] Entered By: Worthy Keeler on 11/02/2022 12:48:21 -------------------------------------------------------------------------------- Physician Orders Details Patient Name: Date of Service: David [name], [name] 11/02/2022 12:00 PM Medical Record Number: [id number] Patient Account Number: [id number] Date of Birth/Sex: Treating RN: 1941/03/31 (82 y.o. 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 - please fax all orders to janci at [facility] at [phone] Return David Bathing/ Shower/ Hygiene May shower with wound dressing protected with water repellent cover or cast protector. - Cast protector from [employer] or local pharmacy. Anesthetic (Use 'Patient Medications' Section for Anesthetic Order Entry) Lidocaine applied to wound bed Edema Control - Lymphedema / Segmental Compressive Device / Other Elevate, Exercise Daily and David void Standing for Long Periods of Time. Elevate legs to the level of the heart and pump ankles as often as possible Elevate leg(s) parallel to the floor when sitting. Wound Treatment Wound #1 - Lower Leg Wound Laterality: Right, Anterior Peri-Wound Care: AandD Ointment 1 x Per Day/30 Days Discharge Instructions: Apply AandD Ointment around wound to help with sticking Prim Dressing: Hydrofera Blue Ready Transfer Foam, 4x5 (in/in) 1 x Per Day/30 Days ary Discharge Instructions: Apply Hydrofera Blue Ready to wound bed as directed Secondary Dressing: Zetuvit Plus 4x4 (in/in) 1 x Per Day/30 Days Compression Wrap: 3-LAYER WRAP - Profore Lite LF 3 Multilayer Compression Bandaging System 1 x Per Day/30 Days Discharge Instructions: Apply 3 multi-layer wrap as prescribed. Electronic Signature(s) Signed: 11/02/2022 12:15:08 PM By: Rosalio Loud MSN RN CNS WTA Signed: 11/02/2022 2:41:09 PM By: [name] Entered By: Rosalio Loud on 11/02/2022 12:15:08 -------------------------------------------------------------------------------- Problem List Details Patient Name: Date of Service: David Johns, [name] 11/02/2022 12:00  PM Medical Record Number: [id number] Patient Account Number: [id number] Date of Birth/Sex: Treating RN: July 15, 1941 (81 y.o. David Jennings Primary Care Provider: [name] Other Clinician: Referring Provider: Treating Provider/Extender: [name] in Treatment: 4 Active Problems ICD-10 Encounter Code Description Active Date  MDM Diagnosis KEIDEN, DESKIN ([id number]) [id number] Page 4 of 7 I87.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 of right 10/04/2022 No Yes lower extremity L97.812 Non-pressure chronic ulcer of other part of right lower leg with fat layer 10/04/2022 No Yes exposed I10 Essential (primary) hypertension 10/04/2022 No Yes I25.10 Atherosclerotic heart disease of native coronary artery without angina pectoris 10/04/2022 No Yes I67.89 Other cerebrovascular disease 10/04/2022 No Yes Z79.01 Long term (current) use of anticoagulants 10/04/2022 No Yes I48.0 Paroxysmal atrial fibrillation 10/04/2022 No Yes Inactive Problems Resolved Problems Electronic Signature(s) Signed: 11/02/2022 11:38:15 AM By: [name] Entered By: Worthy Keeler on 11/02/2022 11:38:15 -------------------------------------------------------------------------------- Progress Note Details Patient Name: Date of Service: David [name], [name] 11/02/2022 12:00 PM Medical Record Number: [id number] Patient Account Number: [id number] Date of Birth/Sex: Treating RN: 1941-03-12 (82 y.o. David Jennings Primary Care Provider: [name] Other Clinician: Referring Provider: Treating Provider/Extender: [name] in Treatment: 4 Subjective Chief Complaint Information obtained from Patient Right LE Ulcer History of Present Illness (HPI) 10-04-2022 upon evaluation today patient presents for David wound of his right anterior lower leg which is just below the knee and began on 09-28-2022. This  was initially David strike to the leg which subsequently ended up with what appears to be David hematoma and the patient ended up of course with the open wound that we currently see. He does have long-term use of anticoagulant therapy he is specifically on I believe the Eliquis. He also has atrial fibrillation which is why he is on the blood thinners and likely what led to the wound currently. With that being said he is not currently on any antibiotics. Patient does have David history of cerebrovascular disease, atrial fibrillation, coronary artery disease, and hypertension. David Jennings, David Jennings ([id number]) [id number] Page 5 of 7 10-12-2022 upon evaluation today patient appears to be doing well currently in regard to his wound on the leg. He is showing signs of good improvement. Fortunately there does not appear to be any signs of infection he again is on David blood thinner therefore we are using Xeroform gauze to loosen up David lot of necrotic tissue here and that seems to have done David good job. He does have some sharp debridement necessary today and I think we can do that without causing any significant bleeding which is good news the more that we get off the better and I think we can keep this moving in the right direction. 10-19-2022 upon evaluation today patient actually is showing signs of improvement this is good news. I do think the wound is measuring smaller but it does seem to be trapping David lot of fluid underneath. For that reason I would like to see about going ahead and switch him to David Hydrofera Blue dressing to see if this could be beneficial is also very friable and David little bit hyper granulated due to the excessive moisture. I think that the silver cell may be trapping some fluid here. 10-26-2022 upon evaluation today patient appears to be doing better currently in regard to the wound on the anterior portion of his leg. He has been tolerating the dressing changes without  complication and overall I am extremely pleased with where we stand today. I do not see any signs of infection at this time. 11-02-2022 upon evaluation today patient appears to be doing excellent in regard to his wounds. In fact his leg ulcer is showing signs of excellent improvement. Fortunately I  do not see any signs of infection locally nor systemically which is great news and overall I am extremely pleased with where we stand at this point. Objective Constitutional Well-nourished and well-hydrated in no acute distress. Vitals Time Taken: 11:48 AM, Height: 69 in, Weight: 248 lbs, BMI: 36.6, Temperature: 97.4 F, Pulse: 69 bpm, Respiratory Rate: 16 breaths/min, Blood Pressure: 136/77 mmHg. Respiratory normal breathing without difficulty. Psychiatric this patient is able to make decisions and demonstrates good insight into disease process. Alert and Oriented x 3. pleasant and cooperative. General Notes: Upon inspection patient's wound bed actually showed signs of good granulation epithelization at this point. Fortunately I do not see any evidence of infection which is great news and overall I do believe that we are headed in the right direction. Integumentary (Hair, Skin) Wound #1 status is Open. Original cause of wound was Skin T ear/Laceration. The date acquired was: 09/28/2022. The wound has been in treatment 4 weeks. The wound is located on the Right,Anterior Lower Leg. The wound measures 1.6cm length x 1.1cm width x 0.2cm depth; 1.382cm^2 area and 0.276cm^3 volume. There is Fat Layer (Subcutaneous Tissue) exposed. There is David medium amount of serosanguineous drainage noted. The wound margin is flat and intact. There is large (67-100%) red, hyper - granulation within the wound bed. There is no necrotic tissue within the wound bed. 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 - please fax all orders to janci at [facility] at [phone] Bathing/ Shower/ Hygiene: May shower with wound dressing protected with water repellent cover or cast protector. - Cast protector from [employer] or local pharmacy. Anesthetic (Use 'Patient Medications' Section for Anesthetic Order Entry): Lidocaine applied to wound bed Edema Control - Lymphedema / Segmental Compressive Device / Other: Elevate, Exercise Daily and Avoid Standing for Long Periods of Time. Elevate legs to the level of the heart and pump ankles as often as possible Elevate leg(s) parallel to the floor when sitting. WOUND #1: - Lower Leg Wound Laterality: Right, Anterior Peri-Wound Care: AandD Ointment 1 x Per Day/30 Days Discharge Instructions: Apply AandD Ointment around wound to help with sticking Prim Dressing: Hydrofera Blue Ready Transfer Foam, 4x5 (in/in) 1 x Per Day/30 Days ary Discharge Instructions: Apply Hydrofera Blue Ready to wound bed as directed David Jennings, David Jennings ([id number]) [id number] Page 6 of 7 Secondary Dressing: Zetuvit Plus 4x4 (in/in) 1 x Per Day/30 Days Compression Wrap: 3-LAYER WRAP - Profore Lite LF 3 Multilayer Compression Bandaging System 1 x Per Day/30 Days Discharge Instructions: Apply 3 multi-layer wrap as prescribed. 1. I am going to recommend that we have the patient continue to monitor for any signs of worsening overall I do think we are headed in the right direction his wound is significantly improved even compared to last week and every week we have been seeing him it gets better and better. 2. I am also can recommend the patient  should continue to monitor for any signs of infection obviously if anything changes he should contact the office and let me know. We will see patient back for reevaluation in 1 week here in the clinic. If anything worsens or changes patient will contact our office for  additional recommendations. Electronic Signature(s) Signed: 11/02/2022 12:48:55 PM By: [name] Entered By: Worthy Keeler on 11/02/2022 12:48:55 -------------------------------------------------------------------------------- SuperBill Details Patient Name: Date of Service: David [name], [name] 11/02/2022 Medical Record Number: [id number] Patient Account Number: [id number] Date of Birth/Sex: Treating RN: 20-Feb-1941 (82 y.o. David Jennings Primary Care Provider: [name] Other Clinician: Referring Provider: Treating Provider/Extender: [name] in Treatment: 4 Diagnosis Coding ICD-10 Codes Code Description [phone] Chronic venous hypertension (idiopathic) with ulcer and inflammation of right lower extremity L97.812 Non-pressure chronic ulcer of other part of right lower leg with fat layer exposed I10 Essential (primary) hypertension I25.10 Atherosclerotic heart disease of native coronary artery without angina pectoris I67.89 Other cerebrovascular disease Z79.01 Long term (current) use of anticoagulants I48.0 Paroxysmal atrial fibrillation Facility Procedures : CPT4 Code: [id number] Description: 99213 - WOUND CARE VISIT-LEV 3 EST PT Modifier: Quantity: 1 Physician Procedures : CPT4 Code Description Modifier [id number] - WC PHYS LEVEL 3 - EST PT ICD-10 Diagnosis Description I87.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 of right lower extremity L97.812 Non-pressure chronic ulcer of other part  of right lower leg with fat layer exposed I10 Essential (primary) hypertension I25.10 Atherosclerotic heart disease of native coronary artery without angina  pectoris Quantity: 1 Electronic Signature(s) Signed: 11/02/2022 12:49:14 PM By: [name] Entered By: Worthy Keeler on 11/02/2022 12:49:14 [place], Gerarda Fraction ([id number]) [id number] Page 7 of 7

## 2022-11-02 NOTE — Progress Notes (Addendum)
David Jennings, David Jennings ([id number]) [id number] Page 1 of 9 Visit Report for 11/02/2022 Arrival Information Details Patient Name: Date of Service: David [name], [name] 11/02/2022 12:00 PM Medical Record Number: [id number] Patient Account Number: [id number] Date of Birth/Sex: Treating RN: [birth year] (82 y.o. David Jennings Agness Sibrian: [name] Other Clinician: Referring Traniya Prichett: Treating Kejon Feild/Extender: [name] in Treatment: 4 Visit Information History Since Last Visit Added or deleted any medications: No Patient Arrived: Ambulatory Any new allergies or adverse reactions: No Arrival Time: 11:45 Had David fall or experienced change in No Accompanied By: self activities of daily living that may affect Transfer Assistance: None risk of falls: Patient Identification Verified: Yes Signs or symptoms of abuse/neglect since last visito No Secondary Verification Process Completed: Yes Hospitalized since last visit: No Patient Requires Transmission-Based Precautions: No Implantable device outside of the clinic excluding No Patient Has Alerts: Yes cellular tissue based products placed in the center Patient Alerts: Patient on Blood Thinner since last visit: Has Dressing in Place as Prescribed: Yes Pain Present Now: No Electronic Signature(s) Signed: 11/02/2022 1:21:58 PM By: Rosalio Loud MSN RN CNS WTA Entered By: Rosalio Loud on 11/02/2022 11:48:09 -------------------------------------------------------------------------------- Clinic Level of Jennings Assessment Details Patient Name: Date of Service: David [name], [name] 11/02/2022 12:00 PM Medical Record Number: [id number] Patient Account Number: [id number] Date of Birth/Sex: Treating RN: Oct 28, 1940 (82 y.o. David Jennings [place] Ament: [name] Other Clinician: Referring Adlene Adduci: Treating Forest Redwine/Extender: [name] in Treatment:  4 Clinic Level of Jennings Assessment Items TOOL 4 Quantity Score X- 1 0 Use when only an EandM is performed on FOLLOW-UP visit ASSESSMENTS - Nursing Assessment / Reassessment X- 1 10 Reassessment of Co-morbidities (includes updates in patient status) X- 1 5 Reassessment of Adherence to Treatment Plan David Jennings, David Jennings ([id number]) [id number] Page 2 of 9 ASSESSMENTS - Wound and Skin David ssessment / Reassessment X - Simple Wound Assessment / Reassessment - one wound 1 5 '[]'$  - 0 Complex Wound Assessment / Reassessment - multiple wounds '[]'$  - 0 Dermatologic / Skin Assessment (not related to wound area) ASSESSMENTS - Focused Assessment '[]'$  - 0 Circumferential Edema Measurements - multi extremities '[]'$  - 0 Nutritional Assessment / Counseling / Intervention '[]'$  - 0 Lower Extremity Assessment (monofilament, tuning fork, pulses) '[]'$  - 0 Peripheral Arterial Disease Assessment (using hand held doppler) ASSESSMENTS - Ostomy and/or Continence Assessment and Jennings '[]'$  - 0 Incontinence Assessment and Management '[]'$  - 0 Ostomy Jennings Assessment and Management (repouching, etc.) PROCESS - Coordination of Jennings X - Simple Patient / Family Education for ongoing Jennings 1 15 '[]'$  - 0 Complex (extensive) Patient / Family Education for ongoing Jennings X- 1 10 Staff obtains Programmer, systems, Records, T Results / Process Orders est '[]'$  - 0 Staff telephones HHA, Nursing Homes / Clarify orders / etc '[]'$  - 0 Routine Transfer to another Facility (non-emergent condition) '[]'$  - 0 Routine Hospital Admission (non-emergent condition) '[]'$  - 0 New Admissions / Biomedical engineer / Ordering NPWT Apligraf, etc. , '[]'$  - 0 Emergency Hospital Admission (emergent condition) X- 1 10 Simple Discharge Coordination '[]'$  - 0 Complex (extensive) Discharge Coordination PROCESS - Special Needs '[]'$  - 0 Pediatric / Minor Patient Management '[]'$  - 0 Isolation Patient Management '[]'$  - 0 Hearing / Language / Visual  special needs '[]'$  - 0 Assessment of Community assistance (transportation, D/C planning, etc.) '[]'$  - 0 Additional assistance / Altered mentation '[]'$  - 0 Support Surface(s) Assessment (bed, cushion, seat,  etc.) INTERVENTIONS - Wound Cleansing / Measurement X - Simple Wound Cleansing - one wound 1 5 '[]'$  - 0 Complex Wound Cleansing - multiple wounds X- 1 5 Wound Imaging (photographs - any number of wounds) '[]'$  - 0 Wound Tracing (instead of photographs) X- 1 5 Simple Wound Measurement - one wound '[]'$  - 0 Complex Wound Measurement - multiple wounds INTERVENTIONS - Wound Dressings X - Small Wound Dressing one or multiple wounds 1 10 '[]'$  - 0 Medium Wound Dressing one or multiple wounds '[]'$  - 0 Large Wound Dressing one or multiple wounds '[]'$  - 0 Application of Medications - topical '[]'$  - 0 Application of Medications - injection INTERVENTIONS - Miscellaneous '[]'$  - 0 External ear exam David Jennings, David Jennings ([id number]) [id number] Page 3 of 9 '[]'$  - 0 Specimen Collection (cultures, biopsies, blood, body fluids, etc.) '[]'$  - 0 Specimen(s) / Culture(s) sent or taken to Lab for analysis '[]'$  - 0 Patient Transfer (multiple staff / Harrel Lemon Lift / Similar devices) '[]'$  - 0 Simple Staple / Suture removal (25 or less) '[]'$  - 0 Complex Staple / Suture removal (26 or more) '[]'$  - 0 Hypo / Hyperglycemic Management (close monitor of Blood Glucose) '[]'$  - 0 Ankle / Brachial Index (ABI) - David not check if billed separately X- 1 5 Vital Signs Has the patient been seen at the hospital within the last three years: Yes Total Score: 85 Level Of Jennings: New/Established - Level 3 Electronic Signature(s) Signed: 11/02/2022 1:21:58 PM By: Rosalio Loud MSN RN CNS WTA Entered By: Rosalio Loud on 11/02/2022 12:34:29 -------------------------------------------------------------------------------- Encounter Discharge Information Details Patient Name: Date of Service: David Johns, [name] 11/02/2022 12:00  PM Medical Record Number: [id number] Patient Account Number: [id number] Date of Birth/Sex: Treating RN: [birth year] (82 y.o. David Jennings Clare Fennimore: [name] Other Clinician: Referring Tyus Kallam: Treating Tniyah Nakagawa/Extender: [name] in Treatment: 4 Encounter Discharge Information Items Discharge Condition: Stable Ambulatory Status: Ambulatory Discharge Destination: Home Transportation: Private Auto Accompanied By: self Schedule Follow-up Appointment: Yes Clinical Summary of Jennings: Electronic Signature(s) Signed: 11/02/2022 12:35:13 PM By: Rosalio Loud MSN RN CNS WTA Entered By: Rosalio Loud on 11/02/2022 12:35:13 -------------------------------------------------------------------------------- Lower Extremity Assessment Details Patient Name: Date of Service: David [name], [name] 11/02/2022 12:00 PM David Jennings ([id number]) [id number] Page 4 of 9 Medical Record Number: [id number] Patient Account Number: [id number] Date of Birth/Sex: Treating RN: 08-Apr-1941 (82 y.o. David Jennings Gyasi Hazzard: [name] Other Clinician: Referring Kasen Adduci: Treating Courteney Alderete/Extender: [name] in Treatment: 4 Edema Assessment Assessed: Shirlyn Goltz: No] [Right: No] [Left: Edema] [Right: :] Calf Left: Right: Point of Measurement: 34 cm From Medial Instep 40 cm Ankle Left: Right: Point of Measurement: 12 cm From Medial Instep 28 cm Vascular Assessment Pulses: Dorsalis Pedis Palpable: [Right:Yes] Electronic Signature(s) Signed: 11/02/2022 1:21:58 PM By: Rosalio Loud MSN RN CNS WTA Entered By: Rosalio Loud on 11/02/2022 11:58:21 -------------------------------------------------------------------------------- Multi Wound Chart Details Patient Name: Date of Service: David Johns, [name] 11/02/2022 12:00 PM Medical Record Number: [id number] Patient Account Number: [id number] Date of  Birth/Sex: Treating RN: Apr 20, 1941 (82 y.o. David Jennings Calixto Pavel: [name] Other Clinician: Referring Yehuda Printup: Treating Wisam Siefring/Extender: [name] in Treatment: 4 Vital Signs Height(in): 69 Pulse(bpm): 88 Weight(lbs): 248 Blood Pressure(mmHg): 136/77 Body Mass Index(BMI): 36.6 Temperature(F): 97.4 Respiratory Rate(breaths/min): 16 [1:Photos:] [N/David:N/David] Right, Anterior Lower Leg N/David N/David Wound Location: Skin Tear/Laceration N/David N/David Wounding Event: Venous Leg Ulcer N/David  N/David Primary Etiology: David Jennings, David Jennings ([id number]) [id number] Page 5 of 9 Asthma, Coronary Artery Disease, N/David N/David Comorbid History: Hypertension 09/28/2022 N/David N/David Date Acquired: 4 N/David N/David Weeks of Treatment: Open N/David N/David Wound Status: No N/David N/David Wound Recurrence: 1.6x1.1x0.2 N/David N/David Measurements L x W x D (cm) 1.382 N/David N/David David (cm) : rea 0.276 N/David N/David Volume (cm) : 87.40% N/David N/David % Reduction in Area: 87.40% N/David N/David % Reduction in Volume: Full Thickness Without Exposed N/David N/David Classification: Support Structures Medium N/David N/David Exudate Amount: Serosanguineous N/David N/David Exudate Type: red, brown N/David N/David Exudate Color: Flat and Intact N/David N/David Wound Margin: Large (67-100%) N/David N/David Granulation Amount: Red, Hyper-granulation N/David N/David Granulation Quality: None Present (0%) N/David N/David Necrotic Amount: Fat Layer (Subcutaneous Tissue): Yes N/David N/David Exposed Structures: Fascia: No Tendon: No Muscle: No Joint: No Bone: No None N/David N/David Epithelialization: Treatment Notes Electronic Signature(s) Signed: 11/02/2022 1:21:58 PM By: Rosalio Loud MSN RN CNS WTA Entered By: Rosalio Loud on 11/02/2022 11:58:36 -------------------------------------------------------------------------------- Multi-Disciplinary Jennings Plan Details Patient Name: Date of Service: David Johns, [name] 11/02/2022 12:00 PM Medical Record Number:  [id number] Patient Account Number: [id number] Date of Birth/Sex: Treating RN: 1940/11/06 (82 y.o. David Jennings Lorrena Goranson: [name] Other Clinician: Referring Jacquelin Krajewski: Treating Aritzel Krusemark/Extender: [name] in Treatment: 4 Active Inactive Venous Leg Ulcer Nursing Diagnoses: Potential for venous Insuffiency (use before diagnosis confirmed) Goals: Patient will maintain optimal edema control Date Initiated: 10/12/2022 Target Resolution Date: 11/02/2022 Goal Status: Active Patient/caregiver will verbalize understanding of disease process and disease management Date Initiated: 10/12/2022 Target Resolution Date: 11/02/2022 Goal Status: Active Verify adequate tissue perfusion prior to therapeutic compression application Date Initiated: 10/12/2022 Target Resolution Date: 11/02/2022 Goal Status: Active Interventions: David Jennings, David Jennings ([id number]) [id number] Page 6 of 9 Assess peripheral edema status every visit. Compression as ordered Provide education on venous insufficiency Treatment Activities: Therapeutic compression applied : 10/12/2022 Notes: Wound/Skin Impairment Nursing Diagnoses: Impaired tissue integrity Knowledge deficit related to ulceration/compromised skin integrity Goals: Patient/caregiver will verbalize understanding of skin Jennings regimen Date Initiated: 10/12/2022 Target Resolution Date: 10/26/2022 Goal Status: Active Ulcer/skin breakdown will have David volume reduction of 30% by week 4 Date Initiated: 10/12/2022 Target Resolution Date: 11/02/2022 Goal Status: Active Interventions: Assess patient/caregiver ability to perform ulcer/skin Jennings regimen upon admission and as needed Provide education on ulcer and skin Jennings Notes: Electronic Signature(s) Signed: 11/02/2022 12:13:41 PM By: Rosalio Loud MSN RN CNS WTA Entered By: Rosalio Loud on 11/02/2022  12:13:41 -------------------------------------------------------------------------------- Pain Assessment Details Patient Name: Date of Service: David Johns, [name] 11/02/2022 12:00 PM Medical Record Number: [id number] Patient Account Number: [id number] Date of Birth/Sex: Treating RN: 01-Jan-1941 (82 y.o. David Jennings Sahithi Ordoyne: [name] Other Clinician: Referring Finleigh Cheong: Treating Sye Schroepfer/Extender: [name] in Treatment: 4 Active Problems Location of Pain Severity and Description of Pain Patient Has Paino No Site Locations David Jennings, David Jennings ([id number]) [id number] Page 7 of 9 Pain Management and Medication Current Pain Management: Electronic Signature(s) Signed: 11/02/2022 1:21:58 PM By: Rosalio Loud MSN RN CNS WTA Entered By: Rosalio Loud on 11/02/2022 11:50:18 -------------------------------------------------------------------------------- Patient/Caregiver Education Details Patient Name: Date of Service: David Johns, [name] 2/2/2024andnbsp12:00 PM Medical Record Number: [id number] Patient Account Number: [id number] Date of Birth/Gender: Treating RN: Dec 19, 1940 (82 y.o. David Jennings Physician: [name] Other Clinician: Referring Physician: Treating Physician/Extender: [name] in Treatment: 4 Education Assessment Education  Provided To: Patient Education Topics Provided Wound/Skin Impairment: Handouts: Caring for Your Ulcer Methods: Explain/Verbal Responses: State content correctly Electronic Signature(s) Signed: 11/02/2022 1:21:58 PM By: Rosalio Loud MSN RN CNS WTA Entered By: Rosalio Loud on 11/02/2022 12:13:35 David Jennings ([id number]) [id number] Page 8 of 9 -------------------------------------------------------------------------------- Wound Assessment Details Patient Name: Date of Service: David [name], David NA LD  J. 11/02/2022 12:00 PM Medical Record Number: [id number] Patient Account Number: [id number] Date of Birth/Sex: Treating RN: [birth year] (82 y.o. David Jennings Glory Graefe: [name] Other Clinician: Referring Bentley Fissel: Treating Bethanny Toelle/Extender: [name] in Treatment: 4 Wound Status Wound Number: 1 Primary Etiology: Venous Leg Ulcer Wound Location: Right, Anterior Lower Leg Wound Status: Open Wounding Event: Skin Tear/Laceration Comorbid History: Asthma, Coronary Artery Disease, Hypertension Date Acquired: 09/28/2022 Weeks Of Treatment: 4 Clustered Wound: No Photos Wound Measurements Length: (cm) 1.6 Width: (cm) 1.1 Depth: (cm) 0.2 Area: (cm) 1.382 Volume: (cm) 0.276 % Reduction in Area: 87.4% % Reduction in Volume: 87.4% Epithelialization: None Wound Description Classification: Full Thickness Without Exposed Suppor Wound Margin: Flat and Intact Exudate Amount: Medium Exudate Type: Serosanguineous Exudate Color: red, brown t Structures Foul Odor After Cleansing: No Slough/Fibrino Yes Wound Bed Granulation Amount: Large (67-100%) Exposed Structure Granulation Quality: Red, Hyper-granulation Fascia Exposed: No Necrotic Amount: None Present (0%) Fat Layer (Subcutaneous Tissue) Exposed: Yes Tendon Exposed: No Muscle Exposed: No Joint Exposed: No Bone Exposed: No Treatment Notes Wound #1 (Lower Leg) Wound Laterality: Right, Anterior Cleanser Peri-Wound Jennings AandD David Jennings, David Jennings ([id number]) [id number] Page 9 of 9 Discharge Instruction: Apply AandD Ointment around wound to help with sticking Topical Primary Dressing Hydrofera Blue Ready Transfer Foam, 4x5 (in/in) Discharge Instruction: Apply Hydrofera Blue Ready to wound bed as directed Secondary Dressing Zetuvit Plus 4x4 (in/in) Secured With Compression Wrap 3-LAYER WRAP - Profore Lite LF 3 Multilayer Compression Bandaging  System Discharge Instruction: Apply 3 multi-layer wrap as prescribed. Compression Stockings Add-Ons Electronic Signature(s) Signed: 11/02/2022 1:21:58 PM By: Rosalio Loud MSN RN CNS WTA Entered By: Rosalio Loud on 11/02/2022 11:57:23 -------------------------------------------------------------------------------- Vitals Details Patient Name: Date of Service: David Johns, [name] 11/02/2022 12:00 PM Medical Record Number: [id number] Patient Account Number: [id number] Date of Birth/Sex: Treating RN: Mar 08, 1941 (82 y.o. David Jennings Marks Scalera: [name] Other Clinician: Referring Emberlie Gotcher: Treating Xandrea Clarey/Extender: [name] in Treatment: 4 Vital Signs Time Taken: 11:48 Temperature (F): 97.4 Height (in): 69 Pulse (bpm): 69 Weight (lbs): 248 Respiratory Rate (breaths/min): 16 Body Mass Index (BMI): 36.6 Blood Pressure (mmHg): 136/77 Reference Range: 80 - 120 mg / dl Electronic Signature(s) Signed: 11/02/2022 1:21:58 PM By: Rosalio Loud MSN RN CNS WTA Entered By: Rosalio Loud on 11/02/2022 11:50:12

## 2022-11-09 ENCOUNTER — Encounter: Payer: Medicare PPO | Admitting: Physician Assistant

## 2022-11-09 DIAGNOSIS — L97812 Non-pressure chronic ulcer of other part of right lower leg with fat layer exposed: Secondary | ICD-10-CM | POA: Diagnosis not present

## 2022-11-09 NOTE — Progress Notes (Signed)
WARD, COMAS ([id number]) [id number] Page 1 of 8 Visit Report for 11/09/2022 Chief Complaint Document Details Patient Name: Date of Service: [name], [name] 11/09/2022 7:45 A M Medical Record Number: [id number] Patient Account Number: [id number] Date of Birth/Sex: Treating RN: 19-Jun-1941 (82 y.o. [name] Primary Care Provider: [name] Other Clinician: Referring Provider: Treating Provider/Extender: [name] in Treatment: 5 Information Obtained from: Patient Chief Complaint Right LE Ulcer Electronic Signature(s) Signed: 11/09/2022 8:00:01 AM By: [name] Entered By: Worthy Keeler on 11/09/2022 08:00:01 -------------------------------------------------------------------------------- Debridement Details Patient Name: Date of Service: [name], [name] 11/09/2022 7:45 A M Medical Record Number: [id number] Patient Account Number: [id number] Date of Birth/Sex: Treating RN: August 10, 1941 (82 y.o. [name] Primary Care Provider: [name] Other Clinician: Referring Provider: Treating Provider/Extender: [name] in Treatment: 5 Debridement Performed for Assessment: Wound #1 Right,Anterior Lower Leg Performed By: Physician Tommie Sams., PA-C Debridement Type: Debridement Severity of Tissue Pre Debridement: Fat layer exposed Level of Consciousness (Pre-procedure): Awake and Alert Pre-procedure Verification/Time Out No Taken: Start Time: 08:03 T Area Debrided (L x W): otal 0.7 (cm) x 0.6 (cm) = 0.42 (cm) Tissue and other material debrided: Viable, Non-Viable, Slough, Subcutaneous, Skin: Dermis , Skin: Epidermis, Slough Level: Skin/Subcutaneous Tissue Debridement Description: Excisional Instrument: Curette Bleeding: Minimum Hemostasis Achieved: Pressure Response to Treatment: Procedure was tolerated well Level of Consciousness (Post- Awake and  Alert procedure): David Jennings, David Jennings ([id number]) [id number] Page 2 of 8 Post Debridement Measurements of Total Wound Length: (cm) 0.7 Width: (cm) 0.6 Depth: (cm) 0.2 Volume: (cm) 0.066 Character of Wound/Ulcer Post Debridement: Stable Severity of Tissue Post Debridement: Fat layer exposed Post Procedure Diagnosis Same as Pre-procedure Electronic Signature(s) Unsigned Entered By: Rosalio Loud on 11/09/2022 08:04:47 -------------------------------------------------------------------------------- HPI Details Patient Name: Date of Service: [name], [name] 11/09/2022 7:45 A M Medical Record Number: [id number] Patient Account Number: [id number] Date of Birth/Sex: Treating RN: 11-23-1940 (82 y.o. [name] Primary Care Provider: [name] Other Clinician: Referring Provider: Treating Provider/Extender: [name] in Treatment: 5 History of Present Illness HPI Description: 10-04-2022 upon evaluation today patient presents for a wound of his right anterior lower leg which is just below the knee and began on 12-29- 2023. This was initially a strike to the leg which subsequently ended up with what appears to be a hematoma and the patient ended up of course with the open wound that we currently see. He does have long-term use of anticoagulant therapy he is specifically on I believe the Eliquis. He also has atrial fibrillation which is why he is on the blood thinners and likely what led to the wound currently. With that being said he is not currently on any antibiotics. Patient does have a history of cerebrovascular disease, atrial fibrillation, coronary artery disease, and hypertension. 10-12-2022 upon evaluation today patient appears to be doing well currently in regard to his wound on the leg. He is showing signs of good improvement. Fortunately there does not appear to be any signs of infection he again is on a blood thinner  therefore we are using Xeroform gauze to loosen up a lot of necrotic tissue here and that seems to have done a good job. He does have some sharp debridement necessary today and I think we can do that without causing any significant bleeding which is good news the more that we get off the better and  I think we can keep this moving in the right direction. 10-19-2022 upon evaluation today patient actually is showing signs of improvement this is good news. I do think the wound is measuring smaller but it does seem to be trapping a lot of fluid underneath. For that reason I would like to see about going ahead and switch him to a Hydrofera Blue dressing to see if this could be beneficial is also very friable and a little bit hyper granulated due to the excessive moisture. I think that the silver cell may be trapping some fluid here. 10-26-2022 upon evaluation today patient appears to be doing better currently in regard to the wound on the anterior portion of his leg. He has been tolerating the dressing changes without complication and overall I am extremely pleased with where we stand today. I do not see any signs of infection at this time. 11-02-2022 upon evaluation today patient appears to be doing excellent in regard to his wounds. In fact his leg ulcer is showing signs of excellent improvement. Fortunately I do not see any signs of infection locally nor systemically which is great news and overall I am extremely pleased with where we stand at this point. 11-09-2022 upon evaluation today patient actually appears to be making some excellent progress here. I am actually very pleased with where things stand and I think he is making also believes that strides towards getting this wound completely healed. There is still little bit of an opening in the central portion of the wound there is also some dry skin around the edges that I think we do need to be removed by me to perform some debridement today. Electronic  Signature(s) Signed: 11/09/2022 8:07:47 AM By: [name] Entered By: Worthy Keeler on 11/09/2022 08:07:47 David Jennings ([id number] Page 3 of 8 -------------------------------------------------------------------------------- Physical Exam Details Patient Name: Date of Service: [name], [name] 11/09/2022 7:45 A M Medical Record Number: [id number] Patient Account Number: [id number] Date of Birth/Sex: Treating RN: [birth year] (82 y.o. [name] Primary Care Provider: [name] Other Clinician: Referring Provider: Treating Provider/Extender: [name] in Treatment: 5 Constitutional Well-nourished and well-hydrated in no acute distress. Respiratory normal breathing without difficulty. Psychiatric this patient is able to make decisions and demonstrates good insight into disease process. Alert and Oriented x 3. pleasant and cooperative. Notes Upon inspection patient's wound bed did require sharp debridement this was minimal but still required removal of skin around the edges of the wound along with slough down to good subcutaneous tissue he tolerated this without complication there was minimal bleeding noted controlled with pressure. Electronic Signature(s) Signed: 11/09/2022 8:08:10 AM By: [name] Entered By: Worthy Keeler on 11/09/2022 08:08:09 -------------------------------------------------------------------------------- Physician Orders Details Patient Name: Date of Service: [name], [name] 11/09/2022 7:45 A M Medical Record Number: [id number] Patient Account Number: [id number] Date of Birth/Sex: Treating RN: 1941/07/10 (82 y.o. 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 - please fax all orders to janci at [facility] at [phone] Return A Bathing/ Shower/ Hygiene May shower with wound dressing protected with water repellent cover or cast protector. - Cast protector from [employer] or local pharmacy. Edema Control - Lymphedema / Segmental Compressive Device / Other Elevate, Exercise Daily and A void Standing for Long Periods of Time. Elevate legs to the level of the heart and pump ankles as often as possible Elevate leg(s) parallel to the floor when sitting. Wound Treatment Wound #1 - Lower Leg Wound Laterality: Right, Anterior Peri-Wound Care: AandD Ointment 1 x Per Day/30 Days Discharge Instructions: Apply AandD Ointment around wound to help with sticking Prim Dressing: Hydrofera Blue Ready Transfer Foam, 4x5 (in/in) 1 x Per Day/30 Days ary Discharge Instructions: Apply Hydrofera Blue Ready to wound bed as directed Secondary Dressing: Zetuvit Plus 4x4 (in/in) 1 x Per Day/30 Days Compression Wrap: 3-LAYER WRAP - Profore Lite LF 3 Multilayer Compression Bandaging System 1 x Per Day/30 Days Discharge Instructions: Apply 3 multi-layer wrap as prescribed. Electronic Signature(s) Unsigned Entered By: Rosalio Loud on 11/09/2022 08:05:38 -------------------------------------------------------------------------------- Problem List Details Patient Name: Date of Service: [name], DO NA LD  J. 11/09/2022 7:45 A M Medical Record Number: [id number] Patient Account Number: [id number] Date of Birth/Sex: Treating RN: 10-15-1940 (82 y.o. [name] Primary Care Provider: [name] Other Clinician: Referring Provider: Treating Provider/Extender: [name] in Treatment: 5 Active Problems ICD-10 Encounter Code Description Active Date MDM Diagnosis I87.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 of right 10/04/2022 No Yes lower extremity L97.812 Non-pressure chronic ulcer of other part of right lower leg with fat layer 10/04/2022 No Yes exposed Golva (primary) hypertension 10/04/2022 No Yes I25.10 Atherosclerotic heart disease of native coronary artery without angina pectoris 10/04/2022 No Yes David Jennings, David Jennings ([id number]) [id number].pdf Page 5 of 8 I67.89 Other cerebrovascular disease 10/04/2022 No Yes Z79.01 Long term (current) use of anticoagulants 10/04/2022 No Yes I48.0 Paroxysmal atrial fibrillation 10/04/2022 No Yes Inactive Problems Resolved Problems Electronic Signature(s) Signed: 11/09/2022 7:59:51 AM By: [name] Entered By: Worthy Keeler on 11/09/2022 07:59:50 -------------------------------------------------------------------------------- Progress Note Details Patient Name: Date of Service: [name], [name] 11/09/2022 7:45 A M Medical Record Number: [id number] Patient Account Number: [id number] Date of Birth/Sex: Treating RN: [birth year] (82 y.o. [name] Primary Care Provider: [name] Other Clinician: Referring Provider: Treating Provider/Extender: [name] in Treatment: 5 Subjective Chief Complaint Information obtained from Patient Right LE Ulcer History of Present Illness (HPI) 10-04-2022 upon evaluation today patient presents for a wound of his right anterior lower leg which is just below the knee and began on 09-28-2022.  This was initially a strike to the leg which subsequently ended up with what appears to be a hematoma and the patient ended up of course with the open wound that we currently see. He does have long-term use of anticoagulant therapy he is specifically on I believe the Eliquis. He also has atrial fibrillation which is why he is on the blood thinners and likely what led to the wound currently. With that being said he is not currently on any antibiotics. Patient does have a history of cerebrovascular disease, atrial fibrillation, coronary artery disease, and hypertension. 10-12-2022 upon evaluation today patient appears to be doing well currently in regard to his wound on the leg. He is showing  signs of good improvement. Fortunately there does not appear to be any signs of infection he again is on a blood thinner therefore we are using Xeroform gauze to loosen up a lot of necrotic tissue here and that seems to have done a good job. He does have some sharp debridement necessary today and I think we can do that without causing any significant bleeding which is good news the more that we get off the better and I think we can keep this moving in the right direction. 10-19-2022 upon evaluation today patient actually is showing signs of improvement this is good news. I do think the wound is measuring smaller but it does seem to be trapping a lot of fluid underneath. For that reason I would like to see about going ahead and switch him to a Hydrofera Blue dressing to see if this could be beneficial is also very friable and a little bit hyper granulated due to the excessive moisture. I think that the silver cell may be trapping some fluid here. 10-26-2022 upon evaluation today patient appears to be doing better currently in regard to the wound on the anterior portion of his leg. He has been tolerating the dressing changes without complication and overall I am extremely pleased with where we stand today. I do not see  any signs of infection at this time. 11-02-2022 upon evaluation today patient appears to be doing excellent in regard to his wounds. In fact his leg ulcer is showing signs of excellent improvement. Fortunately I do not see any signs of infection locally nor systemically which is great news and overall I am extremely pleased with where we stand at this point. 11-09-2022 upon evaluation today patient actually appears to be making some excellent progress here. I am actually very pleased with where things stand and I think he is making also believes that strides towards getting this wound completely healed. There is still little bit of an opening in the central portion of the David Jennings, David Jennings ([id number]) [id number] Page 6 of 8 wound there is also some dry skin around the edges that I think we do need to be removed by me to perform some debridement today. Objective Constitutional Well-nourished and well-hydrated in no acute distress. Vitals Time Taken: 7:46 AM, Height: 69 in, Weight: 248 lbs, BMI: 36.6, Temperature: 97.7 F, Pulse: 76 bpm, Respiratory Rate: 16 breaths/min, Blood Pressure: 128/86 mmHg. Respiratory normal breathing without difficulty. Psychiatric this patient is able to make decisions and demonstrates good insight into disease process. Alert and Oriented x 3. pleasant and cooperative. General Notes: Upon inspection patient's wound bed did require sharp debridement this was minimal but still required removal of skin around the edges of the wound along with slough down to good subcutaneous tissue he tolerated this without complication there was minimal bleeding noted controlled with pressure. Integumentary (Hair, Skin) Wound #1 status is Open. Original cause of wound was Skin T ear/Laceration. The date acquired was: 09/28/2022. The wound has been in treatment 5 weeks. The wound is located on the Right,Anterior Lower Leg. The wound measures 0.7cm length x 0.6cm  width x 0.1cm depth; 0.33cm^2 area and 0.033cm^3 volume. There is Fat Layer (Subcutaneous Tissue) exposed. There is a medium amount of serosanguineous drainage noted. The wound margin is flat and intact. There is large (67-100%) red, hyper - granulation within the wound bed. There is no necrotic tissue within the wound bed. Assessment Active Problems ICD-10 Chronic venous hypertension (idiopathic) with ulcer and inflammation of right  lower extremity Non-pressure chronic ulcer of other part of right lower leg with fat layer exposed Essential (primary) hypertension Atherosclerotic heart disease of native coronary artery without angina pectoris Other cerebrovascular disease Long term (current) use of anticoagulants Paroxysmal atrial fibrillation Procedures Wound #1 Pre-procedure diagnosis of Wound #1 is a Venous Leg Ulcer located on the Right,Anterior Lower Leg .Severity of Tissue Pre Debridement is: Fat layer exposed. There was a Excisional Skin/Subcutaneous Tissue Debridement with a total area of 0.42 sq cm performed by Tommie Sams., PA-C. With the following instrument(s): Curette to remove Viable and Non-Viable tissue/material. Material removed includes Subcutaneous Tissue, Slough, Skin: Dermis, and Skin: Epidermis. No specimens were taken.A Minimum amount of bleeding was controlled with Pressure. The procedure was tolerated well. Post Debridement Measurements: 0.7cm length x 0.6cm width x 0.2cm depth; 0.066cm^3 volume. Character of Wound/Ulcer Post Debridement is stable. Severity of Tissue Post Debridement is: Fat layer exposed. Post procedure Diagnosis Wound #1: Same as Pre-Procedure Pre-procedure diagnosis of Wound #1 is a Venous Leg Ulcer located on the Right,Anterior Lower Leg . There was a Three Layer Compression Therapy Procedure by Rosalio Loud, RN. Post procedure Diagnosis Wound #1: Same as Pre-Procedure Plan Follow-up Appointments: Return Appointment in 1 week. - please fax all  orders to janci at [facility] at [phone] Bathing/ Shower/ Hygiene: May shower with wound dressing protected with water repellent cover or cast protector. - Cast protector from [employer] or local pharmacy. Edema Control - Lymphedema / Segmental Compressive Device / Other: Elevate, Exercise Daily and Avoid Standing for Long Periods of Time. Elevate legs to the level of the heart and pump ankles as often as possible Elevate leg(s) parallel to the floor when sitting. David Jennings, David Jennings ([id number]) [id number] Page 7 of 8 WOUND #1: - Lower Leg Wound Laterality: Right, Anterior Peri-Wound Care: AandD Ointment 1 x Per Day/30 Days Discharge Instructions: Apply AandD Ointment around wound to help with sticking Prim Dressing: Hydrofera Blue Ready Transfer Foam, 4x5 (in/in) 1 x Per Day/30 Days ary Discharge Instructions: Apply Hydrofera Blue Ready to wound bed as directed Secondary Dressing: Zetuvit Plus 4x4 (in/in) 1 x Per Day/30 Days Com pression Wrap: 3-LAYER WRAP - Profore Lite LF 3 Multilayer Compression Bandaging System 1 x Per Day/30 Days Discharge Instructions: Apply 3 multi-layer wrap as prescribed. 1. Patient's wound actually is showing signs of excellent improvement I would recommend that we stick with the [facility] that we will use a good amount of AandD ointment around the edges of the wound to keep that Hydrofera Blue from sticking to the wound edges and causing any skin tearing. 2. I am good recommend as well that we stick with the 3 layer compression wrap which I think is doing a great job. 3. Also encouraged the patient to continue to bring his compression socks with him. Within the next 1 to 2 weeks I fully expect him to be discharged completely healed. We will see patient back for reevaluation in 1 week here in the clinic. If anything worsens or changes patient will contact our office for additional recommendations. Electronic Signature(s) Signed:  11/09/2022 8:09:56 AM By: [name] Entered By: Worthy Keeler on 11/09/2022 08:09:55 -------------------------------------------------------------------------------- SuperBill Details Patient Name: Date of Service: [name], [name] 11/09/2022 Medical Record Number: [id number] Patient Account Number: [id number] Date of Birth/Sex: Treating RN: 1941/03/19 (82 y.o. 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 I10 Essential (primary) hypertension I25.10 Atherosclerotic heart disease of native coronary artery without angina pectoris I67.89 Other cerebrovascular disease Z79.01 Long term (current) use of anticoagulants I48.0 Paroxysmal atrial fibrillation Facility Procedures : CPT4 Code: [id number] Description: [facility] - DEB SUBQ TISSUE 20 SQ CM/< ICD-10 Diagnosis Description [id number] Non-pressure chronic ulcer of other part of right lower leg with fat layer exposed Modifier: Quantity: 1 : CPT4 Code: [id number] Description: (Facility Use Only) [id number] - [facility] LWR RT LEG Modifier: Quantity: 1 Physician Procedures : CPT4 Code Description Modifier [id number] - WC PHYS SUBQ TISS 20 SQ CM ICD-10 Diagnosis Description [id number] Non-pressure chronic ulcer of other part of right lower leg with fat layer exposed BRANIGAN, SCHOLES ([id number] Page 8 of 8 Quantity: 1 Electronic Signature(s) Unsigned Previous Signature: 11/09/2022 8:10:16 AM Version By: [name] Entered By: Rosalio Loud on 11/09/2022 08:25:36 Signature(s): Date(s):

## 2022-11-12 NOTE — Progress Notes (Signed)
ORVALL, SHINABARGER ([id number]) [id number] Page 1 of 9 Visit Report for 11/09/2022 Arrival Information Details Patient Name: Date of Service: David [name], [name] 11/09/2022 7:45 David M Medical Record Number: [id number] Patient Account Number: [id number] Date of Birth/Sex: Treating RN: 1941/09/17 (82 y.o. David Jennings Primary Care Korena Nass: [name] Other Clinician: Referring Hugh Kamara: Treating Drayk Humbarger/Extender: [name] in Treatment: 5 Visit Information History Since Last Visit All ordered tests and consults were completed: No Patient Arrived: Ambulatory Added or deleted any medications: No Arrival Time: 07:43 Any new allergies or adverse reactions: No Accompanied By: self Had David fall or experienced change in No Transfer Assistance: None activities of daily living that may affect Patient Identification Verified: Yes risk of falls: Secondary Verification Process Completed: Yes Signs or symptoms of abuse/neglect since last visito No Patient Requires Transmission-Based Precautions: No Hospitalized since last visit: No Patient Has Alerts: Yes Implantable device outside of the clinic excluding No Patient Alerts: Patient on Blood Thinner cellular tissue based products placed in the center since last visit: Has Dressing in Place as Prescribed: Yes Has Compression in Place as Prescribed: Yes Pain Present Now: No Electronic Signature(s) Signed: 11/12/2022 11:05:12 AM By: Rosalio Loud MSN RN CNS WTA Entered By: Rosalio Loud on 11/09/2022 07:46:27 -------------------------------------------------------------------------------- Clinic Level of Care Assessment Details Patient Name: Date of Service: David [name], [name] 11/09/2022 7:45 David M Medical Record Number: [id number] Patient Account Number: [id number] Date of Birth/Sex: Treating RN: 1941-05-14 (82 y.o. David Jennings Primary Care Gomer France: [name] Other  Clinician: Referring Farida Mcreynolds: Treating Latrice Storlie/Extender: [name] in Treatment: 5 Clinic Level of Care Assessment Items TOOL 1 Quantity Score []$  - 0 Use when EandM and Procedure is performed on INITIAL visit ASSESSMENTS - Nursing Assessment / Reassessment []$  - 0 General Physical Exam (combine w/ comprehensive assessment (listed just below) when performed on new pt. [address] [name], [name] ([id number]) [id number] Page 2 of 9 []$  - 0 Comprehensive Assessment (HX, ROS, Risk Assessments, Wounds Hx, etc.) ASSESSMENTS - Wound and Skin Assessment / Reassessment []$  - 0 Dermatologic / Skin Assessment (not related to wound area) ASSESSMENTS - Ostomy and/or Continence Assessment and Care []$  - 0 Incontinence Assessment and Management []$  - 0 Ostomy Care Assessment and Management (repouching, etc.) PROCESS - Coordination of Care []$  - 0 Simple Patient / Family Education for ongoing care []$  - 0 Complex (extensive) Patient / Family Education for ongoing care []$  - 0 Staff obtains Programmer, systems, Records, T Results / Process Orders est []$  - 0 Staff telephones HHA, Nursing Homes / Clarify orders / etc []$  - 0 Routine Transfer to another Facility (non-emergent condition) []$  - 0 Routine Hospital Admission (non-emergent condition) []$  - 0 New Admissions / Biomedical engineer / Ordering NPWT Apligraf, etc. , []$  - 0 Emergency Hospital Admission (emergent condition) PROCESS - Special Needs []$  - 0 Pediatric / Minor Patient Management []$  - 0 Isolation Patient Management []$  - 0 Hearing / Language / Visual special needs []$  - 0 Assessment of Community assistance (transportation, D/C planning, etc.) []$  - 0 Additional assistance / Altered mentation []$  - 0 Support Surface(s) Assessment (bed, cushion, seat, etc.) INTERVENTIONS - Miscellaneous []$  - 0 External ear exam []$  - 0 Patient Transfer (multiple staff / Civil Service fast streamer / Similar devices) []$   - 0 Simple Staple / Suture removal (25 or less) []$  - 0 Complex Staple / Suture removal (26 or more) []$  - 0 Hypo/Hyperglycemic Management (do not check  if billed separately) []$  - 0 Ankle / Brachial Index (ABI) - do not check if billed separately Has the patient been seen at the hospital within the last three years: Yes Total Score: 0 Level Of Care: ____ Electronic Signature(s) Signed: 11/12/2022 11:05:12 AM By: Rosalio Loud MSN RN CNS WTA Entered By: Rosalio Loud on 11/09/2022 08:05:47 -------------------------------------------------------------------------------- Compression Therapy Details Patient Name: Date of Service: David Johns, [name] 11/09/2022 7:45 David M Medical Record Number: [id number] Patient Account Number: [id number] Date of Birth/Sex: Treating RN: Apr 10, 1941 (82 y.o. David Jennings Primary Care Sumedh Shinsato: [name] Other Clinician: MITCH, David Jennings ([id number]) [id number] Page 3 of 9 Referring Izza Bickle: Treating Demani Mcbrien/Extender: [name] in Treatment: 5 Compression Therapy Performed for Wound Assessment: Wound #1 Right,Anterior Lower Leg Performed By: Clinician Rosalio Loud, RN Compression Type: Three Layer Post Procedure Diagnosis Same as Pre-procedure Electronic Signature(s) Signed: 11/12/2022 11:05:12 AM By: Rosalio Loud MSN RN CNS WTA Entered By: Rosalio Loud on 11/09/2022 08:05:03 -------------------------------------------------------------------------------- Encounter Discharge Information Details Patient Name: Date of Service: David Johns, [name] 11/09/2022 7:45 David M Medical Record Number: [id number] Patient Account Number: [id number] Date of Birth/Sex: Treating RN: January 12, 1941 (82 y.o. David Jennings Primary Care Lataunya Ruud: [name] Other Clinician: Referring Damar Petit: Treating Fatiha Guzy/Extender: [name] in Treatment: 5 Encounter Discharge Information Items Post  Procedure Vitals Discharge Condition: Stable Temperature (F): 97.7 Ambulatory Status: Ambulatory Pulse (bpm): 76 Discharge Destination: Home Respiratory Rate (breaths/min): 16 Transportation: Private Auto Blood Pressure (mmHg): 128/86 Accompanied By: self Schedule Follow-up Appointment: Yes Clinical Summary of Care: Electronic Signature(s) Signed: 11/12/2022 11:05:12 AM By: Rosalio Loud MSN RN CNS WTA Entered By: Rosalio Loud on 11/09/2022 08:07:09 -------------------------------------------------------------------------------- Lower Extremity Assessment Details Patient Name: Date of Service: David [name], [name] 11/09/2022 7:45 David M Medical Record Number: [id number] Patient Account Number: [id number] Date of Birth/Sex: Treating RN: 1941-05-13 (82 y.o. David Jennings Primary Care Hendryx Ricke: [name] Other Clinician: Referring Kaj Vasil: Treating Aiman Sonn/Extender: [name] in Treatment: 5 Edema Assessment Left: [Left: Right] [Right: :] Assessed: [Left: No] [Right: No] [Left: Edema] [Right: :] Calf Left: Right: Point of Measurement: 34 cm From Medial Instep 40 cm Ankle Left: Right: Point of Measurement: 12 cm From Medial Instep 30.7 cm Vascular Assessment Pulses: Dorsalis Pedis Palpable: [Right:Yes] Electronic Signature(s) Signed: 11/12/2022 11:05:12 AM By: Rosalio Loud MSN RN CNS WTA Entered By: Rosalio Loud on 11/09/2022 07:56:32 -------------------------------------------------------------------------------- Multi Wound Chart Details Patient Name: Date of Service: David Johns, [name] 11/09/2022 7:45 David M Medical Record Number: [id number] Patient Account Number: [id number] Date of Birth/Sex: Treating RN: 04/13/1941 (82 y.o. David Jennings Primary Care Kynsli Haapala: [name] Other Clinician: Referring Kilee Hedding: Treating Kadien Lineman/Extender: [name] in Treatment: 5 Vital Signs Height(in): 69 Pulse(bpm):  76 Weight(lbs): 248 Blood Pressure(mmHg): 128/86 Body Mass Index(BMI): 36.6 Temperature(F): 97.7 Respiratory Rate(breaths/min): 16 [1:Photos:] [N/David:N/David] Right, Anterior Lower Leg N/David N/David Wound Location: Skin Tear/Laceration N/David N/David Wounding Event: Venous Leg Ulcer N/David N/David Primary Etiology: Asthma, Coronary Artery Disease, N/David N/David Comorbid History: Hypertension 09/28/2022 N/David N/David Date Acquired: 5 N/David N/David Weeks of Treatment: Open N/David N/David Wound Status: No N/David N/David Wound Recurrence: 0.7x0.6x0.1 N/David N/David Measurements L x W x D (cm) 0.33 N/David N/David David (cm) : David Jennings, David Jennings ([id number] Page 5 of 9 0.033 N/David N/David Volume (cm) : 97.00% N/David N/David % Reduction in Area: 98.50% N/David N/David % Reduction in Volume:  Full Thickness Without Exposed N/David N/David Classification: Support Structures Medium N/David N/David Exudate Amount: Serosanguineous N/David N/David Exudate Type: red, brown N/David N/David Exudate Color: Flat and Intact N/David N/David Wound Margin: Large (67-100%) N/David N/David Granulation Amount: Red, Hyper-granulation N/David N/David Granulation Quality: None Present (0%) N/David N/David Necrotic Amount: Fat Layer (Subcutaneous Tissue): Yes N/David N/David Exposed Structures: Fascia: No Tendon: No Muscle: No Joint: No Bone: No None N/David N/David Epithelialization: Treatment Notes Electronic Signature(s) Signed: 11/12/2022 11:05:12 AM By: Rosalio Loud MSN RN CNS WTA Entered By: Rosalio Loud on 11/09/2022 07:56:57 -------------------------------------------------------------------------------- Multi-Disciplinary Care Plan Details Patient Name: Date of Service: David Johns, [name] 11/09/2022 7:45 David M Medical Record Number: [id number] Patient Account Number: [id number] Date of Birth/Sex: Treating RN: 1941-07-24 (82 y.o. David Jennings Primary Care Kester Stimpson: [name] Other Clinician: Referring Lillie Portner: Treating Denelle Capurro/Extender: [name] in  Treatment: 5 Active Inactive Venous Leg Ulcer Nursing Diagnoses: Potential for venous Insuffiency (use before diagnosis confirmed) Goals: Patient will maintain optimal edema control Date Initiated: 10/12/2022 Target Resolution Date: 11/02/2022 Goal Status: Active Patient/caregiver will verbalize understanding of disease process and disease management Date Initiated: 10/12/2022 Target Resolution Date: 11/02/2022 Goal Status: Active Verify adequate tissue perfusion prior to therapeutic compression application Date Initiated: 10/12/2022 Target Resolution Date: 11/02/2022 Goal Status: Active Interventions: Assess peripheral edema status every visit. Compression as ordered Provide education on venous insufficiency Treatment Activities: Therapeutic compression applied : 10/12/2022 David Jennings, David Jennings ([id number]) [phone].pdf Page 6 of 9 Notes: Wound/Skin Impairment Nursing Diagnoses: Impaired tissue integrity Knowledge deficit related to ulceration/compromised skin integrity Goals: Patient/caregiver will verbalize understanding of skin care regimen Date Initiated: 10/12/2022 Target Resolution Date: 10/26/2022 Goal Status: Active Ulcer/skin breakdown will have David volume reduction of 30% by week 4 Date Initiated: 10/12/2022 Target Resolution Date: 11/02/2022 Goal Status: Active Interventions: Assess patient/caregiver ability to perform ulcer/skin care regimen upon admission and as needed Provide education on ulcer and skin care Notes: Electronic Signature(s) Signed: 11/12/2022 11:05:12 AM By: Rosalio Loud MSN RN CNS WTA Entered By: Rosalio Loud on 11/09/2022 08:06:12 -------------------------------------------------------------------------------- Pain Assessment Details Patient Name: Date of Service: David Johns, [name] 11/09/2022 7:45 David M Medical Record Number: [id number] Patient Account Number: [id number] Date of Birth/Sex: Treating RN: [birth year] (82 y.o. David Jennings Primary Care Janie Capp: [name] Other Clinician: Referring Ashleah Valtierra: Treating Janesia Joswick/Extender: [name] in Treatment: 5 Active Problems Location of Pain Severity and Description of Pain Patient Has Paino No Site Locations Pain Management and Medication Current Pain Management: David Jennings, David Jennings ([id number]) [phone].pdf Page 7 of 9 Electronic Signature(s) Signed: 11/12/2022 11:05:12 AM By: Rosalio Loud MSN RN CNS WTA Entered By: Rosalio Loud on 11/09/2022 07:48:43 -------------------------------------------------------------------------------- Patient/Caregiver Education Details Patient Name: Date of Service: David Johns, [name] 2/9/2024andnbsp7:45 David M Medical Record Number: [id number] Patient Account Number: [id number] Date of Birth/Gender: Treating RN: Nov 19, 1940 (82 y.o. David Jennings Primary Care Physician: [name] Other Clinician: Referring Physician: Treating Physician/Extender: [name] in Treatment: 5 Education Assessment Education Provided To: Patient Education Topics Provided Wound Debridement: Handouts: Wound Debridement Methods: Explain/Verbal Responses: State content correctly Electronic Signature(s) Signed: 11/12/2022 11:05:12 AM By: Rosalio Loud MSN RN CNS WTA Entered By: Rosalio Loud on 11/09/2022 08:06:06 -------------------------------------------------------------------------------- Wound Assessment Details Patient Name: Date of Service: David Johns, [name] 11/09/2022 7:45 David M Medical Record Number: [id number] Patient Account Number: [id number] Date of Birth/Sex: Treating RN: [birth year] (82 y.o. David Jennings Primary  Care Shailynn Fong: [name] Other Clinician: Referring Izick Gasbarro: Treating Amahri Dengel/Extender: [name] in Treatment: 5 Wound Status Wound Number: 1 Primary Etiology: Venous Leg Ulcer Wound Location:  Right, Anterior Lower Leg Wound Status: Open Wounding Event: Skin Tear/Laceration Comorbid History: Asthma, Coronary Artery Disease, Hypertension Date Acquired: 09/28/2022 Weeks Of Treatment: 5 Clustered Wound: No David Jennings, David Jennings ([id number] Page 8 of 9 Photos Wound Measurements Length: (cm) 0.7 Width: (cm) 0.6 Depth: (cm) 0.1 Area: (cm) 0.33 Volume: (cm) 0.033 % Reduction in Area: 97% % Reduction in Volume: 98.5% Epithelialization: None Wound Description Classification: Full Thickness Without Exposed Suppor Wound Margin: Flat and Intact Exudate Amount: Medium Exudate Type: Serosanguineous Exudate Color: red, brown t Structures Foul Odor After Cleansing: No Slough/Fibrino Yes Wound Bed Granulation Amount: Large (67-100%) Exposed Structure Granulation Quality: Red, Hyper-granulation Fascia Exposed: No Necrotic Amount: None Present (0%) Fat Layer (Subcutaneous Tissue) Exposed: Yes Tendon Exposed: No Muscle Exposed: No Joint Exposed: No Bone Exposed: No Treatment Notes Wound #1 (Lower Leg) Wound Laterality: Right, Anterior Cleanser Peri-Wound Care AandD Ointment Discharge Instruction: Apply AandD Ointment around wound to help with sticking Topical Primary Dressing Hydrofera Blue Ready Transfer Foam, 4x5 (in/in) Discharge Instruction: Apply Hydrofera Blue Ready to wound bed as directed Secondary Dressing Zetuvit Plus 4x4 (in/in) Secured With Compression Wrap 3-LAYER WRAP - Profore Lite LF 3 Multilayer Compression Bandaging System Discharge Instruction: Apply 3 multi-layer wrap as prescribed. Compression Stockings Add-Ons Electronic Signature(s) Signed: 11/12/2022 11:05:12 AM By: Rosalio Loud MSN RN CNS WTA Entered By: Rosalio Loud on 11/09/2022 07:55:31 David Jennings, David Jennings ([id number] Page 9 of 9 -------------------------------------------------------------------------------- Vitals  Details Patient Name: Date of Service: David [name], [name] 11/09/2022 7:45 David M Medical Record Number: [id number] Patient Account Number: [id number] Date of Birth/Sex: Treating RN: 1941-09-18 (82 y.o. David Jennings Primary Care Cheston Coury: [name] Other Clinician: Referring Kyen Taite: Treating Jerren Flinchbaugh/Extender: [name] in Treatment: 5 Vital Signs Time Taken: 07:46 Temperature (F): 97.7 Height (in): 69 Pulse (bpm): 76 Weight (lbs): 248 Respiratory Rate (breaths/min): 16 Body Mass Index (BMI): 36.6 Blood Pressure (mmHg): 128/86 Reference Range: 80 - 120 mg / dl Electronic Signature(s) Signed: 11/12/2022 11:05:12 AM By: Rosalio Loud MSN RN CNS WTA Entered By: Rosalio Loud on 11/09/2022 07:48:38

## 2022-11-16 ENCOUNTER — Encounter: Payer: Medicare PPO | Admitting: Physician Assistant

## 2022-11-16 DIAGNOSIS — L97812 Non-pressure chronic ulcer of other part of right lower leg with fat layer exposed: Secondary | ICD-10-CM | POA: Diagnosis not present

## 2022-11-17 NOTE — Progress Notes (Signed)
David Jennings ([id number]) [id number] Page 1 of 6 Visit Report for 11/16/2022 Chief Complaint Document Details Patient Name: Date of Service: David [name], [name] 11/16/2022 7:30 David M Medical Record Number: [id number] Patient Account Number: [id number] Date of Birth/Sex: Treating RN: 02/24/1941 (82 y.o. David Jennings Primary Care Provider: [name] Other Clinician: Massie Kluver Referring Provider: Treating Provider/Extender: [name] in Treatment: 6 Information Obtained from: Patient Chief Complaint Right LE Ulcer Electronic Signature(s) Signed: 11/16/2022 7:58:07 AM By: [name] Entered By: Worthy Keeler on 11/16/2022 07:58:07 -------------------------------------------------------------------------------- HPI Details Patient Name: Date of Service: David [name], [name] 11/16/2022 7:30 David M Medical Record Number: [id number] Patient Account Number: [id number] Date of Birth/Sex: Treating RN: 1941-01-04 (82 y.o. David Jennings Primary Care Provider: [name] Other Clinician: Massie Kluver Referring Provider: Treating Provider/Extender: [name] in Treatment: 6 History of Present Illness HPI Description: 10-04-2022 upon evaluation today patient presents for David wound of his right anterior lower leg which is just below the knee and began on 12-29- 2023. This was initially David strike to the leg which subsequently ended up with what appears to be David hematoma and the patient ended up of course with the open wound that we currently see. He does have long-term use of anticoagulant therapy he is specifically on I believe the Eliquis. He also has atrial fibrillation which is why he is on the blood thinners and likely what led to the wound currently. With that being said he is not currently on any antibiotics. Patient does have David history of cerebrovascular disease, atrial fibrillation,  coronary artery disease, and hypertension. 10-12-2022 upon evaluation today patient appears to be doing well currently in regard to his wound on the leg. He is showing signs of good improvement. Fortunately there does not appear to be any signs of infection he again is on David blood thinner therefore we are using Xeroform gauze to loosen up David lot of necrotic tissue here and that seems to have done David good job. He does have some sharp debridement necessary today and I think we can do that without causing any significant bleeding which is good news the more that we get off the better and I think we can keep this moving in the right direction. 10-19-2022 upon evaluation today patient actually is showing signs of improvement this is good news. I do think the wound is measuring smaller but it does seem to be trapping David lot of fluid underneath. For that reason I would like to see about going ahead and switch him to David Hydrofera Blue dressing to see if this could be beneficial is also very friable and David little bit hyper granulated due to the excessive moisture. I think that the silver cell may be trapping some fluid here. David Jennings, David Jennings ([id number]) [id number] Page 2 of 6 10-26-2022 upon evaluation today patient appears to be doing better currently in regard to the wound on the anterior portion of his leg. He has been tolerating the dressing changes without complication and overall I am extremely pleased with where we stand today. I do not see any signs of infection at this time. 11-02-2022 upon evaluation today patient appears to be doing excellent in regard to his wounds. In fact his leg ulcer is showing signs of excellent improvement. Fortunately I do not see any signs of infection locally nor systemically which is great news and overall I am extremely pleased  with where we stand at this point. 11-09-2022 upon evaluation today patient actually appears to be making some excellent  progress here. I am actually very pleased with where things stand and I think he is making also believes that strides towards getting this wound completely healed. There is still little bit of an opening in the central portion of the wound there is also some dry skin around the edges that I think we do need to be removed by me to perform some debridement today. 11-16-2022 upon evaluation today patient actually appears to be doing excellent in fact I think he is completely healed based on what we are seeing at this point. Fortunately there does not appear to be any signs of active infection locally nor systemically which is great news. No fevers, chills, nausea, vomiting, or diarrhea. Electronic Signature(s) Signed: 11/16/2022 8:44:04 AM By: [name] Previous Signature: 11/16/2022 8:43:49 AM Version By: [name] Entered By: Worthy Keeler on 11/16/2022 08:44:04 -------------------------------------------------------------------------------- Physical Exam Details Patient Name: Date of Service: David [name], [name] 11/16/2022 7:30 David M Medical Record Number: [id number] Patient Account Number: [id number] Date of Birth/Sex: Treating RN: [birth year] (82 y.o. David Jennings Primary Care Provider: [name] Other Clinician: Massie Kluver Referring Provider: Treating Provider/Extender: [name] in Treatment: 6 Constitutional Well-nourished and well-hydrated in no acute distress. Respiratory normal breathing without difficulty. Psychiatric this patient is able to make decisions and demonstrates good insight into disease process. Alert and Oriented x 3. pleasant and cooperative. Notes Upon inspection patient show complete epithelization which is great news and overall seems to be doing very well at this time. I do not see any signs of active infection locally nor systemically which is great news. Electronic Signature(s) Signed: 11/16/2022 8:44:15  AM By: [name] Entered By: Worthy Keeler on 11/16/2022 08:44:14 -------------------------------------------------------------------------------- Physician Orders Details Patient Name: Date of Service: David [name], [name] 11/16/2022 7:30 David David Jennings ([id number]) [id number] Page 3 of 6 Medical Record Number: [id number] Patient Account Number: [id number] Date of Birth/Sex: Treating RN: December 21, 1940 (82 y.o. David Jennings, David Jennings Primary Care Provider: [name] Other Clinician: Massie Kluver Referring Provider: Treating Provider/Extender: [name] in Treatment: 6 Verbal / Phone Orders: No Diagnosis Coding ICD-10 Coding Code Description [phone] Chronic venous hypertension (idiopathic) with ulcer and inflammation of right lower extremity L97.812 Non-pressure chronic ulcer of other part of right lower leg with fat layer exposed I10 Essential (primary) hypertension I25.10 Atherosclerotic heart disease of native coronary artery without angina pectoris I67.89 Other cerebrovascular disease Z79.01 Long term (current) use of anticoagulants I48.0 Paroxysmal atrial fibrillation Discharge From [facility] Services Discharge from [facility] Wear compression garments daily. Put garments on first thing when you wake up and remove them before bed. Moisturize legs daily after removing compression garments. - use lotion or AandD ointment at night Elevate, Exercise Daily and David void Standing for Long Periods of Time. Additional Orders / Instructions Other: - cover with David bandaid for the next week or two for protection Electronic Signature(s) Signed: 11/16/2022 1:17:52 PM By: [name] Signed: 11/19/2022 4:23:47 PM By: Massie Kluver Entered By: Massie Kluver on 11/16/2022 08:01:53 -------------------------------------------------------------------------------- Problem List Details Patient Name:  Date of Service: [name], [name] 11/16/2022 7:30 David M Medical Record Number: [id number] Patient Account Number: [id number] Date of Birth/Sex: Treating RN: Jun 20, 1941 (82 y.o. David Jennings Primary  Care Provider: [name] Other Clinician: Massie Kluver Referring Provider: Treating Provider/Extender: [name] in Treatment: 6 Active Problems ICD-10 Encounter Code Description Active Date MDM Diagnosis I87.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 of right 10/04/2022 No Yes lower extremity L97.812 Non-pressure chronic ulcer of other part of right lower leg with fat layer 10/04/2022 No Yes exposed David Jennings (primary) hypertension 10/04/2022 No Yes David Jennings, David Jennings ([id number]) [id number] Page 4 of 6 I25.10 Atherosclerotic heart disease of native coronary artery without angina pectoris 10/04/2022 No Yes I67.89 Other cerebrovascular disease 10/04/2022 No Yes Z79.01 Long term (current) use of anticoagulants 10/04/2022 No Yes I48.0 Paroxysmal atrial fibrillation 10/04/2022 No Yes Inactive Problems Resolved Problems Electronic Signature(s) Signed: 11/16/2022 7:58:01 AM By: [name] Entered By: Worthy Keeler on 11/16/2022 07:58:01 -------------------------------------------------------------------------------- Progress Note Details Patient Name: Date of Service: David [name], [name] 11/16/2022 7:30 David M Medical Record Number: [id number] Patient Account Number: [id number] Date of Birth/Sex: Treating RN: [birth year] (82 y.o. David Jennings Primary Care Provider: [name] Other Clinician: Massie Kluver Referring Provider: Treating Provider/Extender: [name] in Treatment: 6 Subjective Chief Complaint Information obtained from Patient Right LE Ulcer History of Present Illness (HPI) 10-04-2022 upon evaluation today patient presents for David wound of his right  anterior lower leg which is just below the knee and began on 09-28-2022. This was initially David strike to the leg which subsequently ended up with what appears to be David hematoma and the patient ended up of course with the open wound that we currently see. He does have long-term use of anticoagulant therapy he is specifically on I believe the Eliquis. He also has atrial fibrillation which is why he is on the blood thinners and likely what led to the wound currently. With that being said he is not currently on any antibiotics. Patient does have David history of cerebrovascular disease, atrial fibrillation, coronary artery disease, and hypertension. 10-12-2022 upon evaluation today patient appears to be doing well currently in regard to his wound on the leg. He is showing signs of good improvement. Fortunately there does not appear to be any signs of infection he again is on David blood thinner therefore we are using Xeroform gauze to loosen up David lot of necrotic tissue here and that seems to have done David good job. He does have some sharp debridement necessary today and I think we can do that without causing any significant bleeding which is good news the more that we get off the better and I think we can keep this moving in the right direction. 10-19-2022 upon evaluation today patient actually is showing signs of improvement this is good news. I do think the wound is measuring smaller but it does seem to be trapping David lot of fluid underneath. For that reason I would like to see about going ahead and switch him to David Hydrofera Blue dressing to see if this could be beneficial is also very friable and David little bit hyper granulated due to the excessive moisture. I think that the silver cell may be trapping some fluid here. 10-26-2022 upon evaluation today patient appears to be doing better currently in regard to the wound on the anterior portion of his leg. He has been tolerating the dressing changes without complication and  overall I am extremely pleased with where we stand today. I do not see any signs of infection at this time. 11-02-2022 upon evaluation today patient appears  to be doing excellent in regard to his wounds. In fact his leg ulcer is showing signs of excellent improvement. David Jennings, David Jennings ([id number]) [id number] Page 5 of 6 Fortunately I do not see any signs of infection locally nor systemically which is great news and overall I am extremely pleased with where we stand at this point. 11-09-2022 upon evaluation today patient actually appears to be making some excellent progress here. I am actually very pleased with where things stand and I think he is making also believes that strides towards getting this wound completely healed. There is still little bit of an opening in the central portion of the wound there is also some dry skin around the edges that I think we do need to be removed by me to perform some debridement today. 11-16-2022 upon evaluation today patient actually appears to be doing excellent in fact I think he is completely healed based on what we are seeing at this point. Fortunately there does not appear to be any signs of active infection locally nor systemically which is great news. No fevers, chills, nausea, vomiting, or diarrhea. Objective Constitutional Well-nourished and well-hydrated in no acute distress. Vitals Time Taken: 7:46 AM, Height: 69 in, Weight: 248 lbs, BMI: 36.6, Temperature: 97.8 F, Pulse: 59 bpm, Respiratory Rate: 18 breaths/min, Blood Pressure: 142/88 mmHg. Respiratory normal breathing without difficulty. Psychiatric this patient is able to make decisions and demonstrates good insight into disease process. Alert and Oriented x 3. pleasant and cooperative. General Notes: Upon inspection patient show complete epithelization which is great news and overall seems to be doing very well at this time. I do not see any signs of active infection  locally nor systemically which is great news. Integumentary (Hair, Skin) Wound #1 status is Healed - Epithelialized. Original cause of wound was Skin T ear/Laceration. The date acquired was: 09/28/2022. The wound has been in treatment 6 weeks. The wound is located on the Right,Anterior Lower Leg. The wound measures 0cm length x 0cm width x 0cm depth; 0cm^2 area and 0cm^3 volume. There is Fat Layer (Subcutaneous Tissue) exposed. There is David none present amount of drainage noted. The wound margin is flat and intact. There is no granulation within the wound bed. There is no necrotic tissue within the wound bed. 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 Put garments on first thing when you wake up and remove them before bed. Moisturize legs daily after removing compression garments. - use lotion or AandD ointment at night Elevate, Exercise Daily and Avoid Standing for Long Periods of Time. Additional Orders / Instructions: Other: - cover with David bandaid for the next week or two for protection 1. I am recommend that we have him go ahead and switch to his own compression socks which I think should do quite well at this point. He is in agreement with that plan. 2. I am also can recommend that we have the patient continue with David removal of the compression socks at bedtime at which point he can use lotion and/or AandD ointment to help with moisturizing the legs and helping with the dry skin I think this  should help him quite readily. 3. I am also going to suggest that he monitor for any signs of reopening or worsening if anything changes he knows to let me know as soon as possible. We will see the patient back for follow-up visit as needed. David Jennings, David Jennings ([id number]) [id number] Page 6 of 6 Electronic Signature(s) Signed: 11/16/2022 8:47:39 AM By: [name] Entered By: Worthy Keeler on 11/16/2022 08:47:39 -------------------------------------------------------------------------------- SuperBill Details Patient Name: Date of Service: David [name], [name] 11/16/2022 Medical Record Number: [id number] Patient Account Number: [id number] Date of Birth/Sex: Treating RN: [birth year] (82 y.o. 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 I10 Essential (primary) hypertension I25.10 Atherosclerotic heart disease of native coronary artery without angina pectoris I67.89 Other cerebrovascular disease Z79.01 Long term (current) use of anticoagulants I48.0 Paroxysmal atrial fibrillation Facility Procedures : CPT4 Code: [id number] Description: [id number] - WOUND CARE VISIT-LEV 2 EST PT Modifier: Quantity: 1 Physician Procedures : CPT4 Code Description Modifier [id number] - WC PHYS LEVEL 3 - EST PT ICD-10 Diagnosis Description I87.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 of right lower extremity L97.812 Non-pressure chronic ulcer of other part  of right lower leg with fat layer exposed I10 Essential (primary) hypertension I25.10 Atherosclerotic heart  disease of native coronary artery without angina pectoris Quantity: 1 Electronic Signature(s) Signed: 11/16/2022 8:48:04 AM By: [name] Entered By: Worthy Keeler on 11/16/2022 08:48:03

## 2022-11-20 NOTE — Progress Notes (Signed)
David, Jennings ([id number]) [id number] Page 1 of 8 Visit Report for 11/16/2022 Arrival Information Details Patient Name: Date of Service: [name], [name] 11/16/2022 7:30 A M Medical Record Number: [id number] Patient Account Number: [id number] Date of Birth/Sex: Treating RN: 1941-01-22 (82 y.o. David Jennings Primary Care Korey Prashad: [name] Other Clinician: Massie Kluver Referring Keari Miu: Treating Calem Cocozza/Extender: [name] in Treatment: 6 Visit Information History Since Last Visit All ordered tests and consults were completed: No Patient Arrived: Ambulatory Added or deleted any medications: No Arrival Time: 07:41 Any new allergies or adverse reactions: No Transfer Assistance: None Had a fall or experienced change in No Patient Identification Verified: Yes activities of daily living that may affect Secondary Verification Process Completed: Yes risk of falls: Patient Requires Transmission-Based Precautions: No Signs or symptoms of abuse/neglect since last visito No Patient Has Alerts: Yes Hospitalized since last visit: No Patient Alerts: Patient on Blood Thinner Implantable device outside of the clinic excluding No cellular tissue based products placed in the center since last visit: Has Dressing in Place as Prescribed: Yes Has Compression in Place as Prescribed: Yes Pain Present Now: No Electronic Signature(s) Signed: 11/19/2022 4:23:47 PM By: Massie Kluver Entered By: Massie Kluver on 11/16/2022 07:42:26 -------------------------------------------------------------------------------- Clinic Level of Care Assessment Details Patient Name: Date of Service: [name], [name] 11/16/2022 7:30 A M Medical Record Number: [id number] Patient Account Number: [id number] Date of Birth/Sex: Treating RN: 1940-10-13 (82 y.o. David Jennings Primary Care Ludger Bones: [name] Other Clinician: Massie Kluver Referring Ladaija Dimino: Treating Jalei Shibley/Extender: [name] in Treatment: 6 Clinic Level of Care Assessment Items TOOL 4 Quantity Score []$  - 0 Use when only an EandM is performed on FOLLOW-UP visit ASSESSMENTS - Nursing Assessment / Reassessment X- 1 10 Reassessment of Co-morbidities (includes updates in patient status) David Jennings, David Jennings ([id number]) [id number] Page 2 of 8 X- 1 5 Reassessment of Adherence to Treatment Plan ASSESSMENTS - Wound and Skin A ssessment / Reassessment X - Simple Wound Assessment / Reassessment - one wound 1 5 []$  - 0 Complex Wound Assessment / Reassessment - multiple wounds []$  - 0 Dermatologic / Skin Assessment (not related to wound area) ASSESSMENTS - Focused Assessment []$  - 0 Circumferential Edema Measurements - multi extremities []$  - 0 Nutritional Assessment / Counseling / Intervention []$  - 0 Lower Extremity Assessment (monofilament, tuning fork, pulses) []$  - 0 Peripheral Arterial Disease Assessment (using hand held doppler) ASSESSMENTS - Ostomy and/or Continence Assessment and Care []$  - 0 Incontinence Assessment and Management []$  - 0 Ostomy Care Assessment and Management (repouching, etc.) PROCESS - Coordination of Care X - Simple Patient / Family Education for ongoing care 1 15 []$  - 0 Complex (extensive) Patient / Family Education for ongoing care []$  - 0 Staff obtains Programmer, systems, Records, T Results / Process Orders est []$  - 0 Staff telephones HHA, Nursing Homes / Clarify orders / etc []$  - 0 Routine Transfer to another Facility (non-emergent condition) []$  - 0 Routine Hospital Admission (non-emergent condition) []$  - 0 New Admissions / Biomedical engineer / Ordering NPWT Apligraf, etc. , []$  - 0 Emergency Hospital Admission (emergent condition) X- 1 10 Simple Discharge Coordination []$  - 0 Complex (extensive) Discharge Coordination PROCESS - Special Needs []$  - 0 Pediatric /  Minor Patient Management []$  - 0 Isolation Patient Management []$  - 0 Hearing / Language / Visual special needs []$  - 0 Assessment of Community assistance (transportation, D/C planning, etc.) []$  - 0  Additional assistance / Altered mentation []$  - 0 Support Surface(s) Assessment (bed, cushion, seat, etc.) INTERVENTIONS - Wound Cleansing / Measurement X - Simple Wound Cleansing - one wound 1 5 []$  - 0 Complex Wound Cleansing - multiple wounds X- 1 5 Wound Imaging (photographs - any number of wounds) []$  - 0 Wound Tracing (instead of photographs) []$  - 0 Simple Wound Measurement - one wound []$  - 0 Complex Wound Measurement - multiple wounds INTERVENTIONS - Wound Dressings X - Small Wound Dressing one or multiple wounds 1 10 []$  - 0 Medium Wound Dressing one or multiple wounds []$  - 0 Large Wound Dressing one or multiple wounds []$  - 0 Application of Medications - topical []$  - 0 Application of Medications - injection INTERVENTIONS - Miscellaneous David Jennings, David Jennings ([id number]) [id number] Page 3 of 8 []$  - 0 External ear exam []$  - 0 Specimen Collection (cultures, biopsies, blood, body fluids, etc.) []$  - 0 Specimen(s) / Culture(s) sent or taken to Lab for analysis []$  - 0 Patient Transfer (multiple staff / Harrel Lemon Lift / Similar devices) []$  - 0 Simple Staple / Suture removal (25 or less) []$  - 0 Complex Staple / Suture removal (26 or more) []$  - 0 Hypo / Hyperglycemic Management (close monitor of Blood Glucose) []$  - 0 Ankle / Brachial Index (ABI) - do not check if billed separately X- 1 5 Vital Signs Has the patient been seen at the hospital within the last three years: Yes Total Score: 70 Level Of Care: New/Established - Level 2 Electronic Signature(s) Signed: 11/19/2022 4:23:47 PM By: Massie Kluver Entered By: Massie Kluver on 11/16/2022 08:02:25 -------------------------------------------------------------------------------- Encounter Discharge  Information Details Patient Name: Date of Service: David Johns, [name] 11/16/2022 7:30 A M Medical Record Number: [id number] Patient Account Number: [id number] Date of Birth/Sex: Treating RN: 07/15/1941 (82 y.o. David Jennings Primary Care Konica Stankowski: [name] Other Clinician: Massie Kluver Referring Shaniqwa Horsman: Treating Kamille Toomey/Extender: [name] in Treatment: 6 Encounter Discharge Information Items Discharge Condition: Stable Ambulatory Status: Ambulatory Discharge Destination: Home Transportation: Private Auto Accompanied By: self Schedule Follow-up Appointment: Yes Clinical Summary of Care: Electronic Signature(s) Signed: 11/19/2022 4:23:47 PM By: Massie Kluver Entered By: Massie Kluver on 11/16/2022 08:06:30 David Jennings ([id number]) [id number] Page 4 of 8 -------------------------------------------------------------------------------- Lower Extremity Assessment Details Patient Name: Date of Service: [name], [name] 11/16/2022 7:30 A M Medical Record Number: [id number] Patient Account Number: [id number] Date of Birth/Sex: Treating RN: 1941/06/07 (82 y.o. David Jennings, David Jennings Primary Care Ovadia Lopp: [name] Other Clinician: Massie Kluver Referring Jakob Kimberlin: Treating Philbert Ocallaghan/Extender: [name] in Treatment: 6 Edema Assessment Assessed: David Jennings: No] [Right: Yes] Edema: [Left: Ye] [Right: s] Calf Left: Right: Point of Measurement: 34 cm From Medial Instep 40.2 cm Ankle Left: Right: Point of Measurement: 12 cm From Medial Instep 27.5 cm Vascular Assessment Pulses: Dorsalis Pedis Palpable: [Right:Yes] Electronic Signature(s) Signed: 11/16/2022 1:08:07 PM By: David Jennings, BSN, RN, CWS, Kim RN, BSN Signed: 11/19/2022 4:23:47 PM By: Massie Kluver Entered By: Massie Kluver on 11/16/2022 07:55:11 -------------------------------------------------------------------------------- Multi  Wound Chart Details Patient Name: Date of Service: David Johns, [name] 11/16/2022 7:30 A M Medical Record Number: [id number] Patient Account Number: [id number] Date of Birth/Sex: Treating RN: 12/27/1940 (82 y.o. David Jennings Primary Care Justyce Yeater: [name] Other Clinician: Massie Kluver Referring Adaira Centola: Treating Desmen Schoffstall/Extender: [name] in Treatment: 6 Vital Signs Height(in): 69 Pulse(bpm): 59 Weight(lbs): 248 Blood Pressure(mmHg): 142/88 Body Mass Index(BMI): 36.6 Temperature(F):  97.8 Respiratory Rate(breaths/min): 18 [1:Photos:] [N/A:N/A] Right, Anterior Lower Leg N/A N/A Wound Location: Skin T ear/Laceration N/A N/A Wounding Event: Venous Leg Ulcer N/A N/A Primary Etiology: Asthma, Coronary Artery Disease, N/A N/A Comorbid History: Hypertension 09/28/2022 N/A N/A Date Acquired: 6 N/A N/A Weeks of Treatment: Open N/A N/A Wound Status: No N/A N/A Wound Recurrence: 0.1x0.1x0.1 N/A N/A Measurements L x W x D (cm) 0.008 N/A N/A A (cm) : rea 0.001 N/A N/A Volume (cm) : 99.90% N/A N/A % Reduction in Area: 100.00% N/A N/A % Reduction in Volume: Full Thickness Without Exposed N/A N/A Classification: Support Structures Medium N/A N/A Exudate Amount: Serosanguineous N/A N/A Exudate Type: red, brown N/A N/A Exudate Color: Flat and Intact N/A N/A Wound Margin: Large (67-100%) N/A N/A Granulation Amount: Red, Hyper-granulation N/A N/A Granulation Quality: None Present (0%) N/A N/A Necrotic Amount: Fat Layer (Subcutaneous Tissue): Yes N/A N/A Exposed Structures: Fascia: No Tendon: No Muscle: No Joint: No Bone: No None N/A N/A Epithelialization: Treatment Notes Electronic Signature(s) Signed: 11/19/2022 4:23:47 PM By: Massie Kluver Entered By: Massie Kluver on 11/16/2022 07:55:18 -------------------------------------------------------------------------------- Multi-Disciplinary Care Plan Details Patient  Name: Date of Service: David Johns, [name] 11/16/2022 7:30 A M Medical Record Number: [id number] Patient Account Number: [id number] Date of Birth/Sex: Treating RN: Jun 09, 1941 (82 y.o. David Jennings Primary Care Chandlar Staebell: [name] Other Clinician: Massie Kluver Referring Zondra Lawlor: Treating Pranathi Winfree/Extender: [name] in Treatment: 6 Active Inactive Electronic Signature(s) Signed: 11/16/2022 1:08:07 PM By: David Jennings BSN, RN, CWS, Kim RN, BSN Signed: 11/19/2022 4:23:47 PM By: Massie Kluver Entered By: Massie Kluver on 11/16/2022 08:05:59 David Jennings ([id number]) [id number] Page 6 of 8 -------------------------------------------------------------------------------- Pain Assessment Details Patient Name: Date of Service: [name], [name] 11/16/2022 7:30 A M Medical Record Number: [id number] Patient Account Number: [id number] Date of Birth/Sex: Treating RN: Mar 20, 1941 (82 y.o. David Jennings Primary Care Krithi Bray: [name] Other Clinician: Massie Kluver Referring Primrose Oler: Treating Selassie Spatafore/Extender: [name] in Treatment: 6 Active Problems Location of Pain Severity and Description of Pain Patient Has Paino No Site Locations Pain Management and Medication Current Pain Management: Electronic Signature(s) Signed: 11/16/2022 1:08:07 PM By: David Jennings, BSN, RN, CWS, Kim RN, BSN Signed: 11/19/2022 4:23:47 PM By: Massie Kluver Entered By: Massie Kluver on 11/16/2022 07:46:49 -------------------------------------------------------------------------------- Patient/Caregiver Education Details Patient Name: Date of Service: David Johns, [name] 2/16/2024andnbsp7:30 A M Medical Record Number: [id number] Patient Account Number: [id number] Date of Birth/Gender: Treating RN: 07/21/1941 (82 y.o. David Jennings Primary Care Physician: [name] Other Clinician: Massie Kluver Referring  Physician: Treating Physician/Extender: [name] in Treatment: [address] ([id number]) [phone].pdf Page 7 of 8 Education Assessment Education Provided To: Patient Education Topics Provided Wound/Skin Impairment: Handouts: Other: Your wound has healed. Please call if any further issues arise Methods: Explain/Verbal Responses: State content correctly Electronic Signature(s) Signed: 11/19/2022 4:23:47 PM By: Massie Kluver Entered By: Massie Kluver on 11/16/2022 08:03:09 -------------------------------------------------------------------------------- Wound Assessment Details Patient Name: Date of Service: [name], [name] 11/16/2022 7:30 A M Medical Record Number: [id number] Patient Account Number: [id number] Date of Birth/Sex: Treating RN: [birth year] (82 y.o. David Jennings Primary Care Kayn Haymore: [name] Other Clinician: Massie Kluver Referring Omaria Plunk: Treating Terran Klinke/Extender: [name] in Treatment: 6 Wound Status Wound Number: 1 Primary Etiology: Venous Leg Ulcer Wound Location: Right, Anterior Lower Leg Wound Status: Healed - Epithelialized Wounding Event: Skin Tear/Laceration Comorbid History: Asthma, Coronary Artery  Disease, Hypertension Date Acquired: 09/28/2022 Weeks Of Treatment: 6 Clustered Wound: No Photos Wound Measurements Length: (cm) Width: (cm) Depth: (cm) Area: (cm) Volume: (cm) 0 % Reduction in Area: 100% 0 % Reduction in Volume: 100% 0 Epithelialization: Large (67-100%) 0 0 Wound Description Classification: Full Thickness Without Exposed Support Structures Wound Margin: Flat and Intact Exudate Amount: None Present David Jennings, David Jennings ([id number]) Foul Odor After Cleansing: No Slough/Fibrino No [phone].pdf Page 8 of 8 Wound Bed Granulation Amount: None Present (0%) Exposed Structure Necrotic Amount: None Present  (0%) Fascia Exposed: No Fat Layer (Subcutaneous Tissue) Exposed: Yes Tendon Exposed: No Muscle Exposed: No Joint Exposed: No Bone Exposed: No Treatment Notes Wound #1 (Lower Leg) Wound Laterality: Right, Anterior Cleanser Peri-Wound Care Topical Primary Dressing Secondary Dressing Secured With Compression Wrap Compression Stockings Add-Ons Electronic Signature(s) Signed: 11/16/2022 1:08:07 PM By: David Jennings, BSN, RN, CWS, Kim RN, BSN Signed: 11/19/2022 4:23:47 PM By: Massie Kluver Entered By: Massie Kluver on 11/16/2022 08:00:20 -------------------------------------------------------------------------------- Vitals Details Patient Name: Date of Service: David Johns, [name] 11/16/2022 7:30 A M Medical Record Number: [id number] Patient Account Number: [id number] Date of Birth/Sex: Treating RN: 02/17/1941 (82 y.o. David Jennings, David Jennings Primary Care Gabryelle Whitmoyer: [name] Other Clinician: Massie Kluver Referring Yudith Norlander: Treating Machi Whittaker/Extender: [name] in Treatment: 6 Vital Signs Time Taken: 07:46 Temperature (F): 97.8 Height (in): 69 Pulse (bpm): 59 Weight (lbs): 248 Respiratory Rate (breaths/min): 18 Body Mass Index (BMI): 36.6 Blood Pressure (mmHg): 142/88 Reference Range: 80 - 120 mg / dl Electronic Signature(s) Signed: 11/19/2022 4:23:47 PM By: Massie Kluver Entered By: Massie Kluver on 11/16/2022 07:46:43

## 2023-04-29 NOTE — Progress Notes (Signed)
 HPI:  David Jennings is a 82 y.o. 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  The patient was last seen for these symptoms nearly 2 years ago.  At this visit, he received injections into both shoulders which he states provided substantial relief of symptoms but only lasted about a month before his symptoms began to recur.  He denies any pain in either shoulder at rest, but he has more moderate pain with any attempted activity at or above shoulder level, as well as when trying to reach behind his back.  He feels that his left shoulder symptoms are worse than his right.  He also has pain at night if he sleeps on his left or right side.  He has been taking Tylenol  as necessary with limited benefit.  He denies any reinjury to the shoulder since his last visit, but does note occasional numbness and paresthesias to his fingers.  He denies any neck pain.  The patient also is now over 6 years status post open irrigation and debridement of his right ankle following a right total ankle arthroplasty.  The arthroplasty was removed and replaced with a cement block.  The block has remained in place ever since as the patient has declined further treatment for his ankle.  He seems to do reasonably well with his ankle but was advised by his foot and ankle surgeon to have it checked annually.  He admits that he has not had it checked in several years and request that this be done today.  Again, he notes only mild aching discomfort/stiffness in the ankle, although his symptoms will worsen if he does any prolonged standing or ambulation.  However, he finds that if he gets off his leg, elevates it, and applies ice that by the following morning his symptoms are back to baseline.  He denies any reinjury to the ankle and denies any  numbness or paresthesias to his foot.  He also denies any fevers or chills.  Current Outpatient Medications  Medication Sig Dispense Refill  . acetaminophen  (TYLENOL ) 500 MG tablet Take 1,000 mg by mouth as needed    . albuterol  90 mcg/actuation inhaler Inhale 2 inhalations into the lungs every 6 (six) hours as needed for Wheezing 3 each 3  . amLODIPine  (NORVASC ) 5 MG tablet Take 1 tablet (5 mg total) by mouth once daily 90 tablet 1  . atenoloL  (TENORMIN ) 50 MG tablet TAKE 1 TABLET EVERY DAY 90 tablet 3  . atorvastatin  (LIPITOR) 40 MG tablet TAKE 1 TABLET ONE TIME DAILY 90 tablet 3  . clopidogreL  (PLAVIX ) 75 mg tablet TAKE 1 TABLET EVERY DAY 90 tablet 3  . Compound Medication Optase (Intense) Dry Eye Drops    . DOCUSATE CALCIUM  (STOOL SOFTENER ORAL) Take 1 tablet by mouth every other day    . dupilumab (DUPIXENT) 300 mg/2 mL inj syringe 2 mLs (300 mg total) every 14 (fourteen) days    . fluocinonide (LIDEX) 0.05 % cream Apply topically 2 (two) times daily    . fluticasone  propion-salmeteroL (ADVAIR DISKUS) 250-50 mcg/dose diskus inhaler Inhale 1 inhalation into the lungs every 12 (twelve) hours 3 each 1  . fluticasone  propionate (FLONASE ) 50 mcg/actuation nasal spray SHAKE LIQUID AND USE 2 SPRAYS IN EACH NOSTRIL EVERY DAY 48 g 2  . geriatric multivit-iron-mins Tab Take 1 tablet by  mouth once daily    . ipratropium (ATROVENT HFA) inhaler Inhale 1 inhalation into the lungs continuously as needed for Wheezing    . losartan -hydroCHLOROthiazide  (HYZAAR) 100-25 mg tablet TAKE 1 TABLET ONE TIME DAILY 90 tablet 3  . montelukast  (SINGULAIR ) 10 mg tablet TAKE 1 TABLET ONE TIME DAILY 90 tablet 3  . nitroGLYcerin (NITROSTAT) 0.4 MG SL tablet Place 1 tablet (0.4 mg total) under the tongue every 5 (five) minutes as needed for Chest pain May take up to 3 doses. 25 tablet 0  . propylene glycol/peg 400 (SYSTANE ULTRA OPHTH) Place into both eyes once daily    . ipratropium-albuteroL  (DUO-NEB) nebulizer solution  Take 3 mLs by nebulization 4 (four) times daily as needed for Wheezing for up to 360 days 150 mL 0  . oxyBUTYnin  (DITROPAN ) 5 mg tablet Take 1 tablet (5 mg total) by mouth 2 (two) times daily (Patient not taking: Reported on 04/29/2023) 180 tablet 1  . prednisoLONE acetate (PRED FORTE) 1 % ophthalmic suspension Apply to eye (Patient not taking: Reported on 04/16/2023)     No current facility-administered medications for this visit.   Allergies  Allergen Reactions  . Cefazolin  Rash    Tolerated nafcillin 01/07/17  . Adhesive Rash    Band-Aid causes redness   Past Medical History:  Diagnosis Date  . Acute blood loss anemia   . Asthma without status asthmaticus   . Bilateral cataracts   . Blood glucose elevated 05/24/2016  . Cataracts, bilateral   . Chicken pox   . Colon polyp   . Coronary artery disease involving native coronary artery of native heart 10/30/2021  . Dermatitis   . Diverticulosis   . GERD (gastroesophageal reflux disease)   . High cholesterol   . History of blood transfusion 07/2015   Had a Diverticular bleed  . Hypertension   . Kidney stones   . 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 Melanoma (CMS/HHS-HCC)    Status post facial skin removal for malignant melanomas 10/23/10  . Obesity   . Osteoarthritis   . Pneumonia   . Prostate cancer (CMS/HHS-HCC)    s/p seed implants 02/09  . Pulmonary nodules    Bilateral  . Sleep apnea   . Total knee replacement status    bilateral, 2013   Past Surgical History:  Procedure Laterality Date  . VASECTOMY  1960  . SEED IMPLANTS  2009   For prostate cancer  . JOINT REPLACEMENT Right 10/03/2011   Right total knee arthroplasty.  SABRA JOINT REPLACEMENT Left 02/18/2012   Left total knee arthroplasty using computer assisted navigation.  SABRA ARTHROPLASTY TOTAL ANKLE Right 05/25/2016   Procedure: ARTHROPLASTY, ANKLE; WITH IMPLANT (TOTAL ANKLE);  Surgeon: Lynwood DELENA Buerger, MD;  Location: [facility] OR;  Service: Orthopedics;   Laterality: Right;  . INTRAOPERATIVE FLUOROSCOPY N/A 05/25/2016   Procedure: FLUOROSCOPY (SEPARATE PROCEDURE), UP TO 1 HOUR PHYSICIAN OR OTHER QUALIFIED HEALTH CARE PROFESSIONAL TIME, OTHER THAN [id number] OR [id number] (EG, CARDIAC FLUOROSCOPY);  Surgeon: Lynwood DELENA Buerger, MD;  Location: [facility] OR;  Service: Orthopedics;  Laterality: N/A;  . ARTHROPLASTY TOTAL ANKLE Right 05/25/2016   Procedure: ARTHROPLASTY, ANKLE; WITH IMPLANT (TOTAL ANKLE);  Surgeon: Oneil FORBES Bill, MD;  Location: [facility] OR;  Service: Orthopedics;  Laterality: Right;  . REMOVAL HARDWARE ANKLE/FOOT/TOES Right 08/16/2016   Procedure: REMOVAL OF IMPLANT; ANKLE/FOOT/TOE, DEEP (EG, BURIED WIRE, PIN, SCREW, METAL BAND, NAIL, ROD OR PLATE);  Surgeon: Cordella Carole Sleigh, MD;  Location: [facility] OR;  Service: Orthopedics;  Laterality: Right;  . ARTHROTOMY ANKLE Right 08/16/2016   Procedure: ARTHROTOMY, ANKLE, INCLUDING EXPLORATION, DRAINAGE, OR REMOVAL OF FOREIGN BODY;  Surgeon: Cordella Carole Sleigh, MD;  Location: [facility] OR;  Service: Orthopedics;  Laterality: Right;  . INSERTION NON-BIODEGRADABLE DRUG DELIVERY IMPLANT Right 08/16/2016   Procedure: INSERTION, NON-BIODEGRADABLE DRUG DELIVERY IMPLANT;  Surgeon: Cordella Carole Sleigh, MD;  Location: [facility] OR;  Service: Orthopedics;  Laterality: Right;  . DEBRIDEMENT SKIN/SUBCUTANEOUS TISSUE & MUSCLE ANKLE FOOT/TOE Right 01/04/2017   Procedure: DEBRIDEMENT, ANKLE/FOOT/TOE; SUBCUTANEOUS TISSUE (INCLUDES EPIDERMIS AND DERMIS, IF PERFORMED); FIRST 20 SQ CM OR LESS;  Surgeon: Lynwood DELENA Buerger, MD;  Location: [facility] OR;  Service: Orthopedics;  Laterality: Right;  . L2-3 & L3-4 DECOMPRESSION; LEFT L5-S1 FORAMINOTOMY  04/26/2021   Dr. Reeves Daisy at [facility]  . CARDIAC CATHETERIZATION    . CATARACT EXTRACTION     see prior page  . CATARACTS REMOVED     BILATERAL EYES  . COLONOSCOPY    . COLONOSCOPY  6*19/2015 PYO   +TA /DIVERTICULOSIS IN [place] & [place]/REPEAT 12YRS/[place]  . EGD    . Lumbar  spine fusion L4-L5  1980s  . Prostate biopsy    . TONSILLECTOMY    . TUBAL LIGATION  1970  . UPPER GASTROINTESTINAL ENDOSCOPY    . Wisdom Teeth Removed      Family History  Problem Relation Name Age of Onset  . Angina Mother    . Hypotension Father    . Diabetes type II Maternal Grandmother Agnus Wahl   . Asthma Maternal Grandfather Juliene Mo   . Anesthesia problems Neg Hx    . Malignant hyperthermia Neg Hx      Social History   Socioeconomic History  . Marital status: Married  Tobacco Use  . Smoking status: Never  . Smokeless tobacco: Never  . Tobacco comments:    None  Vaping Use  . Vaping status: Never Used  Substance and Sexual Activity  . Alcohol use: Yes    Alcohol/week: 1.0 standard drink of alcohol    Types: 1 Glasses of wine per week    Comment: occasionally  . Drug use: No  . Sexual activity: Not Currently    Partners: Female   Social Determinants of Health   Financial Resource Strain: Low Risk  (02/05/2023)   Overall Financial Resource Strain (CARDIA)   . Difficulty of Paying Living Expenses: Not hard at all  Food Insecurity: No Food Insecurity (02/05/2023)   Hunger Vital Sign   . Worried About Programme researcher, broadcasting/film/video in the Last Year: Never true   . Ran Out of Food in the Last Year: Never true  Transportation Needs: No Transportation Needs (02/05/2023)   PRAPARE - Transportation   . Lack of Transportation (Medical): No   . Lack of Transportation (Non-Medical): No    Review of Systems:  A comprehensive 14 point ROS was performed, reviewed, and the pertinent orthopaedic findings are documented in the HPI.  Exam: Vitals:   04/29/23 0848  BP: 124/84  Weight: (!) 117.1 kg (258 lb 3.2 oz)  Height: 175.3 cm (5' 9)  PainSc: 0-No pain  PainLoc: Shoulder   General/Constitutional: Pleasant overweight elderly male in no acute distress. Neuro/Psych: Normal mood and affect, oriented to person, place and time.  Right shoulder exam: SKIN: Normal SWELLING:  None WARMTH: None LYMPH NODES: No adenopathy palpable CREPITUS: Mild intermittent glenohumeral crepitance TENDERNESS: Mild tenderness over anterolateral shoulder ROM (active):      Forward  flexion: 95 degrees    Abduction: 80 degrees    Internal rotation: Right PSIS ROM (passive):      Forward flexion: 135 degrees    Abduction: 125 degrees        ER/IR at 90 abd: 75 degrees/50 degrees   He appearances moderate pain and intermittent crepitance at the extremes of all motions.   STRENGTH:   Forward flexion: 4/5                         Abduction: 4/5                         External rotation: 4/5                         Internal rotation: 4-4+/5                         Pain with RC testing: Mild pain with resisted forward flexion and abduction   STABILITY: Normal   SPECIAL TESTS:       Vonzell' test: Mildly positive                                     Speed's test: Not evaluated                                     Capsulitis - pain w/ passive ER: No                                     Crossed arm test: Mildly positive                                     Crank: Not evaluated                                     Anterior apprehension: Negative                                     Posterior apprehension: Not evaluated    He remains neurovascularly intact to the right upper extremity and hand.   Left shoulder exam: SKIN: Normal SWELLING: None WARMTH: None LYMPH NODES: No adenopathy palpable CREPITUS: Intermittent glenohumeral crepitance TENDERNESS: Mild tenderness over anterolateral shoulder ROM (active):      Forward flexion: 75 degrees    Abduction: 65 degrees    Internal rotation: Left PSIS ROM (passive):      Forward flexion: 125 degrees    Abduction: 115 degrees        ER/IR at 90 abd: 70 degrees/50 degrees   He experiences moderate pain and intermittent glenohumeral crepitance with all motions.   STRENGTH:   Forward flexion: 4/5                         Abduction:  4/5  External rotation: 4/5                         Internal rotation: 4-4+/5                         Pain with RC testing: Mild pain with resisted forward flexion and abduction   STABILITY: Normal   SPECIAL TESTS:       Vonzell' test: Mildly positive                                     Speed's test: Not evaluated                                     Capsulitis - pain w/ passive ER: No                                     Crossed arm test: Mildly positive                                     Crank: Not evaluated                                     Anterior apprehension: Negative                                     Posterior apprehension: Not evaluated    He remains neurovascularly intact to the left upper extremity and hand.  Right ankle exam: Skin inspection of the right ankle demonstrates well-healed anterior midline surgical incision as well as mild to moderate chronic swelling over the entire ankle region extending into the lower leg and down to the foot.  He has no tenderness to palpation over the anterior, medial, or lateral aspects of the ankle.  The ankle appears to be well aligned clinically.  He is able to actively dorsiflex his ankle to 5 degrees and plantar flex to 15 degrees without pain.  He also has approximately 10 degrees of inversion/eversion excursion.  He is grossly neurovascularly intact to his right foot.  X-rays/MRI/Lab data:  Shoulder X-Ray Imaging: True AP, Y-scapular, and axillary views of the right shoulder are obtained.  These films demonstrate moderate degenerative changes as manifest by appear joint space narrowing and inferior humeral osteophyte formation.  The subacromial space is markedly decreased as the humeral head abuts the underside of the acromion.  There is no subacromial or infra-clavicular spurring.  He exhibits a Type II acromion.   Shoulder X-Ray Imaging: True AP, Y-scapular, and axillary views of the left shoulder are obtained.   These films demonstrate advanced degenerative changes as manifest by joint space narrowing and inferior humeral osteophyte formation.  On the axillary view, there is moderate posterior wear of the glenoid.  The subacromial space is mildly decreased.  There is no subacromial or infra-clavicular spurring.  He exhibits a Type II acromion.  Ankle X-Ray Imaging: AP, lateral, and oblique views of the right ankle are  obtained.  These films demonstrate postsurgical changes as manifest by a large cement bone block interposed between the distal tibia and upper part of the talar body.  The cement appears to be in a satisfactory and stable position.  The ankle itself appears to be well aligned clinically in AP and lateral projections.  There is no surrounding fractures or other acute bony abnormalities.  Assessment: Encounter Diagnoses  Name Primary?  . Acute right ankle pain Yes  . Right shoulder pain, unspecified chronicity   . Left shoulder pain, unspecified chronicity     Plan: The treatment options were discussed with the patient.  In addition, patient educational materials were provided regarding the diagnosis and treatment options.  Regarding his bilateral shoulder issues, the patient would be an excellent candidate for reverse total shoulder replacements in either or both shoulders.  However, at this point, the patient does not wish to consider surgical intervention, especially in light of his recent cardiac issues.  Therefore, I have recommended physical therapy, a home exercise program, and weight loss.  In addition, per his request, he will be referred to Dr. Sharrie for consideration of glenohumeral injections into both shoulders, given that the subacromial injections provided only temporary relief of his symptoms.  The patient may resume his normal daily activities as symptoms permit, but is to avoid offending activities.  He may take over-the-counter medications as needed for  discomfort.  Regarding his right ankle, the patient is assured that the cement block appears to be in good position and stable.  The patient may continue with his normal daily activities, and is encouraged to work on weight loss in order to try to preserve the longevity of his ankle.  He may take over-the-counter medications as needed for discomfort.  All of the patient's questions and concerns were answered.  He can call any time with further concerns.  He will follow up on an as necessary basis per his request.  This office visit took 45 minutes, of which >50% involved patient counseling/education.

## 2023-07-29 ENCOUNTER — Other Ambulatory Visit: Payer: Self-pay | Admitting: Urology

## 2023-07-30 ENCOUNTER — Other Ambulatory Visit: Payer: Self-pay | Admitting: Urology

## 2023-08-09 NOTE — Progress Notes (Signed)
 David Jennings is a 82 y.o. male who is here for a follow up visit  Bladder cancer: He states that he was found to have a recurrence of his bladder cancer. He is scheduled to have removal of this tumor next Friday.  HTN: He is taking amlodipine , atenolol , and losartan /hctz and he tolerates this well. His blood pressure is at goal on this regimen.  Hyperlipidemia: He is takign atorvastatin  and his LDL was 32 on recent labs.  Prediabetes: His A1C was 5.8 on recent labs.  Asthma: He is taking advair daily with albuterol  as needed.   Patient Active Problem List  Diagnosis  . Basal cell carcinoma of face  . 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 Urolith  . Malignant neoplasm of prostate (CMS/HHS-HCC)  . Nocturia  . Essential hypertension, benign  . Asthma (HHS-HCC)  . Other and unspecified hyperlipidemia  . Acute blood loss anemia  . Benign essential hypertension  . Familial multiple lipoprotein-type hyperlipidemia  . Urinary calculus  . Rotator cuff tendinitis, right  . Nontraumatic complete tear of right rotator cuff  . Degenerative joint disease of ankle and foot, right  . Degenerative joint disease of ankle and foot, left  . Idiopathic peripheral neuropathy  . Primary osteoarthritis of right ankle  . Primary osteoarthritis of both feet  . Acquired pes planus of right foot  . Blood glucose elevated, unspecified  . Prosthetic joint infection, subsequent encounter  . Cerebrovascular disease  . Carotid stenosis, asymptomatic, right  . BMI 40.0-44.9, adult (CMS/HHS-HCC)  . History of right ankle joint replacement  . Chronic pain of right ankle  . Arthritis of right subtalar joint  . Primary osteoarthritis of left shoulder  . Rotator cuff tendinitis, left  . Rotator cuff arthropathy, right  . Coronary artery disease involving native coronary artery of native heart  . S/P angioplasty with stent  . Prediabetes    Past Medical History:   Diagnosis Date  . Acute blood loss anemia   . Asthma without status asthmaticus   . Bilateral cataracts   . Blood glucose elevated 05/24/2016  . Cataracts, bilateral   . Chicken pox   . Colon polyp   . Coronary artery disease involving native coronary artery of native heart 10/30/2021  . Dermatitis   . Diverticulosis   . GERD (gastroesophageal reflux disease)   . High cholesterol   . History of blood transfusion 07/2015   Had a Diverticular bleed  . Hypertension   . Kidney stones   . 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 Melanoma (CMS/HHS-HCC)    Status post facial skin removal for malignant melanomas 10/23/10  . Obesity   . Osteoarthritis   . Pneumonia   . Prostate cancer (CMS/HHS-HCC)    s/p seed implants 02/09  . Pulmonary nodules    Bilateral  . Sleep apnea   . Total knee replacement status    bilateral, 2013     Current Outpatient Medications:  .  albuterol  90 mcg/actuation inhaler, Inhale 2 inhalations into the lungs every 6 (six) hours as needed for Wheezing, Disp: 3 each, Rfl: 3 .  amLODIPine  (NORVASC ) 5 MG tablet, take 1 tablet every day, Disp: 90 tablet, Rfl: 1 .  atenoloL  (TENORMIN ) 50 MG tablet, TAKE 1 TABLET EVERY DAY, Disp: 90 tablet, Rfl: 3 .  atorvastatin  (LIPITOR) 40 MG tablet, TAKE 1 TABLET ONE TIME DAILY, Disp: 90 tablet, Rfl: 3 .  clopidogreL  (PLAVIX ) 75 mg tablet,  TAKE 1 TABLET EVERY DAY (Patient taking differently: Take 75 mg by mouth once daily), Disp: 90 tablet, Rfl: 3 .  Compound Medication, Optase (Intense) Dry Eye Drops, Disp: , Rfl:  .  DOCUSATE CALCIUM  (STOOL SOFTENER ORAL), Take 1 tablet by mouth every other day, Disp: , Rfl:  .  dupilumab (DUPIXENT) 300 mg/2 mL inj syringe, 2 mLs (300 mg total) every 14 (fourteen) days, Disp: , Rfl:  .  fluocinonide (LIDEX) 0.05 % cream, Apply topically 2 (two) times daily, Disp: , Rfl:  .  fluticasone  propion-salmeteroL (ADVAIR DISKUS) 250-50 mcg/dose diskus inhaler, Inhale 1 inhalation into the  lungs every 12 (twelve) hours, Disp: 3 each, Rfl: 1 .  fluticasone  propionate (FLONASE ) 50 mcg/actuation nasal spray, SHAKE LIQUID AND USE 2 SPRAYS IN EACH NOSTRIL EVERY DAY, Disp: 48 g, Rfl: 2 .  geriatric multivit-iron-mins Tab, Take 1 tablet by mouth once daily, Disp: , Rfl:  .  ipratropium-albuteroL  (DUO-NEB) nebulizer solution, Take 3 mLs by nebulization 4 (four) times daily as needed for Wheezing for up to 360 days, Disp: 150 mL, Rfl: 0 .  losartan -hydroCHLOROthiazide  (HYZAAR) 100-25 mg tablet, TAKE 1 TABLET ONE TIME DAILY, Disp: 90 tablet, Rfl: 3 .  mirabegron (MYRBETRIQ) 25 mg ER Tablet, Take 25 mg by mouth once daily, Disp: , Rfl:  .  montelukast  (SINGULAIR ) 10 mg tablet, TAKE 1 TABLET ONE TIME DAILY, Disp: 90 tablet, Rfl: 3 .  nitroGLYcerin (NITROSTAT) 0.4 MG SL tablet, Place 1 tablet (0.4 mg total) under the tongue every 5 (five) minutes as needed for Chest pain May take up to 3 doses., Disp: 25 tablet, Rfl: 0 .  acetaminophen  (TYLENOL ) 500 MG tablet, Take 1,000 mg by mouth as needed, Disp: , Rfl:  .  ipratropium (ATROVENT HFA) inhaler, Inhale 1 inhalation into the lungs continuously as needed for Wheezing, Disp: , Rfl:  .  oxyBUTYnin  (DITROPAN ) 5 mg tablet, Take 1 tablet (5 mg total) by mouth 2 (two) times daily (Patient not taking: Reported on 04/29/2023), Disp: 180 tablet, Rfl: 1 .  prednisoLONE acetate (PRED FORTE) 1 % ophthalmic suspension, Apply to eye (Patient not taking: Reported on 04/16/2023), Disp: , Rfl:  .  propylene glycol/peg 400 (SYSTANE ULTRA OPHTH), Place into both eyes once daily, Disp: , Rfl:   Allergies  Allergen Reactions  . Cefazolin  Rash    Tolerated nafcillin 01/07/17  . Adhesive Rash    Band-Aid causes redness    Results for orders placed or performed in visit on 07/31/23  CBC w/auto Differential (5 Part)  Result Value Ref Range   WBC (White Blood Cell Count) 5.9 4.1 - 10.2 10^3/uL   RBC (Red Blood Cell Count) 4.82 4.69 - 6.13 10^6/uL   Hemoglobin 15.7 14.1  - 18.1 gm/dL   Hematocrit 53.7 59.9 - 52.0 %   MCV (Mean Corpuscular Volume) 95.9 80.0 - 100.0 fl   MCH (Mean Corpuscular Hemoglobin) 32.6 (H) 27.0 - 31.2 pg   MCHC (Mean Corpuscular Hemoglobin Concentration) 34.0 32.0 - 36.0 gm/dL   Platelet Count 797 849 - 450 10^3/uL   RDW-CV (Red Cell Distribution Width) 12.7 11.6 - 14.8 %   MPV (Mean Platelet Volume) 9.8 9.4 - 12.4 fl   Neutrophils 3.20 1.50 - 7.80 10^3/uL   Lymphocytes 1.67 1.00 - 3.60 10^3/uL   Monocytes 0.80 0.00 - 1.50 10^3/uL   Eosinophils 0.21 0.00 - 0.55 10^3/uL   Basophils 0.06 0.00 - 0.09 10^3/uL   Neutrophil % 53.9 32.0 - 70.0 %   Lymphocyte % 28.1  10.0 - 50.0 %   Monocyte % 13.5 (H) 4.0 - 13.0 %   Eosinophil % 3.5 1.0 - 5.0 %   Basophil% 1.0 0.0 - 2.0 %   Immature Granulocyte % 0.0 <=0.7 %   Immature Granulocyte Count 0.00 <=0.06 10^3/L  Comprehensive Metabolic Panel (CMP)  Result Value Ref Range   Glucose 99 70 - 110 mg/dL   Sodium 860 863 - 854 mmol/L   Potassium 4.4 3.6 - 5.1 mmol/L   Chloride 102 97 - 109 mmol/L   Carbon Dioxide (CO2) 33.6 (H) 22.0 - 32.0 mmol/L   Urea Nitrogen (BUN) 21 7 - 25 mg/dL   Creatinine 0.7 0.7 - 1.3 mg/dL   Glomerular Filtration Rate (eGFR) 91 >60 mL/min/1.73sq m   Calcium  9.9 8.7 - 10.3 mg/dL   AST  21 8 - 39 U/L   ALT  25 6 - 57 U/L   Alk Phos (alkaline Phosphatase) 88 34 - 104 U/L   Albumin 4.3 3.5 - 4.8 g/dL   Bilirubin, Total 0.9 0.3 - 1.2 mg/dL   Protein, Total 6.7 6.1 - 7.9 g/dL   A/G Ratio 1.8 1.0 - 5.0 gm/dL  Hemoglobin J8R  Result Value Ref Range   Hemoglobin A1C 5.8 (H) 4.2 - 5.6 %   Average Blood Glucose (Calc) 120 mg/dL   Narrative   Normal Range:    4.2 - 5.6% Increased Risk:  5.7 - 6.4% Diabetes:        >= 6.5% Glycemic Control for adults with diabetes:  <7%    Lipid Panel w/calc LDL  Result Value Ref Range   Cholesterol, Total 81 (L) 100 - 200 mg/dL   Triglyceride 895 35 - 199 mg/dL   HDL (High Density Lipoprotein) Cholesterol 28.0 (L) 29.0 - 71.0 mg/dL    LDL Calculated 32 0 - 130 mg/dL   VLDL Cholesterol 21 mg/dL   Cholesterol/HDL Ratio 2.9    *Note: Due to a large number of results and/or encounters for the requested time period, some results have not been displayed. A complete set of results can be found in Results Review.     Review of Systems  No fever, chills, nausea, or vomiting   Exam  Vitals:   08/09/23 0925  BP: 118/84  Pulse: 60  SpO2: 98%  Weight: (!) 112.5 kg (248 lb)  Height: 175.3 cm (5' 9)    Body mass index is 36.62 kg/m.  General: Patient is well-groomed, well-nourished, appears stated age in no acute distress  HEENT: head is atraumatic and normocephalic, neck is supple with no palpable nodules  CV: Regular rhythm and normal heart rate, no murmur, no JVD  Respiratory: Clear to auscultation throughout lung fields with no wheezing, crackles, or rhonchi. No increased work of breathing   Impression/Plan  Asthma: He notes that he is doing well on his current regimen.  Bladder cancer: He is followed by Urology and scheduled for cystoscopy with tumor removal next week.  HTN: Patient is tolerating medicine well and is compliant with medicine. I discussed limiting sodium intake and keeping track of some blood pressure readings. Blood pressure is at goal on current regimen.  Hyperlipidemia: Patient is compliant with medicine and tolerates it well. LDL is at goal on recent labs.  Prediabetes: I discussed limiting carbohydrate intake.   This visit was part of longitudinal care for this patient involving long-term management of chronic medical conditions.

## 2023-08-12 ENCOUNTER — Encounter (HOSPITAL_BASED_OUTPATIENT_CLINIC_OR_DEPARTMENT_OTHER): Payer: Self-pay | Admitting: Urology

## 2023-08-12 ENCOUNTER — Other Ambulatory Visit: Payer: Self-pay

## 2023-08-12 NOTE — Progress Notes (Addendum)
Spoke w/ via phone for pre-op interview---pt Lab needs dos----ekg         Lab results------cmp hemaglobin A1c, cbc with dif 07-31-2023 COVID test -----patient states asymptomatic no test needed Arrive at -------930 08-16-2023 NPO after MN NO Solid Food.  Clear liquids from MN until---830 Med rec completed Medications to take morning of surgery -----advair, albuterol inhaler prn/bring inhaler, myrbetriq Diabetic medication -----n/a Patient instructed no nail polish to be worn day of surgery Patient instructed to bring photo id and insurance card day of surgery Patient aware to have Driver (ride ) / caregiver    for 24 hours after surgery  wife Bosie Clos-  Patient Special Instructions -----bring cpap mask tubing and machine and leave in car Pre-Op special Instructions -----none Patient verbalized understanding of instructions that were given at this phone interview. Patient denies chest pain, sob, fever, cough at the interview.   Note to stop plavix 5 days before surgery dr Dorothyann Peng on chart [facility] cardiology dr Dorothyann Peng 04-16-2023 epic

## 2023-08-15 NOTE — H&P (Signed)
Office Visit Report     07/22/2023   --------------------------------------------------------------------------------   David Arbour. Ghobrial  MRN: [id number]  DOB: [birth year], 82 year old Male  SSN:    PRIMARY CARE:  Berenda Morale, MD  PRIMARY CARE FAX:  [phone]  REFERRING:  David Hick, MD  PROVIDER:  Jettie Pagan, M.D.  LOCATION:  [facility] [phone]     --------------------------------------------------------------------------------   CC/HPI: David Jennings is a 82 year old male who is seen in follow with history of high-grade nonmuscle invasive bladder cancer.   #1. High-grade nonmuscle invasive bladder cancer:  S/p TURBT by Dr. Retta Diones of 2.5 cm right posterior bladder wall tumor with placement of gemcitabine in 03/2022. 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 Muscle was present and uninvolved.  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  -S/p BCG induction 6/6 cycles completed in 06/2022  -Completed BCG maintenance: 227mo (10/2022), 27mo (01/2023)  -Surveillance cystoscopy 05/01/2023 (9 months): NED  07/22/2023 (1 year): 1 segment papillary lesion in the right lateral wall inferior to prior resection site.   Surveillance imaging:  -CT hematuria protocol 07/02/2023: No evidence of urinary tract mass or filling defect.   #2. Localized prostate cancer s/p brachytherapy in 2009 in [place]:  -S/p brachytherapy in 11/2007 by Dr. Molli Jennings in [place].  -Most recent PSA was 0.06 in 12/2022   #3. Urinary urgency: He has noticed this since initiating BCG, he has had worsening urinary urgency that typically improves after some time off from BCG maintenance treatments. He is taking Myrbetriq with improvement.   Patient currently denies fever, chills, sweats, nausea, vomiting, abdominal or flank pain, gross hematuria or dysuria.     ALLERGIES: Latex - Skin Rash, Itching    MEDICATIONS: Aspirin  Gemtesa 75 mg  tablet 1 tablet PO Daily  Lipitor  Oxybutynin Chloride 5 mg tablet tablet PO BID  Plavix 75 mg tablet  Amlodipine Besylate 5 mg tablet  Atenolol 50 mg tablet  Cpap  DUPILUMAB  Fluocinolone Acetonide 0.01 % cream  Fluticasone-Salmeterol 250 mcg-50 mcg/dose blister, with inhalation device  Ipratropium Bromide  Losartan Potassium  Montelukast Sodium 10 mg tablet  Multivitamin  Nitroglycerin 0.4 mg tablet, sublingual  Prednisolone  Stool Softener  Stool Softener     GU PSH: Bladder Instill AntiCA Agent - 02/20/2023, 02/13/2023, 02/06/2023, 10/17/2022, 10/10/2022, 10/03/2022, 06/13/2022, 06/06/2022, 05/30/2022, 05/23/2022, 05/16/2022, 05/02/2022, 04/12/2022, 03/05/2022 Cystoscopy - 05/01/2023, 12/19/2022, 08/22/2022, 01/29/2022 Cystoscopy TURBT 2-5 cm - 04/12/2022, 03/05/2022 Locm 300-399Mg /Ml Iodine,1Ml - 07/02/2023, 02/05/2022 Prostate Needle Biopsy Vasectomy       PSH Notes: ankle replacement- left   NON-GU PSH: Cardiac Stent Placement Cataract Surgery.. Colonoscopy Extensive Ankle/heel Surgery - 2017 Eye Surgery Procedure, Bilateral Heart Stents Lumbar Laminectomy     GU PMH: Bladder Cancer Posterior - 07/02/2023, - 05/01/2023, - 02/20/2023, - 02/13/2023, - 10/17/2022, - 10/10/2022, - 10/03/2022, - 06/13/2022, - 05/30/2022, - 05/23/2022, - 05/16/2022, - 05/02/2022, High-grade nonmuscle invasive bladder cancer, status post initial resection with gemcitabine just recently., - 03/26/2022, 25 mm right trigonal/posterior wall tumor., - 01/29/2022 Prostate Cancer (Stable) - 05/01/2023, Stable/declining PSA curve, no evidence of sequelae from his radiotherapy, - 2019 (Stable), Status post brachytherapy on 11/05/2007. He has had an excellent PSA response without significant sequelae to date., - 2018 (Stable), He is status postbrachytherapy in early 2009. At the time of his biopsy, he was found to have Gleason 3+4 = 7 adenocarcinoma. I do not know what his pretreatment PSA was, but when last checked 1 year  ago was 0.14., -  2017 Urinary Urgency (Stable) - 05/01/2023, Doing well with relatively low-dose Ditropan, - 2020, Overactive bladder symptoms mild/moderate, only on oxybutynin at night, - 2019 (Stable), - 2018, - 2017 History of bladder cancer - 02/06/2023, No evidence of recurrence based on cystoscopy today, - 12/19/2022, Negative cystoscopy today. He tolerated induction BCG well., - 08/22/2022 History of prostate cancer, 15 years out from brachytherapy, most recent PSA 0.065 in 2020 - 12/19/2022, Years out from curative radical prostatectomy, - 08/22/2022, - 06/06/2022, - 05/23/2022, Years out from brachytherapy with excellent PSA response, - 01/29/2022, No evidence of recurrence after brachytherapy, - 2023, Normal DRE/lower PSA, - 2020 Microscopic hematuria - 02/05/2022, Needs evaluation, - 2023 Microscopic hematuria, Urinalysis is clear today. - 2019, (Improving), Urinalysis is clear today, - 2018, He is status post radiotherapy-he does have an abnormal abnormal amount of red blood cells in his urine. This will need follow-up, - 2017    NON-GU PMH: Arthritis Asthma Hypertension Skin Cancer, History Sleep Apnea Stroke/TIA    FAMILY HISTORY: 1 Daughter - Daughter 1 son - Son Death In The Family Father - Father Death In The Family Mother - Mother   SOCIAL HISTORY: Marital Status: Married Preferred Language: English; Ethnicity: Not Hispanic Or Latino; Race: White Current Smoking Status: Patient has never smoked.  Social Drinker.  Drinks 3 caffeinated drinks per day. Patient's occupation is/was retired.    REVIEW OF SYSTEMS:    GU Review Male:   Patient denies frequent urination, hard to postpone urination, burning/ pain with urination, get up at night to urinate, leakage of urine, stream starts and stops, trouble starting your stream, have to strain to urinate , erection problems, and penile pain.  Gastrointestinal (Upper):   Patient denies nausea, vomiting, and indigestion/ heartburn.  Gastrointestinal (Lower):    Patient denies diarrhea and constipation.  Constitutional:   Patient denies fever, night sweats, weight loss, and fatigue.  Skin:   Patient denies skin rash/ lesion and itching.  Eyes:   Patient denies blurred vision and double vision.  Ears/ Nose/ Throat:   Patient denies sore throat and sinus problems.  Hematologic/Lymphatic:   Patient denies swollen glands and easy bruising.  Cardiovascular:   Patient denies leg swelling and chest pains.  Respiratory:   Patient denies cough and shortness of breath.  Endocrine:   Patient denies excessive thirst.  Musculoskeletal:   Patient denies back pain and joint pain.  Neurological:   Patient denies headaches and dizziness.  Psychologic:   Patient denies depression and anxiety.   VITAL SIGNS: None   MULTI-SYSTEM PHYSICAL EXAMINATION:    Constitutional: Well-nourished. No physical deformities. Normally developed. Good grooming.  Respiratory: No labored breathing, no use of accessory muscles.   Cardiovascular: Normal temperature, normal extremity pulses, no swelling, no varicosities.  Gastrointestinal: No mass, no tenderness, no rigidity, non obese abdomen.     Complexity of Data:  Source Of History:  Patient, Medical Record Summary  Lab Test Review:   PSA  Records Review:   AUA Symptom Score, Previous Doctor Records, Previous Patient Records  Urine Test Review:   Urinalysis  X-Ray Review: C.T. Hematuria: Reviewed Films. Reviewed Report. Discussed With Patient.     11/11/18 10/31/17 07/27/16  PSA  Total PSA 0.065 ng/mL 0.083 ng/mL 0.11 ng/dl    PROCEDURES:         Flexible Cystoscopy - 52000  Risks, benefits, and some of the potential complications of the procedure were discussed at length with the patient including infection,  bleeding, voiding discomfort, urinary retention, fever, chills, sepsis, and others. All questions were answered. Informed consent was obtained. Antibiotic prophylaxis was given. Sterile technique and intraurethral  analgesia were used.  Meatus:  Normal size. Normal location. Normal condition.  Urethra:  No strictures.  External Sphincter:  Normal.  Verumontanum:  Normal.  Prostate:  Non-obstructing. No hyperplasia.  Bladder Neck:  Non-obstructing.  Ureteral Orifices:  Normal location. Normal size. Normal shape. Effluxed clear urine.  Bladder:  No trabeculation. 1 cm papillary lesion inferior to the prior resection site.      The lower urinary tract was carefully examined. The procedure was well-tolerated and without complications. Antibiotic instructions were given. Instructions were given to call the office immediately for bloody urine, difficulty urinating, urinary retention, painful or frequent urination, fever, chills, nausea, vomiting or other illness. The patient stated that he understood these instructions and would comply with them.         Visit Complexity - G2211          Urinalysis Dipstick Dipstick Cont'd  Color: Yellow Bilirubin: Neg mg/dL  Appearance: Clear Ketones: Neg mg/dL  Specific Gravity: 8.469 Blood: Neg ery/uL  pH: 5.5 Protein: Neg mg/dL  Glucose: Neg mg/dL Urobilinogen: 0.2 mg/dL    Nitrites: Neg    Leukocyte Esterase: Neg leu/uL    ASSESSMENT:      ICD-10 Details  1 GU:   Bladder Cancer Posterior - C67.4   2   Prostate Cancer - C61   3   Urinary Urgency - R39.15    PLAN:           Schedule Return Visit/Planned Activity: 3 Months - Cystoscopy          Document Letter(s):  Created for Patient: Clinical Summary         Notes:    #1. High-grade nonmuscle invasive bladder cancer:  S/p TURBT by Dr. Retta Diones of 2.5 cm right posterior bladder wall tumor with placement of gemcitabine in 03/2022. 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 Muscle was present and uninvolved.  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  -S/p BCG induction 6/6 cycles completed in 06/2022  -Will arrange for next BCG maintenance.  -I  reviewed imaging today with no evidence of urinary tract mass or filling defect. He has no lymphadenopathy or metastatic disease in his abdomen. He has an unchanged fluid attenuation cystic lesion in the pancreas measuring 3.5 x 3.2 cm with no solid component or suspicious enhancement consistent with a pseudocyst. Given established stability and advanced patient age, this is almost certainly benign and requires no specific further follow-up.  -Cystoscopy today with 1 cm papillary lesion on the right lateral wall inferior to the prior resection site. Recommend proceeding the operating room for TURBT, instillation of gemcitabine. Discussed risk and benefits. Surgery letter submitted.   #2. Localized prostate cancer s/p brachytherapy in 2009 in [place]:  -S/p brachytherapy in 11/2007 by Dr. Molli Jennings in [place].  -Most recent PSA was 0.06 in 12/2022  -Recommend annual PSA.   3. Urinary urgency: Continue Myrbetriq.   Urology Preoperative H&P   Chief Complaint: Bladder cancer  History of Present Illness: David Jennings is a 82 y.o. adult with bladder cancer here for TURBT, instillation of gemcitabine.    Past Medical History:  Diagnosis Date   Arthritis    Asthma    Bladder cancer (HCC)    Cancer (HCC) 2010   prostate CA   Coronary artery disease    COVID-19  Eczema    Hypertension    Sleep apnea    uses cpap   Stroke [facility]) 2019    Past Surgical History:  Procedure Laterality Date   ANKLE SURGERY Right 2020   spacer  applied   BACK SURGERY  1960   COLONOSCOPY     LUMBAR LAMINECTOMY/DECOMPRESSION MICRODISCECTOMY N/A 04/26/2021   Procedure: L2-3 & L3-4 DECOMPRESSION; LEFT L5-S1 FORAMINOTOMY;  Surgeon: Venetia Night, MD;  Location: [facility] ORS;  Service: Neurosurgery;  Laterality: N/A;   MOHS SURGERY     basil cell x 2   PROSTATE SURGERY  2012   seed implant   REPLACEMENT TOTAL KNEE BILATERAL Bilateral 2018   2017   RIGHT/LEFT HEART CATH AND CORONARY ANGIOGRAPHY N/A  10/05/2021   Procedure: RIGHT/LEFT HEART CATH AND CORONARY ANGIOGRAPHY;  Surgeon: Alwyn Pea, MD;  Location: [facility] INVASIVE CV LAB;  Service: Cardiovascular;  Laterality: N/A;   TONSILLECTOMY     as a child   TRANSURETHRAL RESECTION OF BLADDER TUMOR WITH MITOMYCIN-C N/A 03/05/2022   Procedure: TRANSURETHRAL RESECTION OF BLADDER TUMOR WITH GEMCITABINE;  Surgeon: Marcine Matar, MD;  Location: [facility] ORS;  Service: Urology;  Laterality: N/A;   TRANSURETHRAL RESECTION OF BLADDER TUMOR WITH MITOMYCIN-C N/A 04/12/2022   Procedure: REPEAT TRANSURETHRAL RESECTION OF BLADDER TUMOR WITH GEMCITABINE;  Surgeon: Marcine Matar, MD;  Location: [facility];  Service: Urology;  Laterality: N/A;    Allergies:  Allergies  Allergen Reactions   Cefazolin Rash   Tape Rash    Band-Aid causes redness    History reviewed. No pertinent family history.  Social History:  reports that he has never smoked. He has never used smokeless tobacco. He reports current alcohol use. He reports that he does not use drugs.  ROS: A complete review of systems was performed.  All systems are negative except for pertinent findings as noted.  Physical Exam:  Vital signs in last 24 hours:   Constitutional:  Alert and oriented, No acute distress Cardiovascular: Regular rate and rhythm Respiratory: Normal respiratory effort, Lungs clear bilaterally GI: Abdomen is soft, nontender, nondistended, no abdominal masses GU: No CVA tenderness Lymphatic: No lymphadenopathy Neurologic: Grossly intact, no focal deficits Psychiatric: Normal mood and affect  Laboratory Data:  No results for input(s): "WBC", "HGB", "HCT", "PLT" in the last 72 hours.  No results for input(s): "NA", "K", "CL", "GLUCOSE", "BUN", "CALCIUM", "CREATININE" in the last 72 hours.  Invalid input(s): "CO3"   No results found for this or any previous visit (from the past 24 hour(s)). No results found for this or any previous visit  (from the past 240 hour(s)).  Renal Function: No results for input(s): "CREATININE" in the last 168 hours. CrCl cannot be calculated (Patient's most recent lab result is older than the maximum 21 days allowed.).  Radiologic Imaging: No results found.  I independently reviewed the above imaging studies.  Assessment and Plan David Jennings is a 82 y.o. adult with bladder cancer here for TURBT, instillation of gemcitabine.     Matt R. Flem Enderle MD 08/15/2023, 8:42 PM  [facility] Pager: [phone]): [phone]

## 2023-08-16 ENCOUNTER — Ambulatory Visit (HOSPITAL_BASED_OUTPATIENT_CLINIC_OR_DEPARTMENT_OTHER): Payer: Medicare PPO | Admitting: Anesthesiology

## 2023-08-16 ENCOUNTER — Other Ambulatory Visit: Payer: Self-pay

## 2023-08-16 ENCOUNTER — Ambulatory Visit (HOSPITAL_BASED_OUTPATIENT_CLINIC_OR_DEPARTMENT_OTHER)
Admission: RE | Admit: 2023-08-16 | Discharge: 2023-08-16 | Disposition: A | Discharge status: Home / Self Care | Payer: Medicare PPO | Attending: Urology | Admitting: Urology

## 2023-08-16 ENCOUNTER — Encounter (HOSPITAL_BASED_OUTPATIENT_CLINIC_OR_DEPARTMENT_OTHER): Payer: Self-pay | Admitting: Urology

## 2023-08-16 ENCOUNTER — Encounter (HOSPITAL_BASED_OUTPATIENT_CLINIC_OR_DEPARTMENT_OTHER)
Admission: RE | Disposition: A | Discharge status: Home / Self Care | Payer: Self-pay | Source: Home / Self Care | Attending: Urology

## 2023-08-16 DIAGNOSIS — J45909 Unspecified asthma, uncomplicated: Secondary | ICD-10-CM | POA: Insufficient documentation

## 2023-08-16 DIAGNOSIS — G4730 Sleep apnea, unspecified: Secondary | ICD-10-CM | POA: Insufficient documentation

## 2023-08-16 DIAGNOSIS — I2510 Atherosclerotic heart disease of native coronary artery without angina pectoris: Secondary | ICD-10-CM | POA: Diagnosis not present

## 2023-08-16 DIAGNOSIS — C679 Malignant neoplasm of bladder, unspecified: Secondary | ICD-10-CM

## 2023-08-16 DIAGNOSIS — D494 Neoplasm of unspecified behavior of bladder: Secondary | ICD-10-CM

## 2023-08-16 DIAGNOSIS — Z01818 Encounter for other preprocedural examination: Secondary | ICD-10-CM

## 2023-08-16 DIAGNOSIS — I10 Essential (primary) hypertension: Secondary | ICD-10-CM | POA: Diagnosis not present

## 2023-08-16 DIAGNOSIS — C61 Malignant neoplasm of prostate: Secondary | ICD-10-CM | POA: Diagnosis not present

## 2023-08-16 HISTORY — PX: TRANSURETHRAL RESECTION OF BLADDER TUMOR WITH MITOMYCIN-C: SHX6459

## 2023-08-16 HISTORY — DX: Malignant neoplasm of bladder, unspecified: C67.9

## 2023-08-16 LAB — POCT I-STAT, CHEM 8
BUN: 18 mg/dL (ref 8–23)
Calcium, Ion: 1.14 mmol/L — ABNORMAL LOW (ref 1.15–1.40)
Chloride: 102 mmol/L (ref 98–111)
Creatinine, Ser: 0.7 mg/dL (ref 0.61–1.24)
Glucose, Bld: 105 mg/dL — ABNORMAL HIGH (ref 70–99)
HCT: 45 % (ref 39.0–52.0)
Hemoglobin: 15.3 g/dL (ref 13.0–17.0)
Potassium: 3.5 mmol/L (ref 3.5–5.1)
Sodium: 141 mmol/L (ref 135–145)
TCO2: 26 mmol/L (ref 22–32)

## 2023-08-16 SURGERY — TRANSURETHRAL RESECTION OF BLADDER TUMOR WITH MITOMYCIN-C
Anesthesia: General | Site: Bladder

## 2023-08-16 MED ORDER — CIPROFLOXACIN IN D5W 400 MG/200ML IV SOLN
400.0000 mg | Freq: Two times a day (BID) | INTRAVENOUS | Status: DC
  Administered 2023-08-16: 400 mg via INTRAVENOUS

## 2023-08-16 MED ORDER — OXYCODONE HCL 5 MG PO TABS
5.0000 mg | ORAL_TABLET | Freq: Once | ORAL | Status: DC | PRN
Start: 2023-08-16 — End: 2023-08-16

## 2023-08-16 MED ORDER — ACETAMINOPHEN 500 MG PO TABS
1000.0000 mg | ORAL_TABLET | Freq: Once | ORAL | Status: AC
Start: 2023-08-16 — End: 2023-08-16
  Administered 2023-08-16: 1000 mg via ORAL

## 2023-08-16 MED ORDER — ROCURONIUM BROMIDE 10 MG/ML (PF) SYRINGE
PREFILLED_SYRINGE | INTRAVENOUS | Status: AC
  Filled 2023-08-16: qty 10

## 2023-08-16 MED ORDER — 0.9 % SODIUM CHLORIDE (POUR BTL) OPTIME
TOPICAL | Status: DC | PRN
  Administered 2023-08-16: 500 mL

## 2023-08-16 MED ORDER — FENTANYL CITRATE (PF) 100 MCG/2ML IJ SOLN
25.0000 ug | INTRAMUSCULAR | Status: DC | PRN
Start: 2023-08-16 — End: 2023-08-16

## 2023-08-16 MED ORDER — DEXAMETHASONE SODIUM PHOSPHATE 10 MG/ML IJ SOLN
INTRAMUSCULAR | Status: DC | PRN
  Administered 2023-08-16: 5 mg via INTRAVENOUS

## 2023-08-16 MED ORDER — PROPOFOL 10 MG/ML IV BOLUS
INTRAVENOUS | Status: DC | PRN
  Administered 2023-08-16: 100 mg via INTRAVENOUS

## 2023-08-16 MED ORDER — SODIUM CHLORIDE 0.9 % IR SOLN
Status: DC | PRN
  Administered 2023-08-16: 3000 mL via INTRAVESICAL

## 2023-08-16 MED ORDER — FENTANYL CITRATE (PF) 100 MCG/2ML IJ SOLN
INTRAMUSCULAR | Status: DC | PRN
  Administered 2023-08-16: 50 ug via INTRAVENOUS

## 2023-08-16 MED ORDER — GEMCITABINE CHEMO FOR BLADDER INSTILLATION 2000 MG
2000.0000 mg | Freq: Once | INTRAVENOUS | Status: AC
  Administered 2023-08-16: 2000 mg via INTRAVESICAL
  Filled 2023-08-16: qty 2000

## 2023-08-16 MED ORDER — FENTANYL CITRATE (PF) 100 MCG/2ML IJ SOLN
INTRAMUSCULAR | Status: AC
  Filled 2023-08-16: qty 2

## 2023-08-16 MED ORDER — PHENYLEPHRINE 80 MCG/ML (10ML) SYRINGE FOR IV PUSH (FOR BLOOD PRESSURE SUPPORT)
PREFILLED_SYRINGE | INTRAVENOUS | Status: DC | PRN
  Administered 2023-08-16 (×2): 80 ug via INTRAVENOUS

## 2023-08-16 MED ORDER — LIDOCAINE HCL (PF) 2 % IJ SOLN
INTRAMUSCULAR | Status: AC
  Filled 2023-08-16: qty 5

## 2023-08-16 MED ORDER — ONDANSETRON HCL 4 MG/2ML IJ SOLN
INTRAMUSCULAR | Status: AC
  Filled 2023-08-16: qty 2

## 2023-08-16 MED ORDER — LIDOCAINE 2% (20 MG/ML) 5 ML SYRINGE
INTRAMUSCULAR | Status: DC | PRN
  Administered 2023-08-16: 60 mg via INTRAVENOUS

## 2023-08-16 MED ORDER — PROPOFOL 10 MG/ML IV BOLUS
INTRAVENOUS | Status: AC
  Filled 2023-08-16: qty 20

## 2023-08-16 MED ORDER — DEXAMETHASONE SODIUM PHOSPHATE 10 MG/ML IJ SOLN
INTRAMUSCULAR | Status: AC
Start: 2023-08-16 — End: ?
  Filled 2023-08-16: qty 1

## 2023-08-16 MED ORDER — SODIUM CHLORIDE 0.9 % IV SOLN: INTRAVENOUS | Status: DC

## 2023-08-16 MED ORDER — SUGAMMADEX SODIUM 200 MG/2ML IV SOLN
INTRAVENOUS | Status: DC | PRN
  Administered 2023-08-16: 400 mg via INTRAVENOUS

## 2023-08-16 MED ORDER — ONDANSETRON HCL 4 MG/2ML IJ SOLN
4.0000 mg | Freq: Once | INTRAMUSCULAR | Status: DC | PRN
Start: 2023-08-16 — End: 2023-08-16

## 2023-08-16 MED ORDER — CIPROFLOXACIN IN D5W 400 MG/200ML IV SOLN
INTRAVENOUS | Status: AC
  Filled 2023-08-16: qty 200

## 2023-08-16 MED ORDER — OXYCODONE HCL 5 MG/5ML PO SOLN: 5.0000 mg | Freq: Once | ORAL | Status: DC | PRN

## 2023-08-16 MED ORDER — ACETAMINOPHEN 500 MG PO TABS
ORAL_TABLET | ORAL | Status: AC
  Filled 2023-08-16: qty 2

## 2023-08-16 MED ORDER — LACTATED RINGERS IV SOLN: INTRAVENOUS | Status: DC

## 2023-08-16 MED ORDER — ROCURONIUM BROMIDE 10 MG/ML (PF) SYRINGE
PREFILLED_SYRINGE | INTRAVENOUS | Status: DC | PRN
  Administered 2023-08-16: 50 mg via INTRAVENOUS

## 2023-08-16 SURGICAL SUPPLY — 29 items
BAG DRAIN URO-CYSTO SKYTR STRL (DRAIN) ×1 IMPLANT
BAG URINE DRAIN 2000ML AR STRL (UROLOGICAL SUPPLIES) IMPLANT
BAG URINE LEG 500ML (DRAIN) IMPLANT
CATH FOLEY 2WAY 5CC 16FR SIL (CATHETERS) IMPLANT
CATH FOLEY 2WAY SLVR 30CC 20FR (CATHETERS) IMPLANT
CATH FOLEY 2WAY SLVR 5CC 16FR (CATHETERS) IMPLANT
CATH FOLEY 2WAY SLVR 5CC 18FR (CATHETERS) IMPLANT
CATH FOLEY 2WAY SLVR 5CC 22FR (CATHETERS) IMPLANT
CATH SET URETHRAL DILATOR (CATHETERS) IMPLANT
CATH URETL OPEN 5X70 (CATHETERS) IMPLANT
ELECT REM PT RETURN 9FT ADLT (ELECTROSURGICAL)
ELECTRODE REM PT RTRN 9FT ADLT (ELECTROSURGICAL) ×1 IMPLANT
EVACUATOR MICROVAS BLADDER (UROLOGICAL SUPPLIES) IMPLANT
GLOVE BIO SURGEON STRL SZ7 (GLOVE) IMPLANT
GLOVE BIOGEL PI IND STRL 7.0 (GLOVE) IMPLANT
GOWN STRL REUS W/TWL LRG LVL3 (GOWN DISPOSABLE) ×1 IMPLANT
GUIDEWIRE STR DUAL SENSOR (WIRE) IMPLANT
GUIDEWIRE ZIPWRE .038 STRAIGHT (WIRE) IMPLANT
IV NS IRRIG 3000ML ARTHROMATIC (IV SOLUTION) IMPLANT
KIT TURNOVER CYSTO (KITS) ×1 IMPLANT
LOOP CUT BIPOLAR 24F LRG (ELECTROSURGICAL) IMPLANT
MANIFOLD NEPTUNE II (INSTRUMENTS) IMPLANT
NS IRRIG 500ML POUR BTL (IV SOLUTION) IMPLANT
PACK CYSTO (CUSTOM PROCEDURE TRAY) ×1 IMPLANT
PLUG CATH AND CAP STRL 200 (CATHETERS) IMPLANT
SLEEVE SCD COMPRESS KNEE MED (STOCKING) ×1 IMPLANT
SYR TOOMEY IRRIG 70ML (MISCELLANEOUS) ×1
SYRINGE TOOMEY IRRIG 70ML (MISCELLANEOUS) IMPLANT
TUBE CONNECTING 12X1/4 (SUCTIONS) IMPLANT

## 2023-08-16 NOTE — Transfer of Care (Signed)
Immediate Anesthesia Transfer of Care Note  Patient: David Jennings  Procedure(s) Performed: TRANSURETHRAL RESECTION OF BLADDER TUMOR WITH INSTILLATION OF GEMCITABINE (Bladder)  Patient Location: PACU  Anesthesia Type:General  Level of Consciousness: drowsy  Airway & Oxygen Therapy: Patient Spontanous Breathing and Patient connected to nasal cannula oxygen  Post-op Assessment: Report given to RN  Post vital signs: Reviewed and stable  Last Vitals:  Vitals Value Taken Time  BP 143/82   Temp    Pulse 65 08/16/23 1220  Resp 14 08/16/23 1220  SpO2 98 % 08/16/23 1220  Vitals shown include unfiled device data.  Last Pain:  Vitals:   08/16/23 1010  TempSrc: Oral  PainSc: 0-No pain      Patients Stated Pain Goal: 7 (08/16/23 1010)  Complications: No notable events documented.

## 2023-08-16 NOTE — Op Note (Signed)
Operative Note  Preoperative diagnosis:  1.  Urothelial carcinoma of the bladder  Postoperative diagnosis: 1.  Same  Procedure(s): 1.  TURBT small 2. Instillation of gemcitabine 3. Urethral dilation  Surgeon: Jettie Pagan, MD  Assistants:  None  Anesthesia:  General  Complications:  None  EBL:  Minimal  Specimens: 1.  ID Type Source Tests Collected by Time Destination  1 : posterior wall bladder tumor Tissue PATH GU tumor resection SURGICAL PATHOLOGY Jannifer Hick, MD 08/16/2023 1159     Drains/Catheters: 1.  16 French foley  Intraoperative findings:   1cm papillary lesion inferior to prior resection site, excellent resection with excellent hemostasis.  Number of tumors:        1 Size of largest tumor:         1cm  Characteristics of tumors:     Papillary   Recurrent   Yes   Suspicious for Carcinoma in situ:    No  Clinical tumor stage:         cTa  Bimanual exam under anesthesia:        Yes - no 3D mass  Visually complete resection:                 Yes  Visualization of detrusor muscle in resection base:        Yes  Visual evaluation for perforation:             Performed - no evidence of perforation   Indication:  David Jennings is a 82 y.o. adult with a history of HG NMIBC with recurrence here for TURBT. All the risks, benefits were discussed with the patient to include but not limited to infection, pain, bleeding, damage to adjacent structures, need for further operations, adverse reaction to anesthesia and death.  Patient understands these risks and agrees to proceed with the operation as planned.    Description of procedure: After informed consent was obtained from the patient, the patient was taken to the operating room. General anesthesia was administered. The patient was placed in dorsal lithotomy position and prepped and draped in usual sterile fashion. Sequential compression devices were applied to lower extremities at the beginning of the case  for DVT prophylaxis. Antibiotics were infused prior to surgery start time. A surgical time-out was performed to properly identify the patient, the surgery to be performed, and the surgical site.     We then passed the 21-French rigid cystoscope down the urethra and into the bladder under direct vision without any difficulty.  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  0.03 wire was passed in the bladder.  This was then dilated from 21 [place] to 26 [place] without difficulty using [employer] sounds.  The prostate was mildly obstructing. The bladder was inspected with 30 and 70 degree lenses. Once in the bladder, systematic evaluation of bladder revealed 1 cm lesion inferior to the prior resection site on the right lateral wall. The ureteral orfices were in orthotopic position and not involved.   We then removed the cystoscope and then passed down the 26 French resectoscope sheath down the urethra into the bladder under direct vision with the visual obturator. The tumor was resected down to muscle. The TUR bladder tumor chips were retrieved from the bladder and each region of resection was passed off the field as a separate specimen.  Hemostasis was achieved using electrocautery. The patient tolerated the procedure well with no complication and was awoken from anesthesia and taken to  recovery in stable condition.     While in the post-operative care unit, as a separate procedure, 2000 mg of gemcitabine in 50 mL of water was instilled in the bladder through the catheter and the catheter was plugged. This will remain indwelling for approximately one hour. It will then be drained from the bladder and the catheter will be removed and the patient discharged home.  Plan: Discharge home.  He will follow-up with me in 1 week to review pathology results.  Matt R. Jayvyn Haselton MD [facility]  Pager: [phone]

## 2023-08-16 NOTE — Discharge Instructions (Signed)
Activity:  You are encouraged to ambulate frequently (about every hour during waking hours) to help prevent blood clots from forming in your legs or lungs.   ? ?Diet: You should advance your diet as instructed by your physician.  It will be normal to have some bloating, nausea, and abdominal discomfort intermittently. ? ?Prescriptions:  You will be provided a prescription for pain medication to take as needed.  If your pain is not severe enough to require the prescription pain medication, you may take extra strength Tylenol instead which will have less side effects.  You should also take a prescribed stool softener to avoid straining with bowel movements as the prescription pain medication may constipate you. ? ?What to call us about: You should call the office ([phone]) if you develop fever > 101 or develop persistent vomiting. Activity:  You are encouraged to ambulate frequently (about every hour during waking hours) to help prevent blood clots from forming in your legs or lungs.  ?

## 2023-08-16 NOTE — Anesthesia Procedure Notes (Signed)
Procedure Name: Intubation Date/Time: 08/16/2023 11:45 AM  Performed by: Francie Massing, CRNAPre-anesthesia Checklist: Patient identified, Emergency Drugs available, Suction available and Patient being monitored Patient Re-evaluated:Patient Re-evaluated prior to induction Oxygen Delivery Method: Circle system utilized Preoxygenation: Pre-oxygenation with 100% oxygen Induction Type: IV induction Ventilation: Mask ventilation without difficulty Laryngoscope Size: Mac and 4 Grade View: Grade I Tube type: Oral Tube size: 7.5 mm Number of attempts: 1 Airway Equipment and Method: Stylet and Oral airway Placement Confirmation: ETT inserted through vocal cords under direct vision, positive ETCO2 and breath sounds checked- equal and bilateral Secured at: 22 cm Tube secured with: Tape Dental Injury: Teeth and Oropharynx as per pre-operative assessment

## 2023-08-16 NOTE — Anesthesia Postprocedure Evaluation (Signed)
Anesthesia Post Note  Patient: David Jennings  Procedure(s) Performed: TRANSURETHRAL RESECTION OF BLADDER TUMOR WITH INSTILLATION OF GEMCITABINE (Bladder)     Patient location during evaluation: PACU Anesthesia Type: General Level of consciousness: awake and alert Pain management: pain level controlled Vital Signs Assessment: post-procedure vital signs reviewed and stable Respiratory status: spontaneous breathing, nonlabored ventilation and respiratory function stable Cardiovascular status: stable and blood pressure returned to baseline Anesthetic complications: no   No notable events documented.  Last Vitals:  Vitals:   08/16/23 1400 08/16/23 1428  BP:  138/88  Pulse:  60  Resp:  18  Temp: 36.7 C 36.7 C  SpO2:  94%    Last Pain:  Vitals:   08/16/23 1457  TempSrc:   PainSc: 0-No pain                 Beryle Lathe

## 2023-08-16 NOTE — Anesthesia Preprocedure Evaluation (Addendum)
Anesthesia Evaluation  Patient identified by MRN, date of birth, ID band Patient awake    Reviewed: Allergy & Precautions, NPO status , Patient's Chart, lab work & pertinent test results, reviewed documented beta blocker date and time   History of Anesthesia Complications Negative for: history of anesthetic complications  Airway Mallampati: II  TM Distance: >3 FB Neck ROM: Full    Dental  (+) Chipped, Dental Advisory Given   Pulmonary asthma , sleep apnea and Continuous Positive Airway Pressure Ventilation    Pulmonary exam normal        Cardiovascular hypertension, Pt. on medications and Pt. on home beta blockers + CAD and + Cardiac Stents  Normal cardiovascular exam   '23 Cath - Prox RCA lesion is 95% stenosed.   Ost Cx to Dist Cx lesion is 25% stenosed.   Prox LAD to Dist LAD lesion is 25% stenosed.   The left ventricular systolic function is normal.   LV end diastolic pressure is normal.   The left ventricular ejection fraction is 55-65% by visual estimate.   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   High-grade disease mid RCA recommend staged procedure for intervention   Recommend referral to tertiary care center for complex mid RCA    Neuro/Psych CVA, No Residual Symptoms  negative psych ROS   GI/Hepatic negative GI ROS, Neg liver ROS,,,  Endo/Other   Obesity   Renal/GU negative Renal ROS    Bladder cancer     Musculoskeletal  (+) Arthritis ,    Abdominal   Peds  Hematology  On plavix    Anesthesia Other Findings   Reproductive/Obstetrics                             Anesthesia Physical Anesthesia Plan  ASA: 3  Anesthesia Plan: General   Post-op Pain Management: Tylenol PO (pre-op)*   Induction: Intravenous  PONV Risk Score and Plan: 2 and Treatment may vary due to age or medical condition, Ondansetron and Propofol  infusion  Airway Management Planned: Oral ETT  Additional Equipment: None  Intra-op Plan:   Post-operative Plan: Extubation in OR  Informed Consent: I have reviewed the patients History and Physical, chart, labs and discussed the procedure including the risks, benefits and alternatives for the proposed anesthesia with the patient or authorized representative who has indicated his/her understanding and acceptance.     Dental advisory given  Plan Discussed with: CRNA and Anesthesiologist  Anesthesia Plan Comments:         Anesthesia Quick Evaluation

## 2023-08-19 LAB — SURGICAL PATHOLOGY

## 2023-08-20 ENCOUNTER — Encounter (HOSPITAL_BASED_OUTPATIENT_CLINIC_OR_DEPARTMENT_OTHER): Payer: Self-pay | Admitting: Urology

## 2024-05-04 ENCOUNTER — Encounter: Payer: Self-pay | Admitting: Diagnostic Neuroimaging

## 2024-05-04 ENCOUNTER — Ambulatory Visit (INDEPENDENT_AMBULATORY_CARE_PROVIDER_SITE_OTHER): Admitting: Diagnostic Neuroimaging

## 2024-05-04 VITALS — BP 128/83 | HR 60 | Ht 69.0 in | Wt 257.0 lb

## 2024-05-04 DIAGNOSIS — M79641 Pain in right hand: Secondary | ICD-10-CM

## 2024-05-04 DIAGNOSIS — M79642 Pain in left hand: Secondary | ICD-10-CM

## 2024-05-04 NOTE — Patient Instructions (Addendum)
  HAND PAIN, ARTHRITIS (rheumatoid) - per rheumatology; monitor for continued improvement  BILATERAL CARPAL TUNNEL SYNDROME (by clinical exam) - wrist splints at night; monitor for symptoms

## 2024-05-04 NOTE — Progress Notes (Signed)
 [facility]  PATIENT: David Jennings DOB: [birth year]  REFERRING CLINICIAN: Godfrey Area* HISTORY FROM: patient  REASON FOR VISIT: new consult   HISTORICAL  CHIEF COMPLAINT:  Chief Complaint  Patient presents with   Extremity Weakness    Rm 7 alone Pt is well, reports he has been having bilateral hand Rheumatoid arthritis and finger weakness as well as pain in feet. Symptoms have been persistent for over 6 months.     HISTORY OF PRESENT ILLNESS:   83 year old male here for evaluation of painful hands.  Around 6 months (February 2025) ago patient had onset of painful stiff swelling in hands, fingers and joints, which are gradual worsened over time.  Sometimes he wakes up with some numbness in his hands and he has to shake them out.  He went to rheumatology was diagnosed with rheumatoid arthritis.  Now on Plaquenil and methotrexate.  Symptoms slightly improving.  Has long history of arthritis affecting other joints with multiple surgeries in the past.   REVIEW OF SYSTEMS: Full 14 system review of systems performed and negative with exception of: as per HPI.  ALLERGIES: Allergies  Allergen Reactions   Cefazolin  Rash   Tape Rash    Band-Aid causes redness    HOME MEDICATIONS: Outpatient Medications Prior to Visit  Medication Sig Dispense Refill   albuterol  (VENTOLIN  HFA) 108 (90 Base) MCG/ACT inhaler Inhale 2 puffs into the lungs every 6 (six) hours as needed for wheezing.     amLODipine  (NORVASC ) 5 MG tablet Take 5 mg by mouth daily.     aspirin  EC 81 MG tablet Take 81 mg by mouth daily. Swallow whole.     atorvastatin  (LIPITOR) 40 MG tablet Take 1 tablet (40 mg total) by mouth daily. (Patient taking differently: Take 40 mg by mouth every evening.)     fluticasone  (FLONASE ) 50 MCG/ACT nasal spray Place 2 sprays into both nostrils daily as needed for allergies.     fluticasone -salmeterol (ADVAIR) 250-50 MCG/ACT AEPB Inhale 1 puff into the  lungs in the morning and at bedtime.     folic acid (FOLVITE) 1 MG tablet Take 1 mg by mouth daily.     hydroxychloroquine (PLAQUENIL) 200 MG tablet Take 200 mg by mouth 2 (two) times daily.     ipratropium (ATROVENT HFA) 17 MCG/ACT inhaler Inhale 2 puffs into the lungs every 6 (six) hours as needed for wheezing.     ipratropium-albuterol  (DUONEB) 0.5-2.5 (3) MG/3ML SOLN Inhale 3 mLs into the lungs every 6 (six) hours as needed (shortness of breath or wheezing).     losartan -hydrochlorothiazide  (HYZAAR) 100-25 MG tablet Take 1 tablet by mouth daily.     methotrexate (RHEUMATREX) 2.5 MG tablet Take 12.5 mg by mouth.     metoprolol succinate (TOPROL-XL) 25 MG 24 hr tablet Take 25 mg by mouth daily.     mirabegron ER (MYRBETRIQ) 25 MG TB24 tablet Take 25 mg by mouth daily.     montelukast  (SINGULAIR ) 10 MG tablet Take 10 mg by mouth every evening.     Multiple Vitamin (MULTIVITAMIN WITH MINERALS) TABS tablet Take 1 tablet by mouth daily.     nitroGLYCERIN (NITROSTAT) 0.4 MG SL tablet Place 0.4 mg under the tongue every 5 (five) minutes as needed for chest pain.     tetracaine (PONTOCAINE) 0.5 % ophthalmic solution Place 1 drop into both eyes 2 (two) times daily with a meal.     atenolol  (TENORMIN ) 50 MG tablet Take 50 mg by mouth every  evening.     clopidogrel  (PLAVIX ) 75 MG tablet Take 75 mg by mouth daily. Hold 5 days before surgery per urology .     docusate sodium  (COLACE) 100 MG capsule Take 100 mg by mouth every other day.     dupilumab (DUPIXENT) 300 MG/2ML prefilled syringe Inject 300 mg into the skin every 14 (fourteen) days.     fluocinonide cream (LIDEX) 0.05 % Apply 1 application. topically daily as needed (eczema).     No facility-administered medications prior to visit.    PAST MEDICAL HISTORY: Past Medical History:  Diagnosis Date   Arthritis    Asthma    Bladder cancer (HCC)    Cancer (HCC) 2010   prostate CA   Coronary artery disease    COVID-19    Eczema     Hypertension    Sleep apnea    uses cpap   Stroke [facility]) 2019    PAST SURGICAL HISTORY: Past Surgical History:  Procedure Laterality Date   ANKLE SURGERY Right 2020   spacer  applied   BACK SURGERY  1960   COLONOSCOPY     LUMBAR LAMINECTOMY/DECOMPRESSION MICRODISCECTOMY N/A 04/26/2021   Procedure: L2-3 & L3-4 DECOMPRESSION; LEFT L5-S1 FORAMINOTOMY;  Surgeon: Clois Fret, MD;  Location: [facility] ORS;  Service: Neurosurgery;  Laterality: N/A;   MOHS SURGERY     basil cell x 2   PROSTATE SURGERY  2012   seed implant   REPLACEMENT TOTAL KNEE BILATERAL Bilateral 2018   2017   RIGHT/LEFT HEART CATH AND CORONARY ANGIOGRAPHY N/A 10/05/2021   Procedure: RIGHT/LEFT HEART CATH AND CORONARY ANGIOGRAPHY;  Surgeon: Florencio Cara BIRCH, MD;  Location: [facility] INVASIVE CV LAB;  Service: Cardiovascular;  Laterality: N/A;   TONSILLECTOMY     as a child   TRANSURETHRAL RESECTION OF BLADDER TUMOR WITH MITOMYCIN -C N/A 03/05/2022   Procedure: TRANSURETHRAL RESECTION OF BLADDER TUMOR WITH GEMCITABINE ;  Surgeon: Matilda Senior, MD;  Location: [facility] ORS;  Service: Urology;  Laterality: N/A;   TRANSURETHRAL RESECTION OF BLADDER TUMOR WITH MITOMYCIN -C N/A 04/12/2022   Procedure: REPEAT TRANSURETHRAL RESECTION OF BLADDER TUMOR WITH GEMCITABINE ;  Surgeon: Matilda Senior, MD;  Location: [facility];  Service: Urology;  Laterality: N/A;   TRANSURETHRAL RESECTION OF BLADDER TUMOR WITH MITOMYCIN -C N/A 08/16/2023   Procedure: TRANSURETHRAL RESECTION OF BLADDER TUMOR WITH INSTILLATION OF GEMCITABINE ;  Surgeon: Selma Donnice SAUNDERS, MD;  Location: [facility];  Service: Urology;  Laterality: N/A;    FAMILY HISTORY: History reviewed. No pertinent family history.  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[employer], Hydrologist, Holiday representative  Tobacco Use   Smoking status: Never   Smokeless tobacco: Never  Vaping Use   Vaping status: Never Used  Substance and Sexual Activity   Alcohol use: Yes    Comment: rarely   Drug use: No   Sexual activity: Not on file  Other Topics Concern   Not on file  Social History Narrative   Resides with [place] at [place]   Social Drivers of Health   Financial Resource Strain: Low Risk  (01/10/2024)   Received [facility]   Overall Financial Resource Strain (CARDIA)    Difficulty of Paying Living Expenses: Not hard at  all  Food Insecurity: No Food Insecurity (01/10/2024)   [facility]   Hunger Vital Sign    Within the past 12 months, the food you bought just didn't last and you didn't have money to get more.: Never true    Within the past 12 months, you worried that your food would run out before you got the money to buy more.: Never true  Transportation Needs: No Transportation Needs (01/10/2024)   [facility] from [facility]   PRAPARE - Transportation    Lack of Transportation (Non-Medical): No    In the past 12 months, has lack of transportation kept you from medical appointments or from getting medications?: No  Physical Activity: Not on file  Stress: Not on file  Social Connections: Not on file  Intimate Partner Violence: Not on file     PHYSICAL EXAM  GENERAL EXAM/CONSTITUTIONAL: Vitals:  Vitals:   05/04/24 0847  BP: 128/83  Pulse: 60  Weight: 257 lb (116.6 kg)  Height: 5' 9 (1.753 m)   Body mass index is 37.95 kg/m. Wt Readings from Last 3 Encounters:  05/04/24 257 lb (116.6 kg)  08/16/23 233 lb 3.2 oz (105.8 kg)  04/12/22 241 lb 3.2 oz (109.4 kg)   Patient is in no distress; well developed, nourished and groomed; neck is supple  CARDIOVASCULAR: Examination of carotid arteries is normal; no carotid  bruits Regular rate and rhythm, no murmurs Examination of peripheral vascular system by observation and palpation is normal  EYES: Ophthalmoscopic exam of optic discs and posterior segments is normal; no papilledema or hemorrhages No results found.  MUSCULOSKELETAL: Gait, strength, tone, movements noted in Neurologic exam below  NEUROLOGIC: MENTAL STATUS:      No data to display         awake, alert, oriented to person, place and time recent and remote memory intact normal attention and concentration language fluent, comprehension intact, naming intact fund of knowledge appropriate  CRANIAL NERVE:  2nd - no papilledema on fundoscopic exam 2nd, 3rd, 4th, 6th - pupils equal and reactive to light, visual fields full to confrontation, extraocular muscles intact, no nystagmus 5th - facial sensation symmetric 7th - facial strength symmetric 8th - hearing intact 9th - palate elevates symmetrically, uvula midline 11th - shoulder shrug symmetric 12th - tongue protrusion midline  MOTOR:  normal bulk and tone, full strength in the BUE, BLE; EXCEPT ATROPHY AND WEAKNESS IN BILATERAL APB  SENSORY:  normal and symmetric to light touch, temperature, vibration; EXCEPT DECR IN BILATERAL FINGERTIPS DIGIT #2 NEG PHALENS  COORDINATION:  finger-nose-finger, fine finger movements normal  REFLEXES:  deep tendon reflexes TRACE and symmetric  GAIT/STATION:  narrow based gait     DIAGNOSTIC DATA (LABS, IMAGING, TESTING) - I reviewed patient records, labs, notes, testing and imaging myself where available.  Lab Results  Component Value Date   WBC 8.4 02/28/2022   HGB 15.3 08/16/2023   HCT 45.0 08/16/2023   MCV 96.2 02/28/2022   PLT 251 02/28/2022      Component Value Date/Time   NA 141 08/16/2023 1033   NA 145 07/14/2014 0429   K 3.5 08/16/2023 1033   K 3.5 07/14/2014 0429   CL 102 08/16/2023 1033   CL 111 (H) 07/14/2014 0429   CO2 28 02/28/2022 1332   CO2 29 07/14/2014  0429   GLUCOSE 105 (H) 08/16/2023 1033   GLUCOSE 91 07/14/2014 0429   BUN 18 08/16/2023 1033   BUN 11 07/14/2014  0429   CREATININE 0.70 08/16/2023 1033   CREATININE 0.69 07/14/2014 0429   CALCIUM  10.2 02/28/2022 1332   CALCIUM  7.5 (L) 07/14/2014 0429   PROT 6.5 10/06/2017 0402   PROT 5.6 (L) 07/12/2014 1524   ALBUMIN 3.8 10/06/2017 0402   ALBUMIN 3.0 (L) 07/12/2014 1524   AST 20 10/06/2017 0402   AST 21 07/12/2014 1524   ALT 19 10/06/2017 0402   ALT 25 07/12/2014 1524   ALKPHOS 75 10/06/2017 0402   ALKPHOS 52 07/12/2014 1524   BILITOT 0.8 10/06/2017 0402   BILITOT 0.4 07/12/2014 1524   GFRNONAA >60 02/28/2022 1332   GFRNONAA >60 07/14/2014 0429   GFRNONAA >60 02/21/2012 0538   GFRAA >60 10/06/2017 0402   GFRAA >60 07/14/2014 0429   GFRAA >60 02/21/2012 0538   Lab Results  Component Value Date   CHOL 166 10/06/2017   HDL 26 (L) 10/06/2017   LDLCALC 103 (H) 10/06/2017   TRIG 184 (H) 10/06/2017   CHOLHDL 6.4 10/06/2017   No results found for: HGBA1C No results found for: VITAMINB12 No results found for: TSH    ASSESSMENT AND PLAN  83 y.o. year old male here with:   Dx:  1. Pain in both hands     PLAN:  HAND PAIN, ARTHRITIS (rheumatoid) - per rheumatology; monitor for continued improvement  BILATERAL CARPAL TUNNEL SYNDROME (by clinical exam) - wrist splints at night; monitor for symptoms; patient not interested in surgery at this time and clinically I suspect his symptoms are more related to rheumatoid arthritis than carpal tunnel syndrome; recommend to continue to optimize rheumatoid arthritis and reevaluate in 3 to 6 months if carpal tunnel syndromes seem to increase and patient would consider surgery options  Return for pending if symptoms worsen or fail to improve, return to referring provider.    EDUARD FABIENE HANLON, MD 05/04/2024, 10:13 AM Certified in Neurology, Neurophysiology and Neuroimaging  [facility] [address]  [address] [place], [place] [address] [phone]

## 2024-05-06 ENCOUNTER — Encounter: Payer: Self-pay | Admitting: Diagnostic Neuroimaging

## 2024-06-10 NOTE — Telephone Encounter (Signed)
 Received email from [facility] stating reason for request was not documented. Robin RN updated form and updated form was emailed to Jodi at [facility]

## 2024-06-15 NOTE — Telephone Encounter (Signed)
 Received an email back from [employer] at the [facility] stating that the form needs to have the size and model number that we would like them to authorize. I spoke with Dr [name] who advised that this is not his speciality and would recommend David Jennings follow up with his PCP who may be able to measure and submit the relevant information to the [facility].  Reached out to patient and spoke with patients spouse, David Jennings. She was unavailable but she stated she would have him give us  a call back. I did relay the information to his wife, and she will have him give us  a call back with any questions.

## 2024-06-16 ENCOUNTER — Telehealth: Payer: Self-pay | Admitting: Diagnostic Neuroimaging

## 2024-06-16 NOTE — Telephone Encounter (Signed)
 Sonya from [facility] cld to request a size and specifics for Pt wrist splints. Informed Sonya the provider does not specialize in the splints and did not get measurements and has referred the Pt back to his PCP for fitting of wrist splints. Sonya voiced understanding.

## 2024-06-23 NOTE — Telephone Encounter (Signed)
 Error

## 2024-10-13 ENCOUNTER — Telehealth: Payer: Self-pay | Admitting: Diagnostic Neuroimaging

## 2024-10-13 NOTE — Telephone Encounter (Signed)
 I called the patient and went over previous note from the call room and his August office note. Patient wanted us  to fill out form for Community care to extend being covered for Neurology, but the reasoning was not related to the previous visit he had with Dr. [name]. His PCP referred him to a [facility] neurologist for nerve conduction for 10/07/24 and he cancelled the appointment because he did not know why he needed it and also did not want to be seen by [facility] neurology in [place] because he lives in [place] and wanted something closer.    I informed the patient that nerve conduction was not discussed in his visit with Dr. [name] and so I was unable to send documentation to community care for an extension not related to what he was seen for. Patient has a visit with his PCP 11/02/24 and plans to ask why she wanted a nerve conduction done and if she can refer him to our office to get it completed.   Patient's seen by a Rheumatologist for hand pain He has been on wrist splints for less than 6 months, which is what he was seen for by Dr.Penumalli

## 2024-10-13 NOTE — Telephone Encounter (Signed)
 Pt called to request to speak to  MD  about getting  [facility] . [facility] called stated that [facility] is request  for MD to  send off information for Pt to get community care extended . I Did inform Pt to follow up with PCP , but  pt would  l want to speak to Neurologist

## 2024-10-14 NOTE — Telephone Encounter (Signed)
 Called the patient back. He told me her never got the splints from the [facility] and was told by his provider he could get them OTC. I did go over places to get them and patient feels he does not need them at this time. He will inform PCP that nerve conduction is not recommended at this time.

## 2024-10-14 NOTE — Telephone Encounter (Signed)
 Pt called returning cal Informed that CMA will callback

## 2024-10-14 NOTE — Telephone Encounter (Signed)
 I called the patient to go over note by Dr. [name]. I left a message for the patient to call the office back.
